# Patient Record
Sex: Male | Born: 1960 | Race: White | Hispanic: No | Marital: Married | Smoking: Former smoker
Health system: Southern US, Community
[De-identification: ages and names within clinical notes are randomized; demographics above are authoritative.]

## PROBLEM LIST (undated history)

## (undated) DIAGNOSIS — T8859XA Other complications of anesthesia, initial encounter: Secondary | ICD-10-CM

## (undated) DIAGNOSIS — F419 Anxiety disorder, unspecified: Secondary | ICD-10-CM

## (undated) DIAGNOSIS — T4145XA Adverse effect of unspecified anesthetic, initial encounter: Secondary | ICD-10-CM

## (undated) DIAGNOSIS — E782 Mixed hyperlipidemia: Secondary | ICD-10-CM

## (undated) DIAGNOSIS — I499 Cardiac arrhythmia, unspecified: Secondary | ICD-10-CM

## (undated) DIAGNOSIS — Z9889 Other specified postprocedural states: Secondary | ICD-10-CM

## (undated) DIAGNOSIS — I480 Paroxysmal atrial fibrillation: Secondary | ICD-10-CM

## (undated) DIAGNOSIS — R112 Nausea with vomiting, unspecified: Secondary | ICD-10-CM

## (undated) HISTORY — PX: CHOLECYSTECTOMY: SHX55

## (undated) HISTORY — DX: Cardiac arrhythmia, unspecified: I49.9

## (undated) HISTORY — PX: BACK SURGERY: SHX140

## (undated) HISTORY — PX: TEE WITH CARDIOVERSION: SHX5442

---
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
## 2008-01-13 ENCOUNTER — Ambulatory Visit (HOSPITAL_COMMUNITY): Admission: RE | Admit: 2008-01-13 | Discharge: 2008-01-13 | Payer: Self-pay | Admitting: Neurosurgery

## 2008-03-01 ENCOUNTER — Encounter (INDEPENDENT_AMBULATORY_CARE_PROVIDER_SITE_OTHER): Payer: Self-pay | Admitting: Orthopedic Surgery

## 2008-03-01 ENCOUNTER — Inpatient Hospital Stay (HOSPITAL_COMMUNITY): Admission: RE | Admit: 2008-03-01 | Discharge: 2008-03-02 | Payer: Self-pay | Admitting: Orthopedic Surgery

## 2011-02-04 NOTE — Op Note (Signed)
NAMESHALIK, Aaron [name] NO.:  [id number]   MEDICAL RECORD NO.:  [id number]          PATIENT TYPE:  INP   LOCATION:  5017                         FACILITY:  [facility]   PHYSICIAN:  Nelda Severe, MD      DATE OF BIRTH:  [birth year]   DATE OF PROCEDURE:  03/01/2008  DATE OF DISCHARGE:                               OPERATIVE REPORT   SURGEON:  Nelda Severe, MD   ASSISTANT:  Lianne Cure, PA-C   PREOPERATIVE DIAGNOSIS:  L4-5 disk herniation with right sciatic pain.   POSTOPERATIVE DIAGNOSIS:  L4-5 disk herniation with right sciatic pain.   PROCEDURE:  Right L4-5 laminotomy and disk excision.   OPERATIVE NOTE:  The patient was placed under general endotracheal  anesthesia.  Intravenous antibiotics had been infused.  He was  positioned kneeling on an [place] frame.  Care was taken to position the  upper extremities so as to avoid hyperflexion and abduction of the  shoulders and so as to avoid hyperflexion of the elbows.  Axillary rolls  were placed (foam) and the upper extremities were padded with foam from  axilla to hands.  The kneeling support from the tibias was covered with  a gel pad.   I marked with a skin marker.  A vertical midline incision centered on  the what I perceived to be the L4-5 interspinous interval.  Lumbar air  strip was prepared using DuraPrep and draped in rectangular fashion and  the drapes secured with Ioban.   A time-out was held.  The patient was identified, the preoperative  diagnosis, intended procedure, and allergies were identified.   The skin was incised into the dermis and then the subcutaneous tissue  injected with 0.25% plain Marcaine mixed with 1% lidocaine with  epinephrine.  Incision was then deepened using cutting current through  into the thoracolumbar fascia which was detached from the spinous  processes of what proved to be L4-L5 medially.  Paraspinal muscles were  mobilized bilaterally to the facet joint.  A Kocher was  attached to the  trailing edge of the upper vertebra.  A cross-table lateral radiograph  taken which positively identified as the fourth lumbar vertebra.   A Taylor retractor was placed lateral to the L4-5 facet joint.  Soft  tissue was cleared away from the interlaminar space.   I then performed a modest medial facetectomy (inferior articular  process) and laminotomy proximally using a high-speed bur.  Angle Carlen  curette was then used to detach the ligamentum flavum from the upper  edge of the L5 vertebra and from the medial edge of the superior  articular process of L5.  This allowed to a 3-mm Kerrison to be admitted  into the spinal canal and to perform a modest medial facetectomy of the  superior articular process.  Ligamentum flavum was then further removed  using Kerrison rongeurs.   The origin of the L5 nerve root was then mobilized off a very prominent  knuckle of L4-5 disk.  The disk was incised and a moderate mallet of  very degenerate nucleus pulposus (multiple small fragments)  exuded into  the laminotomy.  A nerve hook was used to deliver as much as possible  and then Apstein and Scoville down pushing curettes were used to push a  little more disk into the disk space and to loosen up some disk within  the disk space which was then delivered using pituitary rongeur.  The  disk space was irrigated.  The Love nerve root retractor was removed.  The nerve root was palpated and was not under any compression or  tension.  A few epidural bleeders were coagulated.  There was one very  small bleeder which was right on the L5 nerve root, which I elected not  to coagulate.  I attempted to control it by placing Gelfoam on it for a  period of about 5 minutes, it continued to ooze.  This was very mild  ooze indeed.  However, as a prophylactic measure, I placed a 15-gauge  Blake drain subfascially and secured with it a 2-0 nylon suture to the  skin on the right side.  The fascia was  then closed using interrupted  and continuous #1 Vicryl suture.  The subcutaneous layer was closed  using inverted 2-0 undyed Vicryl.  The skin was closed using  subcuticular running 3-0 undyed Vicryl.  The skin edges were reinforced  with Steri-Strips.  An antibiotic ointment dressing was applied and  secured with OpSite.   There were no intraoperative complications.  Sponge and needle counts  were correct.   At the time of dictation, the patient was not awakened, so no neurologic  exam is reported here.      Nelda Severe, MD  Electronically Signed     MT/MEDQ  D:  03/01/2008  T:  03/02/2008  Job:  [id number]

## 2011-02-07 NOTE — Discharge Summary (Signed)
NAMERIAAN, TOLEDO                 ACCOUNT NO.:  [id number]   MEDICAL RECORD NO.:  [id number]          PATIENT TYPE:  INP   LOCATION:  5017                         FACILITY:  [facility]   PHYSICIAN:  Nelda Severe, MD      DATE OF BIRTH:  05/04/1961   DATE OF ADMISSION:  03/01/2008  DATE OF DISCHARGE:  03/02/2008                               DISCHARGE SUMMARY   ADMITTING DIAGNOSIS:  Lumbar herniated disk, L4-L5 right-sided.   Postoperatively, the patient was stable.  Right leg felt significantly  better.  No focal weakness noted distally.  Neurovascularly, motor is  intact.  Postop day #1, at 8:20 a.m., the patient states right leg feels  significantly better.  He is afebrile.  Vital signs were stable.  Drain  output 20 mL.  Drains were discontinued.  Dry dressing was applied.  Incision healing well.  Clean dry dressing for 24-48 hours until no  drainage is recommended.   DISCHARGE DIAGNOSIS:  L4-L5 right-sided herniated disk, status post  discectomy.   DISPOSITION:  Stable.   DIET:  Regular.   We are going to discharge him home on Norco 10 two every 4 p.r.n. for  pain control, Robaxin 500 mg every 6 hours for muscle spasms, ambulate  for exercise, and follow up with [place] in the office in approximately 4  weeks.  If he has troubles or concerns prior to that, he is welcome to  call us at anytime.      Lianne Cure, P.A.       Nelda Severe, MD  Electronically Signed    MC/MEDQ  D:  04/18/2008  T:  04/19/2008  Job:  [id number]

## 2011-06-19 LAB — URINE CULTURE
Colony Count: NO GROWTH
Culture: NO GROWTH

## 2011-06-19 LAB — URINALYSIS, ROUTINE W REFLEX MICROSCOPIC
Glucose, UA: NEGATIVE
Ketones, ur: NEGATIVE
Nitrite: NEGATIVE
Specific Gravity, Urine: 1.031 — ABNORMAL HIGH
pH: 5.5

## 2011-06-19 LAB — COMPREHENSIVE METABOLIC PANEL
Alkaline Phosphatase: 63
BUN: 10
CO2: 31
Chloride: 103
Creatinine, Ser: 0.94
GFR calc non Af Amer: >60
Potassium: 4.6
Total Bilirubin: 0.6

## 2011-06-19 LAB — PROTIME-INR
INR: 0.9
Prothrombin Time: 12.7

## 2011-06-19 LAB — URINE MICROSCOPIC-ADD ON

## 2011-06-19 LAB — DIFFERENTIAL
Basophils Absolute: 0.1
Basophils Relative: 0
Eosinophils Relative: 1
Lymphocytes Relative: 19
Neutro Abs: 8.7 — ABNORMAL HIGH

## 2011-06-19 LAB — CBC
HCT: 47.9
Hemoglobin: 16.5
MCHC: 34.4
MCV: 90.6
Platelets: 235
RBC: 5.28
RDW: 13.7
WBC: 12.4 — ABNORMAL HIGH

## 2011-06-19 LAB — ABO/RH: ABO/RH(D): O POS

## 2011-06-19 LAB — TYPE AND SCREEN
ABO/RH(D): O POS
Antibody Screen: NEGATIVE

## 2011-06-19 LAB — APTT: aPTT: 30

## 2011-11-17 ENCOUNTER — Other Ambulatory Visit: Payer: Self-pay | Admitting: Orthopedic Surgery

## 2011-11-17 DIAGNOSIS — M545 Low back pain: Secondary | ICD-10-CM

## 2011-11-19 ENCOUNTER — Ambulatory Visit
Admission: RE | Admit: 2011-11-19 | Discharge: 2011-11-19 | Disposition: A | Discharge status: Home / Self Care | Payer: PRIVATE HEALTH INSURANCE | Source: Ambulatory Visit | Attending: Orthopedic Surgery | Admitting: Orthopedic Surgery

## 2011-11-19 DIAGNOSIS — M545 Low back pain: Secondary | ICD-10-CM

## 2011-11-19 MED ORDER — GADOBENATE DIMEGLUMINE 529 MG/ML IV SOLN
20.0000 mL | Freq: Once | INTRAVENOUS | Status: AC | PRN
  Administered 2011-11-19: 20 mL via INTRAVENOUS

## 2018-10-18 DIAGNOSIS — R112 Nausea with vomiting, unspecified: Secondary | ICD-10-CM | POA: Diagnosis not present

## 2018-10-18 DIAGNOSIS — Z6838 Body mass index (BMI) 38.0-38.9, adult: Secondary | ICD-10-CM | POA: Diagnosis not present

## 2018-10-18 DIAGNOSIS — K6289 Other specified diseases of anus and rectum: Secondary | ICD-10-CM | POA: Diagnosis not present

## 2018-10-29 DIAGNOSIS — Z6838 Body mass index (BMI) 38.0-38.9, adult: Secondary | ICD-10-CM | POA: Diagnosis not present

## 2018-10-29 DIAGNOSIS — R7303 Prediabetes: Secondary | ICD-10-CM | POA: Diagnosis not present

## 2018-10-29 DIAGNOSIS — Z87891 Personal history of nicotine dependence: Secondary | ICD-10-CM | POA: Diagnosis not present

## 2018-10-29 DIAGNOSIS — Z0000 Encounter for general adult medical examination without abnormal findings: Secondary | ICD-10-CM | POA: Diagnosis not present

## 2018-11-04 DIAGNOSIS — J209 Acute bronchitis, unspecified: Secondary | ICD-10-CM | POA: Diagnosis not present

## 2018-11-04 DIAGNOSIS — Z6838 Body mass index (BMI) 38.0-38.9, adult: Secondary | ICD-10-CM | POA: Diagnosis not present

## 2018-11-17 ENCOUNTER — Encounter: Payer: Self-pay | Admitting: Internal Medicine

## 2018-11-17 DIAGNOSIS — Z87891 Personal history of nicotine dependence: Secondary | ICD-10-CM | POA: Diagnosis not present

## 2019-01-05 ENCOUNTER — Institutional Professional Consult (permissible substitution): Payer: PRIVATE HEALTH INSURANCE | Admitting: Pulmonary Disease

## 2019-01-25 ENCOUNTER — Other Ambulatory Visit: Payer: Self-pay

## 2019-01-25 ENCOUNTER — Encounter: Payer: Self-pay | Admitting: Internal Medicine

## 2019-01-25 ENCOUNTER — Ambulatory Visit: Payer: 59 | Admitting: Internal Medicine

## 2019-01-25 VITALS — BP 126/74 | HR 68 | Temp 97.9°F | Ht 72.0 in | Wt 276.0 lb

## 2019-01-25 DIAGNOSIS — R918 Other nonspecific abnormal finding of lung field: Secondary | ICD-10-CM | POA: Diagnosis not present

## 2019-01-25 DIAGNOSIS — J449 Chronic obstructive pulmonary disease, unspecified: Secondary | ICD-10-CM

## 2019-01-25 NOTE — Patient Instructions (Signed)
Weight control is simply a matter of calorie balance which needs to be tilted in your favor by eating less and exercising more.  To get the most out of exercise, you need to be continuously aware that you are short of breath, but never out of breath, for 30 minutes daily. As you improve, it will actually be easier for you to do the same amount of exercise  in  30 minutes so always push to the level where you are short of breath.  If this does not result in gradual weight reduction then I strongly recommend you see a nutritionist with a food diary x 2 weeks so that we can work out a negative calorie balance which is universally effective in steady weight loss programs.  Think of your calorie balance like you do your bank account where in this case you want the balance to go down so you must take in less calories than you burn up.  It's just that simple:  Hard to do, but easy to understand.  Good luck!    Return after Labor day for full pfts and to review your follow up CT

## 2019-01-25 NOTE — Progress Notes (Signed)
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 Pulmonary Consult    Referred by Laverna Peace, NP. Pt c/o DOE for the past year. He gets winded walking up hill or stairs.   Dyspnea:  MMRC2 = can't walk a nl pace on a flat grade s sob but does fine slow and flat / one flight sob at top s stopping / still doing yardwork for 10 min Cough: none Sleep: on side / bed is flat/ one pillow  SABA use: none    No obvious day to day or daytime variability or assoc excess/ purulent sputum or mucus plugs or hemoptysis or cp or chest tightness, subjective wheeze or overt sinus or hb symptoms.   Sleeping as above without nocturnal  or early am exacerbation  of respiratory  c/o's or need for noct saba. Also denies any obvious fluctuation of symptoms with weather or environmental changes or other aggravating or alleviating factors except as outlined above   No unusual exposure hx or h/o childhood pna/ asthma or knowledge of premature birth.  Current Allergies, Complete Past Medical History, Past Surgical History, Family History, and Social History were reviewed in [employer] record.  ROS  The following are not active complaints unless bolded Hoarseness, sore throat, dysphagia, dental problems, itching, sneezing,  nasal congestion or discharge of excess mucus or purulent secretions, ear ache,   fever, chills, sweats, unintended wt loss or wt gain, classically  pleuritic or exertional cp,  orthopnea pnd or arm/hand swelling  or leg swelling, presyncope, palpitations, abdominal pain, anorexia, nausea, vomiting, diarrhea  or change in bowel habits or change in bladder habits, change in stools or change in urine, dysuria, hematuria,  rash, arthralgias, visual complaints, headache, numbness, weakness or ataxia or problems with walking or coordination,  change in mood= anxious or  memory.             No past medical history on file.  Outpatient Medications Prior to Visit  Medication Sig Dispense Refill  . atorvastatin (LIPITOR) 10 MG tablet Take 10 mg by mouth daily.    . temazepam (RESTORIL) 15 MG capsule Take 1 capsule by mouth daily.    Marland Kitchen escitalopram (LEXAPRO) 10 MG tablet Take 1 tablet by mouth daily.        Objective:     BP 126/74 (BP Location: Left Arm, Cuff Size: Normal)   Pulse 68   Temp 97.9 F (36.6 C) (Oral)   Ht 6' (1.829 m)   Wt 276 lb (125.2 kg)   SpO2 99%   BMI 37.43 kg/m   SpO2: 99 %  RA  Pleasant amb wm nad   HEENT: nl   oropharynx. Nl external ear canals without cough reflex -  Mild bilateral non-specific turbinate edema     NECK :  without JVD/Nodes/TM/ nl carotid upstrokes bilaterally   LUNGS: no acc muscle use,  Mild barrel  contour chest wall with bilateral  Distant bs s audible wheeze and  without cough on insp or exp maneuver and mild  Hyperresonant  to  percussion bilaterally     CV:  RRR  no s3 or murmur or increase in P2, and no edema   ABD:  soft and nontender with pos end  insp Hoover's  in the supine position. No bruits or organomegaly appreciated, bowel sounds nl  MS:   Nl gait/  ext warm without deformities, calf tenderness, cyanosis or clubbing No obvious joint restrictions   SKIN: warm and dry without lesions    NEURO:  alert, approp, nl sensorium with  no motor or cerebellar deficits apparent.     I personally reviewed images and agree with radiology impression as follows:   Chest CT  as above from 11/17/2018         Assessment   COPD  by ldsct criteria only  Quit smoking 2015 at wt = 170 lb  - 01/25/2019   Walked RA  2 laps @  approx 210ft each @ fast pace  stopped due to  [place] at wt = 276     I reviewed the Fletcher curve with the patient that basically indicates  if you quit smoking when your best day FEV1 is still well preserved (as is likely   the case here)  it is highly unlikely you will progress to severe disease and informed the patient there was  no medication on the market that has proven to alter the curve/ its downward trajectory  or the likelihood of progression of their disease(unlike other chronic medical conditions such as atheroclerosis where we do think we can change the natural hx with risk reducing meds like his lipitor)    Therefore stopping smoking and maintaining abstinence are  the most important aspects of care, not choice of inhalers or for that matter, doctors.   Treatment other than smoking cessation  is entirely directed by severity of symptoms and focused also on reducing exacerbations, not attempting to change the natural history of the disease.  He might be a candidate for a trial of either a lama   or Lama/laba combination but is not interested at this point.  I have asked him to return for full PFTs after  COVID - 19 restrictions have been lifted.       Abnormal CT lung screening CT results reviewed with pt >>> Too small for PET or bx, not suspicious enough for excisional bx > really only option for now is follow the Fleischner society guidelines as rec by radiology.   As for the tree in bud changes, This is an extremely common benign condition and does not warrant aggressive eval/ rx at this point unless there is a clinical correlation suggesting unaddressed pulmonary infection (purulent sputum, night sweats, unintended wt loss, doe) or evolution of  obvious changes on plain cxr (as opposed to serial CT, which is way over sensitive to  make clinical decisions re intervention.  Since needs CT for SPN can ollow-up this also in 6 months as planned but would not act on changes that are only seen on CT chest s any clinical correlation.   Discussed in detail all the  indications, usual  risks and alternatives  relative to the benefits with patient who agrees to proceed with conservative f/u as outlined          Total time devoted to counseling  > 50 % of  initial 60 min office visit:  review case with pt/ directly observed portions of ambulatory 02 saturation study/  discussion of options/alternatives/ personally creating written customized instructions  in presence of pt  then going over those specific  Instructions directly with the pt including how to use all of the meds but in particular covering each new medication in detail and the difference between the maintenance= "automatic" meds and the prns using an action plan format for the latter (If this problem/symptom => do that organization reading Left to right).  Please see AVS from this visit for a full list of these instructions which I personally wrote for this pt and  are unique to this visit.      Christinia Gully, MD 01/25/2019

## 2019-01-26 ENCOUNTER — Encounter: Payer: Self-pay | Admitting: Internal Medicine

## 2019-01-26 DIAGNOSIS — R918 Other nonspecific abnormal finding of lung field: Secondary | ICD-10-CM

## 2019-01-26 DIAGNOSIS — J449 Chronic obstructive pulmonary disease, unspecified: Secondary | ICD-10-CM

## 2019-01-26 DIAGNOSIS — R911 Solitary pulmonary nodule: Secondary | ICD-10-CM | POA: Insufficient documentation

## 2019-01-26 HISTORY — DX: Solitary pulmonary nodule: R91.1

## 2019-01-26 HISTORY — DX: Chronic obstructive pulmonary disease, unspecified: J44.9

## 2019-01-26 NOTE — Assessment & Plan Note (Signed)
CT results reviewed with pt >>> Too small for PET or bx, not suspicious enough for excisional bx > really only option for now is follow the Fleischner society guidelines as rec by radiology.   As for the tree in bud changes, This is an extremely common benign condition and does not warrant aggressive eval/ rx at this point unless there is a clinical correlation suggesting unaddressed pulmonary infection (purulent sputum, night sweats, unintended wt loss, doe) or evolution of  obvious changes on plain cxr (as opposed to serial CT, which is way over sensitive to make clinical decisions re intervention.  Since needs CT for SPN can ollow-up this also in 6 months as planned but would not act on changes that are only seen on CT chest s any clinical correlation.   Discussed in detail all the  indications, usual  risks and alternatives  relative to the benefits with patient who agrees to proceed with conservative f/u as outlined       Total time devoted to counseling  > 50 % of initial 60 min office visit:  review case with pt/ directly observed portions of ambulatory 02 saturation study/  discussion of options/alternatives/ personally creating written customized instructions  in presence of pt  then going over those specific  Instructions directly with the pt including how to use all of the meds but in particular covering each new medication in detail and the difference between the maintenance= "automatic" meds and the prns using an action plan format for the latter (If this problem/symptom => do that organization reading Left to right).  Please see AVS from this visit for a full list of these instructions which I personally wrote for this pt and  are unique to this visit.

## 2019-01-26 NOTE — Assessment & Plan Note (Addendum)
Quit smoking 2015 at wt = 170 lb  - 01/25/2019   Walked RA  2 laps @  approx 234ft each @ fast pace  stopped due to  [place] at wt = 276     I reviewed the Fletcher curve with the patient that basically indicates  if you quit smoking when your best day FEV1 is still well preserved (as is likely   the case here)  it is highly unlikely you will progress to severe disease and informed the patient there was  no medication on the market that has proven to alter the curve/ its downward trajectory  or the likelihood of progression of their disease(unlike other chronic medical conditions such as atheroclerosis where we do think we can change the natural hx with risk reducing meds like his lipitor)    Therefore stopping smoking and maintaining abstinence are  the most important aspects of care, not choice of inhalers or for that matter, doctors.   Treatment other than smoking cessation  is entirely directed by severity of symptoms and focused also on reducing exacerbations, not attempting to change the natural history of the disease.  He might be a candidate for a trial of either a lama or lama/laba combination but is not interested at this point.  I have asked him to return for full PFTs after  COVID - 19 restrictions have been lifted.

## 2019-02-08 DIAGNOSIS — I4891 Unspecified atrial fibrillation: Secondary | ICD-10-CM

## 2019-02-08 DIAGNOSIS — R079 Chest pain, unspecified: Secondary | ICD-10-CM | POA: Diagnosis not present

## 2019-02-08 DIAGNOSIS — R002 Palpitations: Secondary | ICD-10-CM | POA: Diagnosis not present

## 2019-02-09 DIAGNOSIS — I4891 Unspecified atrial fibrillation: Secondary | ICD-10-CM

## 2019-02-11 DIAGNOSIS — Z79899 Other long term (current) drug therapy: Secondary | ICD-10-CM | POA: Diagnosis not present

## 2019-02-11 DIAGNOSIS — Z6837 Body mass index (BMI) 37.0-37.9, adult: Secondary | ICD-10-CM | POA: Diagnosis not present

## 2019-02-11 DIAGNOSIS — I4891 Unspecified atrial fibrillation: Secondary | ICD-10-CM | POA: Diagnosis not present

## 2019-02-16 ENCOUNTER — Other Ambulatory Visit: Payer: Self-pay

## 2019-02-16 ENCOUNTER — Ambulatory Visit (INDEPENDENT_AMBULATORY_CARE_PROVIDER_SITE_OTHER): Payer: 59 | Admitting: Cardiology

## 2019-02-16 ENCOUNTER — Encounter: Payer: Self-pay | Admitting: Cardiology

## 2019-02-16 VITALS — BP 118/72 | HR 65 | Ht 72.0 in | Wt 280.0 lb

## 2019-02-16 DIAGNOSIS — G4700 Insomnia, unspecified: Secondary | ICD-10-CM | POA: Insufficient documentation

## 2019-02-16 DIAGNOSIS — Z7901 Long term (current) use of anticoagulants: Secondary | ICD-10-CM

## 2019-02-16 DIAGNOSIS — Z5181 Encounter for therapeutic drug level monitoring: Secondary | ICD-10-CM | POA: Diagnosis not present

## 2019-02-16 DIAGNOSIS — I4891 Unspecified atrial fibrillation: Secondary | ICD-10-CM | POA: Diagnosis not present

## 2019-02-16 HISTORY — DX: Insomnia, unspecified: G47.00

## 2019-02-16 NOTE — Progress Notes (Signed)
Cardiology Office Note:    Date:  02/16/2019   ID:  Aaron [name], DOB 05/21/1961, MRN [id number]  PCP:  Lowella Dandy, NP  Cardiologist:  Jenean Lindau, MD   Referring MD: Lowella Dandy, NP    ASSESSMENT:    1. Atrial fibrillation, unspecified type (Central)    PLAN:    In order of problems listed above:  1. I discussed my findings with the patient at extensive length.I discussed with the patient atrial fibrillation, disease process. Management and therapy including rate and rhythm control, anticoagulation benefits and potential risks were discussed extensively with the patient. Patient had multiple questions which were answered to patient's satisfaction.  Patient's [place] score is 0 but he is now on anticoagulation with Eliquis 5 mg twice daily.  Echocardiogram will be done to assess systolic function and left atrial size with review on rhythm control.  If his cardiac anatomy is well preserved and left atrial sizes acceptable then we will initiate him on propafenone in 2 weeks from now.  This will hopefully give him a chance for a pharmacological cardioversion.  I discussed with him that if this is unsuccessful he might need electrocardioversion as the next heparin he vocalized understanding.  He had multiple questions which were answered to his satisfaction.  I would also like to give him a ifob as he is on anticoagulation to check stool for occult blood.  He knows to go to the nearest emergency room for any concerning symptoms.   Medication Adjustments/Labs and Tests Ordered: Current medicines are reviewed at length with the patient today.  Concerns regarding medicines are outlined above.  Orders Placed This Encounter  Procedures  . EKG 12-Lead   No orders of the defined types were placed in this encounter.    History of Present Illness:    Aaron [name] is a 58 y.o. male who is being seen today for the evaluation of newly diagnosed atrial fibrillation at the request of Moon, [name] A, NP.   Patient is a pleasant 58 year old male.  He denies any past medical history of essential hypertension dyslipidemia or diabetes mellitus.  He felt that his heart rate was elevated and he was getting exhausted and went to the emergency room about a week ago.  He was admitted.  He underwent stress testing which was negative for ischemia.  No echo report was available and I do not think an echo was done.  Subsequently he was discharged home.  Patient mentions to me that at rest he has no symptoms.  But with ambulation he develops fatigue.  At the time of my evaluation, the patient is alert awake oriented and in no distress.  History reviewed. No pertinent past medical history.  Past Surgical History:  Procedure Laterality Date  . BACK SURGERY    . CHOLECYSTECTOMY      Current Medications: Current Meds  Medication Sig  . atorvastatin (LIPITOR) 10 MG tablet Take 10 mg by mouth daily.  Marland Kitchen diltiazem (CARDIZEM) 60 MG tablet Take 60 mg by mouth every 12 (twelve) hours.  Marland Kitchen ELIQUIS 5 MG TABS tablet Take 5 mg by mouth 2 (two) times daily.  Marland Kitchen escitalopram (LEXAPRO) 10 MG tablet Take 1 tablet by mouth daily.  . temazepam (RESTORIL) 15 MG capsule Take 1 capsule by mouth daily.     Allergies:   Codeine and Penicillins   Social History   Socioeconomic History  . Marital status: Married    Spouse name: Not on file  .  Number of children: Not on file  . Years of education: Not on file  . Highest education level: Not on file  Occupational History  . Not on file  Social Needs  . Financial resource strain: Not on file  . Food insecurity:    Worry: Not on file    Inability: Not on file  . Transportation needs:    Medical: Not on file    Non-medical: Not on file  Tobacco Use  . Smoking status: Former Smoker    Packs/day: 1.50    Years: 40.00    Pack years: 60.00    Types: Cigarettes    Last attempt to quit: 09/22/2013    Years since quitting: 5.4  . Smokeless tobacco: Never Used  Substance and  Sexual Activity  . Alcohol use: Not on file  . Drug use: Not on file  . Sexual activity: Not on file  Lifestyle  . Physical activity:    Days per week: Not on file    Minutes per session: Not on file  . Stress: Not on file  Relationships  . Social connections:    Talks on phone: Not on file    Gets together: Not on file    Attends religious service: Not on file    Active member of club or organization: Not on file    Attends meetings of clubs or organizations: Not on file    Relationship status: Not on file  Other Topics Concern  . Not on file  Social History Narrative  . Not on file     Family History: The patient's family history includes Lung cancer in his brother, father, mother, and sister.  ROS:   Please see the history of present illness.    All other systems reviewed and are negative.  EKGs/Labs/Other Studies Reviewed:    The following studies were reviewed today: I reviewed [facility] records extensively.  Lexiscan sestamibi done on 02/08/2019 reveals no evidence of ischemia and normal ejection fraction. EKG done today reveals atrial fibrillation with elevated ventricular rate of 102.  Recent Labs: No results found for requested labs within last 8760 hours.  Recent Lipid Panel No results found for: CHOL, TRIG, HDL, CHOLHDL, VLDL, LDLCALC, LDLDIRECT  Physical Exam:    VS:  BP 118/72 (BP Location: Left Arm, Patient Position: Sitting, Cuff Size: Normal)   Pulse 65   Ht 6' (1.829 m)   Wt 280 lb (127 kg)   SpO2 98%   BMI 37.97 kg/m     Wt Readings from Last 3 Encounters:  02/16/19 280 lb (127 kg)  01/25/19 276 lb (125.2 kg)     GEN: Patient is in no acute distress HEENT: Normal NECK: No JVD; No carotid bruits LYMPHATICS: No lymphadenopathy CARDIAC: S1 S2 regular, 2/6 systolic murmur at the apex. RESPIRATORY:  Clear to auscultation without rales, wheezing or rhonchi  ABDOMEN: Soft, non-tender, non-distended MUSCULOSKELETAL:  No edema; No  deformity  SKIN: Warm and dry NEUROLOGIC:  Alert and oriented x 3 PSYCHIATRIC:  Normal affect    Signed, Jenean Lindau, MD  02/16/2019 10:39 AM    [place]

## 2019-02-16 NOTE — Patient Instructions (Addendum)
Medication Instructions:  Your physician recommends that you continue on your current medications as directed. Please refer to the Current Medication list given to you today.  If you need a refill on your cardiac medications before your next appointment, please call your pharmacy.   Lab work: You will be given an IFOB test to take home with instructions. If you have labs (blood work) drawn today and your tests are completely normal, you will receive your results only by: Marland Kitchen MyChart Message (if you have MyChart) OR . A paper copy in the mail If you have any lab test that is abnormal or we need to change your treatment, we will call you to review the results.  Testing/Procedures: You had an EKG performed today.  Your physician has requested that you have an echocardiogram. YOU HAVE been scheduled for March 07, 2019 at 3:15 pm. Echocardiography is a painless test that uses sound waves to create images of your heart. It provides your doctor with information about the size and shape of your heart and how well your heart's chambers and valves are working. This procedure takes approximately one hour. There are no restrictions for this procedure.    Follow-Up: At [facility], you and your health needs are our priority.  As part of our continuing mission to provide you with exceptional heart care, we have created designated Provider Care Teams.  These Care Teams include your primary Cardiologist (physician) and Advanced Practice Providers (APPs -  Physician Assistants and Nurse Practitioners) who all work together to provide you with the care you need, when you need it. You will need a follow up appointment in 2 weeks.

## 2019-02-18 DIAGNOSIS — I4891 Unspecified atrial fibrillation: Secondary | ICD-10-CM | POA: Diagnosis not present

## 2019-02-18 DIAGNOSIS — Z6837 Body mass index (BMI) 37.0-37.9, adult: Secondary | ICD-10-CM | POA: Diagnosis not present

## 2019-02-19 LAB — FECAL OCCULT BLOOD, IMMUNOCHEMICAL: Fecal Occult Bld: POSITIVE — AB

## 2019-02-25 ENCOUNTER — Telehealth: Payer: Self-pay

## 2019-02-25 NOTE — Telephone Encounter (Signed)
Patient informed and will await call from Dr. Louann Sjogren office for appt. RN called Aaron [name] at Dr. Louann Sjogren office to evaluate discontinuing Eliquis.Informed to send office note to Dr. Docia Furl attention after judgement has been made concerning discontinuation of Eliquis.

## 2019-02-25 NOTE — Telephone Encounter (Signed)
-----   Message from Jenean Lindau, MD sent at 02/21/2019  1:28 PM EDT ----- Please let the patient know of the results.  Please contact primary care doctor's nurse and let them know that they need to evaluate him.  We need to primary care to advise [place] whether we need to stop Eliquis.  He does not have to be on Eliquis and if necessary we can stop it.  Please do this and get back to me.  Thank you. ----- Message ----- From: Lavone Neri Lab Results In Sent: 02/19/2019  11:36 AM EDT To: Jenean Lindau, MD

## 2019-03-07 ENCOUNTER — Ambulatory Visit (INDEPENDENT_AMBULATORY_CARE_PROVIDER_SITE_OTHER): Payer: 59

## 2019-03-07 ENCOUNTER — Other Ambulatory Visit: Payer: Self-pay

## 2019-03-07 DIAGNOSIS — I4891 Unspecified atrial fibrillation: Secondary | ICD-10-CM

## 2019-03-07 NOTE — Progress Notes (Signed)
2D Echocardiogram performed  03/07/19 Aaron [name]

## 2019-03-09 ENCOUNTER — Other Ambulatory Visit: Payer: Self-pay

## 2019-03-09 ENCOUNTER — Telehealth (INDEPENDENT_AMBULATORY_CARE_PROVIDER_SITE_OTHER): Payer: 59 | Admitting: Cardiology

## 2019-03-09 ENCOUNTER — Telehealth: Payer: Self-pay | Admitting: *Deleted

## 2019-03-09 ENCOUNTER — Encounter: Payer: Self-pay | Admitting: Cardiology

## 2019-03-09 VITALS — Ht 72.0 in | Wt 175.0 lb

## 2019-03-09 DIAGNOSIS — I4891 Unspecified atrial fibrillation: Secondary | ICD-10-CM

## 2019-03-09 NOTE — Progress Notes (Signed)
Virtual Visit via Video Note   This visit type was conducted due to national recommendations for restrictions regarding the COVID-19 Pandemic (e.g. social distancing) in an effort to limit this patient's exposure and mitigate transmission in our community.  Due to his co-morbid illnesses, this patient is at least at moderate risk for complications without adequate follow up.  This format is felt to be most appropriate for this patient at this time.  All issues noted in this document were discussed and addressed.  A limited physical exam was performed with this format.  Please refer to the patient's chart for his consent to telehealth for [facility]   Date:  03/09/2019   ID:  Aaron [name], DOB 01-17-1961, MRN [id number]  Patient Location: Home Provider Location: Home  PCP:  Lowella Dandy, NP  Cardiologist:  No primary care provider on file.  Electrophysiologist:  None   Evaluation Performed:  Follow-Up Visit  Chief Complaint: Atrial fibrillation  History of Present Illness:    Aaron [name] is a 58 y.o. male with recently diagnosed atrial fibrillation.  Patient was in the hospital stress testing which was unremarkable.  There was no evidence of ischemia and ejection fraction was normal.  Echocardiogram done yesterday has revealed preserved systolic function and mild left atrial enlargement.  Patient mentions to me that when he exerts himself he feels short of breath.  At the time of my evaluation, the patient is alert awake oriented and in no distress.  The patient does not have symptoms concerning for COVID-19 infection (fever, chills, cough, or new shortness of breath).    History reviewed. No pertinent past medical history. Past Surgical History:  Procedure Laterality Date   BACK SURGERY     CHOLECYSTECTOMY       Current Meds  Medication Sig   atorvastatin (LIPITOR) 10 MG tablet Take 10 mg by mouth daily.   diltiazem (CARDIZEM) 60 MG tablet Take 60 mg by mouth every 12  (twelve) hours.   ELIQUIS 5 MG TABS tablet Take 5 mg by mouth 2 (two) times daily.   escitalopram (LEXAPRO) 10 MG tablet Take 1 tablet by mouth daily.   temazepam (RESTORIL) 15 MG capsule Take 1 capsule by mouth daily.     Allergies:   Codeine and Penicillins   Social History   Tobacco Use   Smoking status: Former Smoker    Packs/day: 1.50    Years: 40.00    Pack years: 60.00    Types: Cigarettes    Quit date: 09/22/2013    Years since quitting: 5.4   Smokeless tobacco: Never Used  Substance Use Topics   Alcohol use: Not on file   Drug use: Not on file     Family Hx: The patient's family history includes Lung cancer in his brother, father, mother, and sister.  ROS:   Please see the history of present illness.    As mentioned above All other systems reviewed and are negative.   Prior CV studies:   The following studies were reviewed today:  IMPRESSIONS    1. The left ventricle has normal systolic function, with an ejection fraction of 55-60%. The cavity size was normal. Left ventricular diastolic Doppler parameters are indeterminate.  2. The right ventricle has normal systolic function. The cavity was normal. There is no increase in right ventricular wall thickness.  3. Left atrial size was moderately dilated.  4. Right atrial size was mildly dilated.  5. The aortic valve has an indeterminate number of  cusps. Aortic valve regurgitation was not assessed by color flow Doppler.  Labs/Other Tests and Data Reviewed:    EKG:  No ECG reviewed.  Recent Labs: No results found for requested labs within last 8760 hours.   Recent Lipid Panel No results found for: CHOL, TRIG, HDL, CHOLHDL, LDLCALC, LDLDIRECT  Wt Readings from Last 3 Encounters:  03/09/19 175 lb (79.4 kg)  02/16/19 280 lb (127 kg)  01/25/19 276 lb (125.2 kg)     Objective:    Vital Signs:  Ht 6' (1.829 m)    Wt 175 lb (79.4 kg)    BMI 23.73 kg/m    VITAL SIGNS:  reviewed  ASSESSMENT & PLAN:     1. Atrial fibrillation:I discussed with the patient atrial fibrillation, disease process. Management and therapy including rate and rhythm control, anticoagulation benefits and potential risks were discussed extensively with the patient. Patient had multiple questions which were answered to patient's satisfaction. 2. The patient's heart rates are fairly well controlled.  He does have shortness of breath on exertion.  Echocardiogram was discussed in detail with the patient.  His stools were positive for occult blood however his primary care physician has asked him to continue his anticoagulation because he has hemorrhoids and most likely this was the reason for blood in the stools.  I respect their wishes.  Patient will continue current medications.  I will obtain copy of lab work from primary care physician's office and begin the patient on antiarrhythmic medications.  Accordingly advice will be given to the patient.  He will be seen in follow-up appointment in 2 weeks or earlier if he has any concerns.  COVID-19 Education: The signs and symptoms of COVID-19 were discussed with the patient and how to seek care for testing (follow up with PCP or arrange E-visit).  The importance of social distancing was discussed today.  Time:   Today, I have spent 15 minutes with the patient with telehealth technology discussing the above problems.     Medication Adjustments/Labs and Tests Ordered: Current medicines are reviewed at length with the patient today.  Concerns regarding medicines are outlined above.   Tests Ordered: No orders of the defined types were placed in this encounter.   Medication Changes: No orders of the defined types were placed in this encounter.   Follow Up:  Virtual Visit or In Person in 2 week(s)  Signed, Jenean Lindau, MD  03/09/2019 8:48 AM    [place]

## 2019-03-09 NOTE — Patient Instructions (Addendum)
Medication Instructions:  Your physician has recommended you make the following change in your medication:   START taking flecainide 50 mg (1 tablet) twice daily.   If you need a refill on your cardiac medications before your next appointment, please call your pharmacy.   Lab work: NONE If you have labs (blood work) drawn today and your tests are completely normal, you will receive your results only by: Marland Kitchen MyChart Message (if you have MyChart) OR . A paper copy in the mail If you have any lab test that is abnormal or we need to change your treatment, we will call you to review the results.  Testing/Procedures: You will need to come in on 03/14/19 at 9:30 to have an EKG performed.   Follow-Up: At [facility], you and your health needs are our priority.  As part of our continuing mission to provide you with exceptional heart care, we have created designated Provider Care Teams.  These Care Teams include your primary Cardiologist (physician) and Advanced Practice Providers (APPs -  Physician Assistants and Nurse Practitioners) who all work together to provide you with the care you need, when you need it. You will need a follow up appointment in 2 weeks.    Any Other Special Instructions Will Be Listed Below    Flecainide tablets What is this medicine? FLECAINIDE (FLEK a nide) is an antiarrhythmic drug. This medicine is used to prevent irregular heart rhythm. It can also slow down fast heartbeats called tachycardia. This medicine may be used for other purposes; ask your health care provider or pharmacist if you have questions. COMMON BRAND NAME(S): Tambocor What should I tell my health care provider before I take this medicine? They need to know if you have any of these conditions: -abnormal levels of potassium in the blood -heart disease including heart rhythm and heart rate problems -kidney or liver disease -recent heart attack -an unusual or allergic reaction to flecainide,  local anesthetics, other medicines, foods, dyes, or preservatives -pregnant or trying to get pregnant -breast-feeding How should I use this medicine? Take this medicine by mouth with a glass of water. Follow the directions on the prescription label. You can take this medicine with or without food. Take your doses at regular intervals. Do not take your medicine more often than directed. Do not stop taking this medicine suddenly. This may cause serious, heart-related side effects. If your doctor wants you to stop the medicine, the dose may be slowly lowered over time to avoid any side effects. Talk to your pediatrician regarding the use of this medicine in children. While this drug may be prescribed for children as young as 1 year of age for selected conditions, precautions do apply. Overdosage: If you think you have taken too much of this medicine contact a poison control center or emergency room at once. NOTE: This medicine is only for you. Do not share this medicine with others. What if I miss a dose? If you miss a dose, take it as soon as you can. If it is almost time for your next dose, take only that dose. Do not take double or extra doses. What may interact with this medicine? Do not take this medicine with any of the following medications: -amoxapine -arsenic trioxide -certain antibiotics like clarithromycin, erythromycin, gatifloxacin, gemifloxacin, levofloxacin, moxifloxacin, sparfloxacin, or troleandomycin -certain antidepressants called tricyclic antidepressants like amitriptyline, imipramine, or nortriptyline -certain medicines to control heart rhythm like disopyramide, dofetilide, encainide, moricizine, procainamide, propafenone, and quinidine -cisapride -cyclobenzaprine -delavirdine -droperidol -haloperidol -  hawthorn -imatinib -levomethadyl -maprotiline -medicines for malaria like chloroquine and halofantrine -pentamidine -phenothiazines like chlorpromazine, mesoridazine,  prochlorperazine, thioridazine -pimozide -quinine -ranolazine -ritonavir -sertindole -ziprasidone This medicine may also interact with the following medications: -cimetidine -medicines for angina or high blood pressure -medicines to control heart rhythm like amiodarone and digoxin This list may not describe all possible interactions. Give your health care provider a list of all the medicines, herbs, non-prescription drugs, or dietary supplements you use. Also tell them if you smoke, drink alcohol, or use illegal drugs. Some items may interact with your medicine. What should I watch for while using this medicine? Visit your doctor or health care professional for regular checks on your progress. Because your condition and the use of this medicine carries some risk, it is a good idea to carry an identification card, necklace or bracelet with details of your condition, medications and doctor or health care professional. Check your blood pressure and pulse rate regularly. Ask your health care professional what your blood pressure and pulse rate should be, and when you should contact him or her. Your doctor or health care professional also may schedule regular blood tests and electrocardiograms to check your progress. You may get drowsy or dizzy. Do not drive, use machinery, or do anything that needs mental alertness until you know how this medicine affects you. Do not stand or sit up quickly, especially if you are an older patient. This reduces the risk of dizzy or fainting spells. Alcohol can make you more dizzy, increase flushing and rapid heartbeats. Avoid alcoholic drinks. What side effects may I notice from receiving this medicine? Side effects that you should report to your doctor or health care professional as soon as possible: -chest pain, continued irregular heartbeats -difficulty breathing -swelling of the legs or feet -trembling, shaking -unusually weak or tired Side effects that usually  do not require medical attention (report to your doctor or health care professional if they continue or are bothersome): -blurred vision -constipation -headache -nausea, vomiting -stomach pain This list may not describe all possible side effects. Call your doctor for medical advice about side effects. You may report side effects to FDA at 1-800-FDA-1088. Where should I keep my medicine? Keep out of the reach of children. Store at room temperature between 15 and 30 degrees C (59 and 86 degrees F). Protect from light. Keep container tightly closed. Throw away any unused medicine after the expiration date. NOTE: This sheet is a summary. It may not cover all possible information. If you have questions about this medicine, talk to your doctor, pharmacist, or health care provider.  2019 Elsevier/Gold Standard (2008-01-12 16:46:09)

## 2019-03-09 NOTE — Telephone Encounter (Signed)
Pt called to talk to nurse about visit today. Thought he was going to be put on mediation for heart rhythm. Please call and go over visit with patient.

## 2019-03-10 ENCOUNTER — Encounter: Payer: Self-pay | Admitting: *Deleted

## 2019-03-10 MED ORDER — FLECAINIDE ACETATE 50 MG PO TABS: 50.0000 mg | ORAL_TABLET | Freq: Two times a day (BID) | ORAL | 3 refills | Status: DC

## 2019-03-10 NOTE — Addendum Note (Signed)
Addended by: Beckey Rutter on: 03/10/2019 02:15 PM   Modules accepted: Orders

## 2019-03-14 ENCOUNTER — Encounter: Payer: Self-pay | Admitting: Cardiology

## 2019-03-14 ENCOUNTER — Ambulatory Visit (INDEPENDENT_AMBULATORY_CARE_PROVIDER_SITE_OTHER): Payer: 59 | Admitting: Cardiology

## 2019-03-14 ENCOUNTER — Other Ambulatory Visit: Payer: Self-pay

## 2019-03-14 VITALS — BP 110/62 | Ht 72.0 in | Wt 175.0 lb

## 2019-03-14 DIAGNOSIS — I4891 Unspecified atrial fibrillation: Secondary | ICD-10-CM

## 2019-03-14 LAB — BASIC METABOLIC PANEL
BUN/Creatinine Ratio: 18 (ref 9–20)
BUN: 17 mg/dL (ref 6–24)
CO2: 22 mmol/L (ref 20–29)
Calcium: 8.8 mg/dL (ref 8.7–10.2)
Chloride: 101 mmol/L (ref 96–106)
Creatinine, Ser: 0.93 mg/dL (ref 0.76–1.27)
GFR calc Af Amer: 104 mL/min/{1.73_m2} (ref >59)
GFR calc non Af Amer: 90 mL/min/{1.73_m2} (ref >59)
Glucose: 112 mg/dL — ABNORMAL HIGH (ref 65–99)
Potassium: 4.1 mmol/L (ref 3.5–5.2)
Sodium: 139 mmol/L (ref 134–144)

## 2019-03-14 MED ORDER — FLECAINIDE ACETATE 50 MG PO TABS: 100.0000 mg | ORAL_TABLET | Freq: Two times a day (BID) | ORAL | 3 refills | Status: DC

## 2019-03-14 NOTE — Patient Instructions (Addendum)
Medication Instructions:  INCREASE flecainide to 100 mg (2 tablets) twice daily   If you need a refill on your cardiac medications before your next appointment, please call your pharmacy.   Lab work: Your physician recommends that you have a bmp drawn  If you have labs (blood work) drawn today and your tests are completely normal, you will receive your results only by: Marland Kitchen MyChart Message (if you have MyChart) OR . A paper copy in the mail If you have any lab test that is abnormal or we need to change your treatment, we will call you to review the results.  Testing/Procedures: You had an EKG performed today.  Follow-Up: At [facility], you and your health needs are our priority.  As part of our continuing mission to provide you with exceptional heart care, we have created designated Provider Care Teams.  These Care Teams include your primary Cardiologist (physician) and Advanced Practice Providers (APPs -  Physician Assistants and Nurse Practitioners) who all work together to provide you with the care you need, when you need it. . YOU are scheduled for repeat EKG on 03/17/19 at 0840 AM

## 2019-03-14 NOTE — Progress Notes (Signed)
Cardiology Office Note:    Date:  03/14/2019   ID:  Aaron [name], DOB 04-12-1961, MRN [id number]  PCP:  Lowella Dandy, NP  Cardiologist:  Jenean Lindau, MD   Referring MD: Lowella Dandy, NP    ASSESSMENT:    1. Atrial fibrillation, unspecified type ([place])    PLAN:    In order of problems listed above:  1. I discussed my findings with the patient at extensive length.  He is on anticoagulation as we are trying to do a pharmacological cardioversion.  His blood pressure is borderline.  His EKG is unremarkable.  I have asked him to double his flecainide to 100 mg twice daily.  He is agreeable to do so.  Benefits and potential risks explained.  He will have a Chem-7.  I extensively reviewed again records from [facility] today.  Also his EKG done on 02/08/2019 reveals atrial fibrillation with a heart rate of 171 and a QRS of 78 ms and a QTC of 425 ms. 2. He will be seen in follow-up appointment on Thursday for a similar visit.  Total time for this evaluation was 25 minutes.  Benefits and potential risks of anticoagulation were also explained and he understood.   Medication Adjustments/Labs and Tests Ordered: Current medicines are reviewed at length with the patient today.  Concerns regarding medicines are outlined above.  Orders Placed This Encounter  Procedures  . Basic Metabolic Panel (BMET)   No orders of the defined types were placed in this encounter.    No chief complaint on file.    History of Present Illness:    Aaron [name] is a 58 y.o. male with history of recently diagnosed atrial fibrillation.  The patient has been initiated on flecainide by me and is tolerating it well.  His heart rate is better he is in atrial fibrillation and he tells me he feels much better.  No chest pain orthopnea or PND.  At the time of my evaluation, the patient is alert awake oriented and in no distress.  No past medical history on file.  Past Surgical History:  Procedure Laterality Date  .  BACK SURGERY    . CHOLECYSTECTOMY      Current Medications: No outpatient medications have been marked as taking for the 03/14/19 encounter (Office Visit) with Revankar, Reita Cliche, MD.     Allergies:   Codeine and Penicillins   Social History   Socioeconomic History  . Marital status: Married    Spouse name: Not on file  . Number of children: Not on file  . Years of education: Not on file  . Highest education level: Not on file  Occupational History  . Not on file  Social Needs  . Financial resource strain: Not on file  . Food insecurity    Worry: Not on file    Inability: Not on file  . Transportation needs    Medical: Not on file    Non-medical: Not on file  Tobacco Use  . Smoking status: Former Smoker    Packs/day: 1.50    Years: 40.00    Pack years: 60.00    Types: Cigarettes    Quit date: 09/22/2013    Years since quitting: 5.4  . Smokeless tobacco: Never Used  Substance and Sexual Activity  . Alcohol use: Not on file  . Drug use: Not on file  . Sexual activity: Not on file  Lifestyle  . Physical activity    Days per week: Not  on file    Minutes per session: Not on file  . Stress: Not on file  Relationships  . Social Herbalist on phone: Not on file    Gets together: Not on file    Attends religious service: Not on file    Active member of club or organization: Not on file    Attends meetings of clubs or organizations: Not on file    Relationship status: Not on file  Other Topics Concern  . Not on file  Social History Narrative  . Not on file     Family History: The patient's family history includes Lung cancer in his brother, father, mother, and sister.  ROS:   Please see the history of present illness.    All other systems reviewed and are negative.  EKGs/Labs/Other Studies Reviewed:    The following studies were reviewed today: EKG done today reveals atrial fibrillation with a heart rate of approximately 100 and nonspecific ST-T  changes.  No significant change in QRS complex.   Recent Labs: No results found for requested labs within last 8760 hours.  Recent Lipid Panel No results found for: CHOL, TRIG, HDL, CHOLHDL, VLDL, LDLCALC, LDLDIRECT  Physical Exam:    VS:  BP 110/62 (BP Location: Right Arm, Patient Position: Sitting, Cuff Size: Large)     Wt Readings from Last 3 Encounters:  03/09/19 175 lb (79.4 kg)  02/16/19 280 lb (127 kg)  01/25/19 276 lb (125.2 kg)     GEN: Patient is in no acute distress HEENT: Normal NECK: No JVD; No carotid bruits LYMPHATICS: No lymphadenopathy CARDIAC: Hear sounds regular, 2/6 systolic murmur at the apex. RESPIRATORY:  Clear to auscultation without rales, wheezing or rhonchi  ABDOMEN: Soft, non-tender, non-distended MUSCULOSKELETAL:  No edema; No deformity  SKIN: Warm and dry NEUROLOGIC:  Alert and oriented x 3 PSYCHIATRIC:  Normal affect   Signed, Jenean Lindau, MD  03/14/2019 9:20 AM    [place]

## 2019-03-16 ENCOUNTER — Telehealth: Payer: Self-pay

## 2019-03-16 NOTE — Telephone Encounter (Signed)
Results relayed to patient, no further questions at this time. Copy of results sent to Dr. Manson Allan per Dr. Docia Furl request.

## 2019-03-16 NOTE — Telephone Encounter (Signed)
-----   Message from Jenean Lindau, MD sent at 03/14/2019  4:39 PM EDT ----- The results of the study is unremarkable. Please inform patient. I will discuss in detail at next appointment. Cc  primary care/referring physician Jenean Lindau, MD 03/14/2019 4:38 PM

## 2019-03-17 ENCOUNTER — Other Ambulatory Visit: Payer: Self-pay

## 2019-03-17 ENCOUNTER — Encounter: Payer: Self-pay | Admitting: Cardiology

## 2019-03-17 ENCOUNTER — Ambulatory Visit: Payer: 59 | Admitting: Cardiology

## 2019-03-17 VITALS — BP 118/76 | HR 85 | Ht 72.0 in | Wt 277.0 lb

## 2019-03-17 DIAGNOSIS — E782 Mixed hyperlipidemia: Secondary | ICD-10-CM

## 2019-03-17 DIAGNOSIS — I480 Paroxysmal atrial fibrillation: Secondary | ICD-10-CM | POA: Diagnosis not present

## 2019-03-17 MED ORDER — METOPROLOL SUCCINATE ER 50 MG PO TB24: 50.0000 mg | ORAL_TABLET | Freq: Every day | ORAL | 4 refills | Status: DC

## 2019-03-17 NOTE — Patient Instructions (Signed)
Medication Instructions:  Your physician has recommended you make the following change in your medication:  STOP taking diltilazem   START taking Toprol XL 50 mg (1 tablet) once daily  If you need a refill on your cardiac medications before your next appointment, please call your pharmacy.   Lab work: NONE If you have labs (blood work) drawn today and your tests are completely normal, you will receive your results only by: Marland Kitchen MyChart Message (if you have MyChart) OR . A paper copy in the mail If you have any lab test that is abnormal or we need to change your treatment, we will call you to review the results.  Testing/Procedures: NONE  Follow-Up: At [facility], you and your health needs are our priority.  As part of our continuing mission to provide you with exceptional heart care, we have created designated Provider Care Teams.  These Care Teams include your primary Cardiologist (physician) and Advanced Practice Providers (APPs -  Physician Assistants and Nurse Practitioners) who all work together to provide you with the care you need, when you need it. You will need a follow up appointment in 5 days.    Any Other Special Instructions Will Be Listed Below   Metoprolol extended-release tablets What is this medicine? METOPROLOL (me TOE proe lole) is a beta-blocker. Beta-blockers reduce the workload on the heart and help it to beat more regularly. This medicine is used to treat high blood pressure and to prevent chest pain. It is also used to after a heart attack and to prevent an additional heart attack from occurring. This medicine may be used for other purposes; ask your health care provider or pharmacist if you have questions. COMMON BRAND NAME(S): toprol, Toprol XL What should I tell my health care provider before I take this medicine? They need to know if you have any of these conditions: -diabetes -heart or vessel disease like slow heart rate, worsening heart failure, heart  block, sick sinus syndrome or Raynaud's disease -kidney disease -liver disease -lung or breathing disease, like asthma or emphysema -pheochromocytoma -thyroid disease -an unusual or allergic reaction to metoprolol, other beta-blockers, medicines, foods, dyes, or preservatives -pregnant or trying to get pregnant -breast-feeding How should I use this medicine? Take this medicine by mouth with a glass of water. Follow the directions on the prescription label. Do not crush or chew. Take this medicine with or immediately after meals. Take your doses at regular intervals. Do not take more medicine than directed. Do not stop taking this medicine suddenly. This could lead to serious heart-related effects. Talk to your pediatrician regarding the use of this medicine in children. While this drug may be prescribed for children as young as 6 years for selected conditions, precautions do apply. Overdosage: If you think you have taken too much of this medicine contact a poison control center or emergency room at once. NOTE: This medicine is only for you. Do not share this medicine with others. What if I miss a dose? If you miss a dose, take it as soon as you can. If it is almost time for your next dose, take only that dose. Do not take double or extra doses. What may interact with this medicine? This medicine may interact with the following medications: -certain medicines for blood pressure, heart disease, irregular heart beat -certain medicines for depression, like monoamine oxidase (MAO) inhibitors, fluoxetine, or paroxetine -clonidine -dobutamine -epinephrine -isoproterenol -reserpine This list may not describe all possible interactions. Give your health care provider  a list of all the medicines, herbs, non-prescription drugs, or dietary supplements you use. Also tell them if you smoke, drink alcohol, or use illegal drugs. Some items may interact with your medicine. What should I watch for while using  this medicine? Visit your doctor or health care professional for regular check ups. Contact your doctor right away if your symptoms worsen. Check your blood pressure and pulse rate regularly. Ask your health care professional what your blood pressure and pulse rate should be, and when you should contact them. You may get drowsy or dizzy. Do not drive, use machinery, or do anything that needs mental alertness until you know how this medicine affects you. Do not sit or stand up quickly, especially if you are an older patient. This reduces the risk of dizzy or fainting spells. Contact your doctor if these symptoms continue. Alcohol may interfere with the effect of this medicine. Avoid alcoholic drinks. What side effects may I notice from receiving this medicine? Side effects that you should report to your doctor or health care professional as soon as possible: -allergic reactions like skin rash, itching or hives -cold or numb hands or feet -depression -difficulty breathing -faint -fever with sore throat -irregular heartbeat, chest pain -rapid weight gain -swollen legs or ankles Side effects that usually do not require medical attention (report to your doctor or health care professional if they continue or are bothersome): -anxiety or nervousness -change in sex drive or performance -dry skin -headache -nightmares or trouble sleeping -short term memory loss -stomach upset or diarrhea -unusually tired This list may not describe all possible side effects. Call your doctor for medical advice about side effects. You may report side effects to FDA at 1-800-FDA-1088. Where should I keep my medicine? Keep out of the reach of children. Store at room temperature between 15 and 30 degrees C (59 and 86 degrees F). Throw away any unused medicine after the expiration date. NOTE: This sheet is a summary. It may not cover all possible information. If you have questions about this medicine, talk to your  doctor, pharmacist, or health care provider.  2019 Elsevier/Gold Standard (2013-05-13 14:41:37)

## 2019-03-17 NOTE — Addendum Note (Signed)
Addended by: Tarri Glenn on: 03/17/2019 10:15 AM   Modules accepted: Orders

## 2019-03-17 NOTE — Progress Notes (Signed)
Cardiology Office Note:    Date:  03/17/2019   ID:  Aaron [name], DOB [birth year], MRN [id number]  PCP:  Lowella Dandy, NP  Cardiologist:  Jenean Lindau, MD   Referring MD: Lowella Dandy, NP    ASSESSMENT:    1. Paroxysmal atrial fibrillation (HCC)   2. Mixed dyslipidemia    PLAN:    In order of problems listed above:  1. Paroxysmal atrial fibrillation:I discussed with the patient atrial fibrillation, disease process. Management and therapy including rate and rhythm control, anticoagulation benefits and potential risks were discussed extensively with the patient. Patient had multiple questions which were answered to patient's satisfaction.  In view of elevated heart rate I have stopped his Cardizem.  I will initiate the patient on metoprolol succinate 50 mg daily.  He will take his first dose now as he has not taken his Cardizem this morning.  He will continue his anticoagulation.  He will be back on Tuesday for a follow-up visit to evaluate vital signs.  At that time I could consider increasing his flecainide depending on his response to the a forementioned measures.  He will also undergo stress testing in the near future.   Medication Adjustments/Labs and Tests Ordered: Current medicines are reviewed at length with the patient today.  Concerns regarding medicines are outlined above.  No orders of the defined types were placed in this encounter.  Meds ordered this encounter  Medications  . metoprolol succinate (TOPROL XL) 50 MG 24 hr tablet    Sig: Take 1 tablet (50 mg total) by mouth daily. Take with or immediately following a meal.    Dispense:  30 tablet    Refill:  4     No chief complaint on file.    History of Present Illness:    Aaron [name] is a 58 y.o. male.  Patient has history of approximately fibrillation and mixed dyslipidemia.  He is here for follow-up.  He was initiated on flecainide 50 mg twice daily.  He denies any problems except some shortness of breath on  exertion and fatigue.  At the time of my evaluation, the patient is alert awake oriented and in no distress.  History reviewed. No pertinent past medical history.  Past Surgical History:  Procedure Laterality Date  . BACK SURGERY    . CHOLECYSTECTOMY      Current Medications: Current Meds  Medication Sig  . atorvastatin (LIPITOR) 10 MG tablet Take 10 mg by mouth daily.  Marland Kitchen ELIQUIS 5 MG TABS tablet Take 5 mg by mouth 2 (two) times daily.  Marland Kitchen escitalopram (LEXAPRO) 10 MG tablet Take 1 tablet by mouth daily.  . flecainide (TAMBOCOR) 50 MG tablet Take 2 tablets (100 mg total) by mouth 2 (two) times daily.  . temazepam (RESTORIL) 15 MG capsule Take 1 capsule by mouth daily.  . [DISCONTINUED] diltiazem (CARDIZEM) 60 MG tablet Take 60 mg by mouth every 12 (twelve) hours.     Allergies:   Codeine and Penicillins   Social History   Socioeconomic History  . Marital status: Married    Spouse name: Not on file  . Number of children: Not on file  . Years of education: Not on file  . Highest education level: Not on file  Occupational History  . Not on file  Social Needs  . Financial resource strain: Not on file  . Food insecurity    Worry: Not on file    Inability: Not on file  . Transportation needs  Medical: Not on file    Non-medical: Not on file  Tobacco Use  . Smoking status: Former Smoker    Packs/day: 1.50    Years: 40.00    Pack years: 60.00    Types: Cigarettes    Quit date: 09/22/2013    Years since quitting: 5.4  . Smokeless tobacco: Never Used  Substance and Sexual Activity  . Alcohol use: Not on file  . Drug use: Not on file  . Sexual activity: Not on file  Lifestyle  . Physical activity    Days per week: Not on file    Minutes per session: Not on file  . Stress: Not on file  Relationships  . Social Herbalist on phone: Not on file    Gets together: Not on file    Attends religious service: Not on file    Active member of club or organization:  Not on file    Attends meetings of clubs or organizations: Not on file    Relationship status: Not on file  Other Topics Concern  . Not on file  Social History Narrative  . Not on file     Family History: The patient's family history includes Lung cancer in his brother, father, mother, and sister.  ROS:   Please see the history of present illness.    All other systems reviewed and are negative.  EKGs/Labs/Other Studies Reviewed:    The following studies were reviewed today: EKG reveals atrial fibrillation with increased ventricular rate at rest.  Heart rate was 82.   Recent Labs: 03/14/2019: BUN 17; Creatinine, Ser 0.93; Potassium 4.1; Sodium 139  Recent Lipid Panel No results found for: CHOL, TRIG, HDL, CHOLHDL, VLDL, LDLCALC, LDLDIRECT  Physical Exam:    VS:  BP 118/76 (BP Location: Left Arm, Patient Position: Sitting, Cuff Size: Normal)   Pulse 85   Ht 6' (1.829 m)   Wt 277 lb (125.6 kg)   SpO2 98%   BMI 37.57 kg/m     Wt Readings from Last 3 Encounters:  03/17/19 277 lb (125.6 kg)  03/14/19 175 lb (79.4 kg)  03/09/19 175 lb (79.4 kg)     GEN: Patient is in no acute distress HEENT: Normal NECK: No JVD; No carotid bruits LYMPHATICS: No lymphadenopathy CARDIAC: Hear sounds regular, 2/6 systolic murmur at the apex. RESPIRATORY:  Clear to auscultation without rales, wheezing or rhonchi  ABDOMEN: Soft, non-tender, non-distended MUSCULOSKELETAL:  No edema; No deformity  SKIN: Warm and dry NEUROLOGIC:  Alert and oriented x 3 PSYCHIATRIC:  Normal affect   Signed, Jenean Lindau, MD  03/17/2019 9:14 AM    [place]

## 2019-03-22 ENCOUNTER — Ambulatory Visit: Payer: 59 | Admitting: Cardiology

## 2019-03-23 ENCOUNTER — Telehealth: Payer: Self-pay | Admitting: *Deleted

## 2019-03-23 ENCOUNTER — Ambulatory Visit: Payer: 59 | Admitting: Cardiology

## 2019-03-23 NOTE — Telephone Encounter (Signed)
Faxed stool culture results to Dr. Marisa Hua office.

## 2019-03-24 ENCOUNTER — Ambulatory Visit (INDEPENDENT_AMBULATORY_CARE_PROVIDER_SITE_OTHER): Payer: 59 | Admitting: Cardiology

## 2019-03-24 ENCOUNTER — Other Ambulatory Visit: Payer: Self-pay

## 2019-03-24 DIAGNOSIS — I480 Paroxysmal atrial fibrillation: Secondary | ICD-10-CM | POA: Diagnosis not present

## 2019-03-24 NOTE — Patient Instructions (Addendum)
Medication Instructions:  Your physician has recommended you make the following change in your medication:  INCREASE Flecainide to 100 mg (2 tablets) twice daily   If you need a refill on your cardiac medications before your next appointment, please call your pharmacy.   Lab work: NONE If you have labs (blood work) drawn today and your tests are completely normal, you will receive your results only by: Marland Kitchen MyChart Message (if you have MyChart) OR . A paper copy in the mail If you have any lab test that is abnormal or we need to change your treatment, we will call you to review the results.  Testing/Procedures: Your physician has requested that you have a lexiscan myoview. For further information please visit [email or web address]. Please follow instruction sheet, as given.    Follow-Up: At [facility], you and your health needs are our priority.  As part of our continuing mission to provide you with exceptional heart care, we have created designated Provider Care Teams.  These Care Teams include your primary Cardiologist (physician) and Advanced Practice Providers (APPs -  Physician Assistants and Nurse Practitioners) who all work together to provide you with the care you need, when you need it. You will need a follow up appointment in 5 days.    Any Other Special Instructions Will Be Listed Below   Cardiac Nuclear Scan A cardiac nuclear scan is a test that is done to check the flow of blood to your heart. It is done when you are resting and when you are exercising. The test looks for problems such as:  Not enough blood reaching a portion of the heart.  The heart muscle not working as it should. You may need this test if:  You have heart disease.  You have had lab results that are not normal.  You have had heart surgery or a balloon procedure to open up blocked arteries (angioplasty).  You have chest pain.  You have shortness of breath. In this test, a special dye (tracer)  is put into your bloodstream. The tracer will travel to your heart. A camera will then take pictures of your heart to see how the tracer moves through your heart. This test is usually done at a hospital and takes 2-4 hours. Tell a doctor about:  Any allergies you have.  All medicines you are taking, including vitamins, herbs, eye drops, creams, and over-the-counter medicines.  Any problems you or family members have had with anesthetic medicines.  Any blood disorders you have.  Any surgeries you have had.  Any medical conditions you have.  Whether you are pregnant or may be pregnant. What are the risks? Generally, this is a safe test. However, problems may occur, such as:  Serious chest pain and heart attack. This is only a risk if the stress portion of the test is done.  Rapid heartbeat.  A feeling of warmth in your chest. This feeling usually does not last long.  Allergic reaction to the tracer. What happens before the test?  Ask your doctor about changing or stopping your normal medicines. This is important.  Follow instructions from your doctor about what you cannot eat or drink.  Remove your jewelry on the day of the test. What happens during the test?  An IV tube will be inserted into one of your veins.  Your doctor will give you a small amount of tracer through the IV tube.  You will wait for 20-40 minutes while the tracer moves through your bloodstream.  Your heart will be monitored with an electrocardiogram (ECG).  You will lie down on an exam table.  Pictures of your heart will be taken for about 15-20 minutes.  You may also have a stress test. For this test, one of these things may be done: ? You will be asked to exercise on a treadmill or a stationary bike. ? You will be given medicines that will make your heart work harder. This is done if you are unable to exercise.  When blood flow to your heart has peaked, a tracer will again be given through the IV  tube.  After 20-40 minutes, you will get back on the exam table. More pictures will be taken of your heart.  Depending on the tracer that is used, more pictures may need to be taken 3-4 hours later.  Your IV tube will be removed when the test is over. The test may vary among doctors and hospitals. What happens after the test?  Ask your doctor: ? Whether you can return to your normal schedule, including diet, activities, and medicines. ? Whether you should drink more fluids. This will help to remove the tracer from your body. Drink enough fluid to keep your pee (urine) pale yellow.  Ask your doctor, or the department that is doing the test: ? When will my results be ready? ? How will I get my results? Summary  A cardiac nuclear scan is a test that is done to check the flow of blood to your heart.  Tell your doctor whether you are pregnant or may be pregnant.  Before the test, ask your doctor about changing or stopping your normal medicines. This is important.  Ask your doctor whether you can return to your normal activities. You may be asked to drink more fluids. This information is not intended to replace advice given to you by your health care provider. Make sure you discuss any questions you have with your health care provider. Document Released: 02/22/2018 Document Revised: 12/29/2018 Document Reviewed: 02/22/2018 Elsevier Patient Education  [place] injection What is this medicine? REGADENOSON is used to test the heart for coronary artery disease. It is used in patients who can not exercise for their stress test. This medicine may be used for other purposes; ask your health care provider or pharmacist if you have questions. COMMON BRAND NAME(S): Lexiscan What should I tell my health care provider before I take this medicine? They need to know if you have any of these conditions:  heart problems  lung or breathing disease, like asthma or COPD  an  unusual or allergic reaction to regadenoson, other medicines, foods, dyes, or preservatives  pregnant or trying to get pregnant  breast-feeding How should I use this medicine? This medicine is for injection into a vein. It is given by a health care professional in a hospital or clinic setting. Talk to your pediatrician regarding the use of this medicine in children. Special care may be needed. Overdosage: If you think you have taken too much of this medicine contact a poison control center or emergency room at once. NOTE: This medicine is only for you. Do not share this medicine with others. What if I miss a dose? This does not apply. What may interact with this medicine?  caffeine  dipyridamole  guarana  theophylline This list may not describe all possible interactions. Give your health care provider a list of all the medicines, herbs, non-prescription drugs, or dietary supplements you use. Also tell  them if you smoke, drink alcohol, or use illegal drugs. Some items may interact with your medicine. What should I watch for while using this medicine? Your condition will be monitored carefully while you are receiving this medicine. Do not take medicines, foods, or drinks with caffeine (like coffee, tea, or colas) for at least 12 hours before your test. If you do not know if something contains caffeine, ask your health care professional. What side effects may I notice from receiving this medicine? Side effects that you should report to your doctor or health care professional as soon as possible:  allergic reactions like skin rash, itching or hives, swelling of the face, lips, or tongue  breathing problems  chest pain, tightness or palpitations  severe headache Side effects that usually do not require medical attention (report to your doctor or health care professional if they continue or are bothersome):  flushing  headache  irritation or pain at site where injected  nausea,  vomiting This list may not describe all possible side effects. Call your doctor for medical advice about side effects. You may report side effects to FDA at 1-800-FDA-1088. Where should I keep my medicine? This drug is given in a hospital or clinic and will not be stored at home. NOTE: This sheet is a summary. It may not cover all possible information. If you have questions about this medicine, talk to your doctor, pharmacist, or health care provider.  2020 Elsevier/Gold Standard (2008-05-08 15:08:13)

## 2019-03-24 NOTE — Addendum Note (Signed)
Addended by: Jerl Santos R on: 03/24/2019 11:19 AM   Modules accepted: Orders

## 2019-03-24 NOTE — Addendum Note (Signed)
Addended by: Beckey Rutter on: 03/24/2019 08:55 AM   Modules accepted: Orders

## 2019-03-24 NOTE — Progress Notes (Signed)
Patient came in for a EKG.  He is on flecainide therapy.  EKG revealed him to be in atrial fibrillation with heart rate of 93.  Patient is alert awake oriented and feels much better.  Questions were answered to his satisfaction.  In view of this I also reviewed his recent Chem-7.  I have increased his flecainide 200 mg twice daily and he will be seen in appointment on Tuesday.  He will also be scheduled for a Lexiscan sestamibi to assess for objective evidence of coronary artery disease.  At this point he is walking on a regular basis and has no chest pain orthopnea or PND.

## 2019-03-28 ENCOUNTER — Telehealth: Payer: Self-pay

## 2019-03-28 MED ORDER — FLECAINIDE ACETATE 100 MG PO TABS: 100.0000 mg | ORAL_TABLET | Freq: Two times a day (BID) | ORAL | 3 refills | Status: DC

## 2019-03-28 NOTE — Telephone Encounter (Signed)
Patient was instructed on 03/24/19 to start taking flecainide 100 mg BID but he has been taking that dose since 03/14/19.Per Dr.RRR note this was discussed. Information sent to Dr. Geraldo Pitter to verify if this dosage is correct or needs to be adjusted?

## 2019-03-29 ENCOUNTER — Other Ambulatory Visit: Payer: Self-pay

## 2019-03-29 ENCOUNTER — Encounter: Payer: Self-pay | Admitting: Cardiology

## 2019-03-29 ENCOUNTER — Ambulatory Visit (INDEPENDENT_AMBULATORY_CARE_PROVIDER_SITE_OTHER): Payer: 59 | Admitting: Cardiology

## 2019-03-29 VITALS — BP 134/72 | HR 99 | Ht 73.0 in | Wt 275.0 lb

## 2019-03-29 DIAGNOSIS — E782 Mixed hyperlipidemia: Secondary | ICD-10-CM | POA: Diagnosis not present

## 2019-03-29 DIAGNOSIS — I4819 Other persistent atrial fibrillation: Secondary | ICD-10-CM

## 2019-03-29 MED ORDER — METOPROLOL SUCCINATE ER 50 MG PO TB24: 75.0000 mg | ORAL_TABLET | Freq: Every day | ORAL | 4 refills | Status: DC

## 2019-03-29 MED ORDER — METOPROLOL SUCCINATE ER 50 MG PO TB24: ORAL_TABLET | ORAL | 4 refills | Status: DC

## 2019-03-29 NOTE — Addendum Note (Signed)
Addended by: Beckey Rutter on: 03/29/2019 10:33 AM   Modules accepted: Orders

## 2019-03-29 NOTE — Patient Instructions (Addendum)
Medication Instructions:  Your physician has recommended you make the following change in your medication:  INCREASE Toprol to 75 mg daily. TAKE 25 mg (0.5 tablets in morning), Take 50 mg (1 tablet at night)  If you need a refill on your cardiac medications before your next appointment, please call your pharmacy.   Lab work: NONE If you have labs (blood work) drawn today and your tests are completely normal, you will receive your results only by: Marland Kitchen MyChart Message (if you have MyChart) OR . A paper copy in the mail If you have any lab test that is abnormal or we need to change your treatment, we will call you to review the results.  Testing/Procedures: You had an EKG performed today  Follow-Up: At [facility], you and your health needs are our priority.  As part of our continuing mission to provide you with exceptional heart care, we have created designated Provider Care Teams.  These Care Teams include your primary Cardiologist (physician) and Advanced Practice Providers (APPs -  Physician Assistants and Nurse Practitioners) who all work together to provide you with the care you need, when you need it. You will need a follow up appointment in 1 months.

## 2019-03-29 NOTE — Addendum Note (Signed)
Addended by: Beckey Rutter on: 03/29/2019 09:10 AM   Modules accepted: Orders

## 2019-03-29 NOTE — Progress Notes (Signed)
Cardiology Office Note:    Date:  03/29/2019   ID:  Aaron [name], DOB 1961-08-08, MRN [id number]  PCP:  Lowella Dandy, NP  Cardiologist:  Jenean Lindau, MD   Referring MD: Lowella Dandy, NP    ASSESSMENT:    1. Persistent atrial fibrillation   2. Mixed dyslipidemia    PLAN:    In order of problems listed above:  1. Atrial fibrillation: I discussed my findings with the patient at extensive length.  His heart rate is elevated and therefore I will initiate him on an additional 25 mg of Toprol-XL.  He is feeling much better.  Though when he exerts himself significantly shortness of breath.  We will do a Lexiscan sestamibi.  Once this is done I will see him in appointment.  If he still needs atrial fibrillation I will schedule him for elective cardioversion.  This was explained to him at length and he vocalized understanding 2. Essential hypertension: Blood pressure stable   Medication Adjustments/Labs and Tests Ordered: Current medicines are reviewed at length with the patient today.  Concerns regarding medicines are outlined above.  No orders of the defined types were placed in this encounter.  No orders of the defined types were placed in this encounter.    No chief complaint on file.    History of Present Illness:    Aaron [name] is a 58 y.o. male with past medical history of atrial fibrillation.  He has history of hypertension.  He denies any problems at this time and takes care of activities of daily living.  He is being considered for cardioversion.  He has a stress test coming in the next few days.  He is tells me that he felt much better and feels much better than before.  At the time of my evaluation, the patient is alert awake oriented and in no distress.  No past medical history on file.  Past Surgical History:  Procedure Laterality Date  . BACK SURGERY    . CHOLECYSTECTOMY      Current Medications: Current Meds  Medication Sig  . atorvastatin (LIPITOR) 10 MG  tablet Take 10 mg by mouth daily.  Marland Kitchen ELIQUIS 5 MG TABS tablet Take 5 mg by mouth 2 (two) times daily.  Marland Kitchen escitalopram (LEXAPRO) 10 MG tablet Take 1 tablet by mouth daily.  . flecainide (TAMBOCOR) 100 MG tablet Take 1 tablet (100 mg total) by mouth 2 (two) times daily.  . metoprolol succinate (TOPROL XL) 50 MG 24 hr tablet Take 1 tablet (50 mg total) by mouth daily. Take with or immediately following a meal.  . temazepam (RESTORIL) 15 MG capsule Take 1 capsule by mouth daily.     Allergies:   Codeine and Penicillins   Social History   Socioeconomic History  . Marital status: Married    Spouse name: Not on file  . Number of children: Not on file  . Years of education: Not on file  . Highest education level: Not on file  Occupational History  . Not on file  Social Needs  . Financial resource strain: Not on file  . Food insecurity    Worry: Not on file    Inability: Not on file  . Transportation needs    Medical: Not on file    Non-medical: Not on file  Tobacco Use  . Smoking status: Former Smoker    Packs/day: 1.50    Years: 40.00    Pack years: 60.00    Types:  Cigarettes    Quit date: 09/22/2013    Years since quitting: 5.5  . Smokeless tobacco: Never Used  Substance and Sexual Activity  . Alcohol use: Not on file  . Drug use: Not on file  . Sexual activity: Not on file  Lifestyle  . Physical activity    Days per week: Not on file    Minutes per session: Not on file  . Stress: Not on file  Relationships  . Social Herbalist on phone: Not on file    Gets together: Not on file    Attends religious service: Not on file    Active member of club or organization: Not on file    Attends meetings of clubs or organizations: Not on file    Relationship status: Not on file  Other Topics Concern  . Not on file  Social History Narrative  . Not on file     Family History: The patient's family history includes Lung cancer in his brother, father, mother, and sister.   ROS:   Please see the history of present illness.    All other systems reviewed and are negative.  EKGs/Labs/Other Studies Reviewed:    The following studies were reviewed today: As mentioned above.  EKG done today reveals atrial fibrillation with a heart rate of 99.   Recent Labs: 03/14/2019: BUN 17; Creatinine, Ser 0.93; Potassium 4.1; Sodium 139  Recent Lipid Panel No results found for: CHOL, TRIG, HDL, CHOLHDL, VLDL, LDLCALC, LDLDIRECT  Physical Exam:    VS:  BP 134/72 (BP Location: Left Arm, Patient Position: Sitting, Cuff Size: Normal)   Pulse 99   Ht 6\' 1"  (1.854 m)   Wt 275 lb (124.7 kg)   SpO2 96%   BMI 36.28 kg/m     Wt Readings from Last 3 Encounters:  03/29/19 275 lb (124.7 kg)  03/17/19 277 lb (125.6 kg)  03/14/19 175 lb (79.4 kg)     GEN: Patient is in no acute distress HEENT: Normal NECK: No JVD; No carotid bruits LYMPHATICS: No lymphadenopathy CARDIAC: Hear sounds regular, 2/6 systolic murmur at the apex. RESPIRATORY:  Clear to auscultation without rales, wheezing or rhonchi  ABDOMEN: Soft, non-tender, non-distended MUSCULOSKELETAL:  No edema; No deformity  SKIN: Warm and dry NEUROLOGIC:  Alert and oriented x 3 PSYCHIATRIC:  Normal affect   Signed, Jenean Lindau, MD  03/29/2019 8:35 AM    [place]

## 2019-04-04 ENCOUNTER — Other Ambulatory Visit: Payer: Self-pay | Admitting: Cardiology

## 2019-05-05 ENCOUNTER — Telehealth (HOSPITAL_COMMUNITY): Payer: Self-pay | Admitting: *Deleted

## 2019-05-05 NOTE — Telephone Encounter (Signed)
Patient given detailed instructions per Myocardial Perfusion Study Information Sheet for the test on 05/12/19. Patient notified to arrive 15 minutes early and that it is imperative to arrive on time for appointment to keep from having the test rescheduled.  If you need to cancel or reschedule your appointment, please call the office within 24 hours of your appointment. . Patient verbalized understanding. Kirstie Peri

## 2019-05-12 ENCOUNTER — Other Ambulatory Visit: Payer: Self-pay

## 2019-05-12 ENCOUNTER — Ambulatory Visit (INDEPENDENT_AMBULATORY_CARE_PROVIDER_SITE_OTHER): Payer: 59

## 2019-05-12 DIAGNOSIS — I480 Paroxysmal atrial fibrillation: Secondary | ICD-10-CM

## 2019-05-12 LAB — MYOCARDIAL PERFUSION IMAGING
LV dias vol: 108 mL (ref 62–150)
LV sys vol: 52 mL
Peak HR: 129 {beats}/min
Rest HR: 92 {beats}/min
SDS: 2
SRS: 0
SSS: 2
TID: 1.28

## 2019-05-12 MED ORDER — TECHNETIUM TC 99M TETROFOSMIN IV KIT
32.1000 | PACK | Freq: Once | INTRAVENOUS | Status: AC | PRN
  Administered 2019-05-12: 32.1 via INTRAVENOUS

## 2019-05-12 MED ORDER — REGADENOSON 0.4 MG/5ML IV SOLN
0.4000 mg | Freq: Once | INTRAVENOUS | Status: AC
  Administered 2019-05-12: 0.4 mg via INTRAVENOUS

## 2019-05-12 MED ORDER — TECHNETIUM TC 99M TETROFOSMIN IV KIT
9.5000 | PACK | Freq: Once | INTRAVENOUS | Status: AC | PRN
  Administered 2019-05-12: 9.5 via INTRAVENOUS

## 2019-05-13 ENCOUNTER — Other Ambulatory Visit: Payer: Self-pay | Admitting: Internal Medicine

## 2019-05-13 ENCOUNTER — Encounter: Payer: Self-pay | Admitting: *Deleted

## 2019-05-20 ENCOUNTER — Ambulatory Visit: Payer: 59 | Admitting: Cardiology

## 2019-05-27 ENCOUNTER — Other Ambulatory Visit (HOSPITAL_COMMUNITY): Payer: 59

## 2019-05-27 ENCOUNTER — Other Ambulatory Visit: Payer: Self-pay | Admitting: *Deleted

## 2019-05-27 ENCOUNTER — Encounter: Payer: Self-pay | Admitting: Cardiology

## 2019-05-27 ENCOUNTER — Other Ambulatory Visit: Payer: Self-pay

## 2019-05-27 ENCOUNTER — Ambulatory Visit (INDEPENDENT_AMBULATORY_CARE_PROVIDER_SITE_OTHER): Payer: 59 | Admitting: Cardiology

## 2019-05-27 VITALS — BP 98/70 | HR 116 | Ht 73.0 in | Wt 281.0 lb

## 2019-05-27 DIAGNOSIS — E782 Mixed hyperlipidemia: Secondary | ICD-10-CM

## 2019-05-27 DIAGNOSIS — J449 Chronic obstructive pulmonary disease, unspecified: Secondary | ICD-10-CM

## 2019-05-27 DIAGNOSIS — I4819 Other persistent atrial fibrillation: Secondary | ICD-10-CM

## 2019-05-27 MED ORDER — FLECAINIDE ACETATE 150 MG PO TABS: 150.0000 mg | ORAL_TABLET | Freq: Two times a day (BID) | ORAL | 3 refills | Status: DC

## 2019-05-27 NOTE — Patient Instructions (Signed)
Medication Instructions:  Your physician has recommended you make the following change in your medication:  INCREASE flecainide to 150 mg ( 1 tablet) twice daily  If you need a refill on your cardiac medications before your next appointment, please call your pharmacy.   Lab work: NONE If you have labs (blood work) drawn today and your tests are completely normal, you will receive your results only by: Marland Kitchen MyChart Message (if you have MyChart) OR . A paper copy in the mail If you have any lab test that is abnormal or we need to change your treatment, we will call you to review the results.  Testing/Procedures: You had an EKG performed today  Follow-Up: At [facility], you and your health needs are our priority.  As part of our continuing mission to provide you with exceptional heart care, we have created designated Provider Care Teams.  These Care Teams include your primary Cardiologist (physician) and Advanced Practice Providers (APPs -  Physician Assistants and Nurse Practitioners) who all work together to provide you with the care you need, when you need it. You will need a follow up appointment in 1 weeks.

## 2019-05-27 NOTE — Progress Notes (Signed)
Cardiology Office Note:    Date:  05/27/2019   ID:  Aaron [name], DOB [birth year], MRN [id number]  PCP:  Lowella Dandy, NP  Cardiologist:  Jenean Lindau, MD   Referring MD: Lowella Dandy, NP    ASSESSMENT:    1. Persistent atrial fibrillation   2. Mixed dyslipidemia    PLAN:    In order of problems listed above:  1. Persistent atrial fibrillation: I discussed my findings with the patient at extensive length.I discussed with the patient atrial fibrillation, disease process. Management and therapy including rate and rhythm control, anticoagulation benefits and potential risks were discussed extensively with the patient. Patient had multiple questions which were answered to patient's satisfaction. 2. His heart rate is elevated and therefore I will increase his flecainide to 150 mg twice daily.  He will be back on Tuesday for EKG.  I will see him in 2 and half weeks and at that time schedule him for cardioversion.  I told him that it is not a guarantee that he will convert to normal rhythm because of his enlarged left atrium and he vocalized understanding. 3. Blood pressure is stable at this time.  He knows to go to the nearest emergency room for any concerning symptoms.   Medication Adjustments/Labs and Tests Ordered: Current medicines are reviewed at length with the patient today.  Concerns regarding medicines are outlined above.  No orders of the defined types were placed in this encounter.  No orders of the defined types were placed in this encounter.    Chief Complaint  Patient presents with   Follow-up     History of Present Illness:    Aaron [name] is a 58 y.o. male.  Patient has been diagnosed to have atrial fibrillation and is on anticoagulation.  The patient is currently receiving flecainide 100 mg twice daily and continues to be in atrial fibrillation.  He underwent colonoscopy 2 days ago for which his anticoagulation was held.  His colonoscopy was negative according to the  patient.  He denies any chest pain orthopnea or PND.  She has some easy fatigability.  At the time of my evaluation, the patient is alert awake oriented and in no distress.  No past medical history on file.  Past Surgical History:  Procedure Laterality Date   BACK SURGERY     CHOLECYSTECTOMY      Current Medications: Current Meds  Medication Sig   atorvastatin (LIPITOR) 10 MG tablet Take 10 mg by mouth daily.   ELIQUIS 5 MG TABS tablet Take 5 mg by mouth 2 (two) times daily.   escitalopram (LEXAPRO) 10 MG tablet Take 1 tablet by mouth daily.   flecainide (TAMBOCOR) 100 MG tablet Take 1 tablet (100 mg total) by mouth 2 (two) times daily.   metoprolol succinate (TOPROL XL) 50 MG 24 hr tablet Take 2 tablets (100 mg total) by mouth daily. Take 25mg  in morning and 50 mg at night with or immediately following a meal.   temazepam (RESTORIL) 15 MG capsule Take 1 capsule by mouth daily.     Allergies:   Codeine and Penicillins   Social History   Socioeconomic History   Marital status: Married    Spouse name: Not on file   Number of children: Not on file   Years of education: Not on file   Highest education level: Not on file  Occupational History   Not on file  Social Needs   Financial resource strain: Not on file  Food insecurity    Worry: Not on file    Inability: Not on file   Transportation needs    Medical: Not on file    Non-medical: Not on file  Tobacco Use   Smoking status: Former Smoker    Packs/day: 1.50    Years: 40.00    Pack years: 60.00    Types: Cigarettes    Quit date: 09/22/2013    Years since quitting: 5.6   Smokeless tobacco: Never Used  Substance and Sexual Activity   Alcohol use: Not on file   Drug use: Not on file   Sexual activity: Not on file  Lifestyle   Physical activity    Days per week: Not on file    Minutes per session: Not on file   Stress: Not on file  Relationships   Social connections    Talks on phone: Not  on file    Gets together: Not on file    Attends religious service: Not on file    Active member of club or organization: Not on file    Attends meetings of clubs or organizations: Not on file    Relationship status: Not on file  Other Topics Concern   Not on file  Social History Narrative   Not on file     Family History: The patient's family history includes Lung cancer in his brother, father, mother, and sister.  ROS:   Please see the history of present illness.    All other systems reviewed and are negative.  EKGs/Labs/Other Studies Reviewed:    The following studies were reviewed today: EKG reveals atrial fibrillation with a heart rate of 116.  Nonspecific ST-T changes.   Recent Labs: 03/14/2019: BUN 17; Creatinine, Ser 0.93; Potassium 4.1; Sodium 139  Recent Lipid Panel No results found for: CHOL, TRIG, HDL, CHOLHDL, VLDL, LDLCALC, LDLDIRECT  Physical Exam:    VS:  BP 98/70 (BP Location: Left Arm, Patient Position: Sitting, Cuff Size: Normal)    Pulse (!) 116    Ht 6\' 1"  (1.854 m)    Wt 281 lb (127.5 kg)    SpO2 96%    BMI 37.07 kg/m     Wt Readings from Last 3 Encounters:  05/27/19 281 lb (127.5 kg)  05/12/19 275 lb (124.7 kg)  03/29/19 275 lb (124.7 kg)     GEN: Patient is in no acute distress HEENT: Normal NECK: No JVD; No carotid bruits LYMPHATICS: No lymphadenopathy CARDIAC: Hear sounds regular, 2/6 systolic murmur at the apex. RESPIRATORY:  Clear to auscultation without rales, wheezing or rhonchi  ABDOMEN: Soft, non-tender, non-distended MUSCULOSKELETAL:  No edema; No deformity  SKIN: Warm and dry NEUROLOGIC:  Alert and oriented x 3 PSYCHIATRIC:  Normal affect   Signed, Jenean Lindau, MD  05/27/2019 2:27 PM    [facility]

## 2019-05-27 NOTE — Addendum Note (Signed)
Addended by: Beckey Rutter on: 05/27/2019 02:46 PM   Modules accepted: Orders

## 2019-05-28 ENCOUNTER — Other Ambulatory Visit (HOSPITAL_COMMUNITY)
Admission: RE | Admit: 2019-05-28 | Discharge: 2019-05-28 | Disposition: A | Discharge status: Home / Self Care | Payer: 59 | Source: Ambulatory Visit | Attending: Internal Medicine | Admitting: Internal Medicine

## 2019-05-28 DIAGNOSIS — Z01812 Encounter for preprocedural laboratory examination: Secondary | ICD-10-CM | POA: Insufficient documentation

## 2019-05-28 DIAGNOSIS — Z20828 Contact with and (suspected) exposure to other viral communicable diseases: Secondary | ICD-10-CM | POA: Diagnosis not present

## 2019-05-29 LAB — NOVEL CORONAVIRUS, NAA (HOSP ORDER, SEND-OUT TO REF LAB; TAT 18-24 HRS): SARS-CoV-2, NAA: NOT DETECTED

## 2019-05-31 ENCOUNTER — Ambulatory Visit: Payer: 59 | Admitting: Internal Medicine

## 2019-05-31 ENCOUNTER — Telehealth: Payer: Self-pay | Admitting: Internal Medicine

## 2019-05-31 NOTE — Telephone Encounter (Signed)
Pt already aware. Nothing further needed.

## 2019-06-01 ENCOUNTER — Ambulatory Visit: Payer: 59 | Admitting: Cardiology

## 2019-06-01 ENCOUNTER — Telehealth: Payer: Self-pay | Admitting: Internal Medicine

## 2019-06-02 ENCOUNTER — Telehealth: Payer: Self-pay | Admitting: Cardiology

## 2019-06-02 NOTE — Telephone Encounter (Signed)
Wants to know when he can be rescheduled for his EKG he missed

## 2019-06-02 NOTE — Telephone Encounter (Signed)
Patient informed of new appt on 06/10/19 at 1040, no further questions.

## 2019-06-03 ENCOUNTER — Ambulatory Visit (INDEPENDENT_AMBULATORY_CARE_PROVIDER_SITE_OTHER): Payer: 59 | Admitting: Internal Medicine

## 2019-06-03 ENCOUNTER — Encounter: Payer: Self-pay | Admitting: Internal Medicine

## 2019-06-03 ENCOUNTER — Other Ambulatory Visit: Payer: Self-pay

## 2019-06-03 DIAGNOSIS — J449 Chronic obstructive pulmonary disease, unspecified: Secondary | ICD-10-CM

## 2019-06-03 DIAGNOSIS — R911 Solitary pulmonary nodule: Secondary | ICD-10-CM

## 2019-06-03 NOTE — Telephone Encounter (Signed)
The surgeon will coordinate all this at once as will need covid test  immediately preceeding all that needs to be done

## 2019-06-03 NOTE — Assessment & Plan Note (Signed)
Quit smoking 2015 at wt = 170 lb  - 01/25/2019   Walked RA  2 laps @  approx 272ft each @ fast pace  stopped due to  [place] at wt = 276   Had to cancel pfts this week due to "bad virus" but had already screened neg for covid - will defer repeat pfts to coordinate with T surgery eval which will also likely require repeat covid screening being repeated/ advised.

## 2019-06-03 NOTE — Telephone Encounter (Signed)
Pt called back - pt wants to know if he still needs to have pft since MW is sending him to a surgeon- pt can be reached at [phone] if necessary -pr

## 2019-06-03 NOTE — Telephone Encounter (Signed)
Patient seen in office today. Patient wants to know if he still needs the PFT since MW is recommending that he see a Psychologist, sport and exercise.   MW please advise. Thanks.

## 2019-06-03 NOTE — Assessment & Plan Note (Signed)
LDSCT 11/17/18 that did show 8.7 mm RUL nodule and centrilobular emphysema / lingular tree-in-bud  - repeat CT  05/19/19 increase RUL nodule to 15.6 mm > referred to T surgery   The single nodule is doubling in size and the other micronodules which likely represent MAI are not so this represents IA lung ca in this former smoker unless proven o/w  Discussed in detail all the  indications, usual  risks and alternatives  relative to the benefits with patient who agrees to proceed with T surgery referral - advised may need PET/ pft's preop but better to co-ordinate with T surgery since needs COVID testing in close proximity and quarantine after testing but before any procedures

## 2019-06-03 NOTE — Telephone Encounter (Signed)
ATC pt, voicemail box full. Will leave in triage to f/u on.

## 2019-06-03 NOTE — Progress Notes (Signed)
   Aaron [name], male    DOB: 1961/03/12,     MRN: [id number]   Brief patient profile:  35 yowm quit smoking 2015 at wt 170 with gradual wt gain up to 270 range but definitely noted doe assoc with this in 2019 gradually worse since onset so referred to pulmonary clinic 01/25/2019 by Laverna Peace with abn LDSCT 11/17/18 that did show 8.7 mm RUL nodule and centrilobular emphysema / lingular tree-in-bud      History of Present Illness  01/25/2019  Pulmonary/ 1st office eval/Teresea Donley / not on inhalers at present and not really wanting to start any  Chief Complaint  Patient presents with  . Pulmonary Consult    Referred by Laverna Peace, NP. Pt c/o DOE for the past year. He gets winded walking up hill or stairs.   Dyspnea:  MMRC2 = can't walk a nl pace on a flat grade s sob but does fine slow and flat / one flight sob at top s stopping / still doing yardwork for 10 min Cough: none Sleep: on side / bed is flat/ one pillow  SABA use: none  rec F/u 6 m   Virtual Visit via Telephone Note 06/03/2019   I connected with Aaron [name] on 06/03/19 at  8:45 AM EDT by telephone and verified that I am speaking with the correct person using two identifiers.   I discussed the limitations, risks, security and privacy concerns of performing an evaluation and management service by telephone and the availability of in person appointments. I also discussed with the patient that there may be a patient responsible charge related to this service. The patient expressed understanding and agreed to proceed.   History of Present Illness: Dyspnea:  MMRC1 = can walk nl pace, flat grade, can't hurry or go uphills or steps s sob   Cough: none  Sleeping: able to lie on side / bed is flat, no resp symptoms SABA use: none 02: none    No obvious day to day or daytime variability or assoc excess/ purulent sputum or mucus plugs or hemoptysis or cp or chest tightness, subjective wheeze or overt sinus or hb symptoms.    Also denies any  obvious fluctuation of symptoms with weather or environmental changes or other aggravating or alleviating factors except as outlined above.   Meds reviewed/ med reconciliation completed > no resp rx        Observations/Objective: Sounds great on phone, anxious to have surgery due to pos fm hx     Assessment and Plan: See problem list for active a/p's   Follow Up Instructions: See avs for instructions unique to this ov which includes revised/ updated med list     I discussed the assessment and treatment plan with the patient. The patient was provided an opportunity to ask questions and all were answered. The patient agreed with the plan and demonstrated an understanding of the instructions.   The patient was advised to call back or seek an in-person evaluation if the symptoms worsen or if the condition fails to improve as anticipated.  I provided 25 minutes of non-face-to-face time during this encounter.   Christinia Gully, MD

## 2019-06-03 NOTE — Patient Instructions (Signed)
We will refer you  to Dr Roxan Hockey for consideration of doing R upper lobe excisional bx/ Right upper lobectomy possibly needed   Call if questions - follow up here can be as needed

## 2019-06-03 NOTE — Telephone Encounter (Signed)
Attempted to call pt to schedule pft - vm full -pr

## 2019-06-08 NOTE — Telephone Encounter (Signed)
Called spoke with patient. Let him know Dr. Morrison Old response. Patient voiced understanding. Nothing further needed

## 2019-06-10 ENCOUNTER — Ambulatory Visit (INDEPENDENT_AMBULATORY_CARE_PROVIDER_SITE_OTHER): Payer: 59 | Admitting: Cardiology

## 2019-06-10 ENCOUNTER — Other Ambulatory Visit: Payer: Self-pay

## 2019-06-10 ENCOUNTER — Telehealth: Payer: Self-pay

## 2019-06-10 DIAGNOSIS — E782 Mixed hyperlipidemia: Secondary | ICD-10-CM

## 2019-06-10 DIAGNOSIS — I4819 Other persistent atrial fibrillation: Secondary | ICD-10-CM | POA: Diagnosis not present

## 2019-06-10 DIAGNOSIS — Z720 Tobacco use: Secondary | ICD-10-CM

## 2019-06-10 NOTE — Telephone Encounter (Signed)
Patient informed that he will be scheduled to have a cardioversion performed at [facility] week of 07/04/19, RN will call back on Monday with details.

## 2019-06-10 NOTE — Addendum Note (Signed)
Addended by: Beckey Rutter on: 06/10/2019 02:37 PM   Modules accepted: Orders

## 2019-06-10 NOTE — Progress Notes (Signed)
Patient came in for EKG.  He is on flecainide.  EKG reveals atrial fibrillation with elevated ventricular rate.  Patient will continue current medications.  Questions were answered to his satisfaction.  We will set him up for cardioversion.

## 2019-06-20 ENCOUNTER — Telehealth: Payer: Self-pay | Admitting: Internal Medicine

## 2019-06-20 DIAGNOSIS — R911 Solitary pulmonary nodule: Secondary | ICD-10-CM

## 2019-06-20 NOTE — Telephone Encounter (Signed)
Called and spoke with Patient. Patient wanted to know if referral to Dr. Roxan Hockey had been placed.  Referral for Dr. Roxan Hockey placed.  Patient aware someone will contact him about referral appointment. Nothing further at this time.  Per Dr. Melvyn Novas' last OV note, 06/03/19  Instructions  We will refer you  to Dr Roxan Hockey for consideration of doing R upper lobe excisional bx/ Right upper lobectomy possibly needed   Call if questions - follow up here can be as needed

## 2019-06-22 NOTE — Addendum Note (Signed)
Addended by: Elton Sin on: 06/22/2019 05:16 PM   Modules accepted: Orders

## 2019-06-22 NOTE — Addendum Note (Signed)
Addended by: Beckey Rutter on: 06/22/2019 02:30 PM   Modules accepted: Orders

## 2019-06-23 ENCOUNTER — Telehealth: Payer: Self-pay | Admitting: Internal Medicine

## 2019-06-23 ENCOUNTER — Other Ambulatory Visit: Payer: Self-pay | Admitting: Internal Medicine

## 2019-06-23 ENCOUNTER — Telehealth: Payer: Self-pay | Admitting: Cardiology

## 2019-06-23 NOTE — Telephone Encounter (Signed)
Called and spoke to patient. Scheduled pre-procedure covid testing. Nothing further needed.

## 2019-06-23 NOTE — Telephone Encounter (Signed)
States he's having a procedure at [facility] the week of the 12th but no one has gave him an appt date and time

## 2019-06-27 NOTE — Telephone Encounter (Signed)
Called patient and spouse line to notify pt that cardioversion has been changed from 07/05/19 to 07/07/19 per [facility]. Unable to leave a message on either line.

## 2019-06-27 NOTE — Telephone Encounter (Signed)
No vm setup, unable to leave a message.

## 2019-06-28 ENCOUNTER — Other Ambulatory Visit: Payer: Self-pay | Admitting: Cardiology

## 2019-06-29 ENCOUNTER — Other Ambulatory Visit (HOSPITAL_COMMUNITY)
Admission: RE | Admit: 2019-06-29 | Discharge: 2019-06-29 | Disposition: A | Discharge status: Home / Self Care | Payer: 59 | Source: Ambulatory Visit | Attending: Internal Medicine | Admitting: Internal Medicine

## 2019-06-29 ENCOUNTER — Encounter (HOSPITAL_COMMUNITY)
Admission: RE | Admit: 2019-06-29 | Discharge: 2019-06-29 | Disposition: A | Discharge status: Home / Self Care | Payer: 59 | Source: Ambulatory Visit | Attending: Internal Medicine | Admitting: Internal Medicine

## 2019-06-29 ENCOUNTER — Other Ambulatory Visit: Payer: Self-pay

## 2019-06-29 DIAGNOSIS — Z20828 Contact with and (suspected) exposure to other viral communicable diseases: Secondary | ICD-10-CM | POA: Insufficient documentation

## 2019-06-29 DIAGNOSIS — Z01812 Encounter for preprocedural laboratory examination: Secondary | ICD-10-CM | POA: Insufficient documentation

## 2019-06-29 DIAGNOSIS — R911 Solitary pulmonary nodule: Secondary | ICD-10-CM

## 2019-06-29 LAB — GLUCOSE, CAPILLARY: Glucose-Capillary: 78 mg/dL (ref 70–99)

## 2019-06-29 MED ORDER — FLUDEOXYGLUCOSE F - 18 (FDG) INJECTION
14.3000 | Freq: Once | INTRAVENOUS | Status: AC | PRN
  Administered 2019-06-29: 14.3 via INTRAVENOUS

## 2019-06-30 LAB — SARS CORONAVIRUS 2 (TAT 6-24 HRS): SARS Coronavirus 2: NEGATIVE

## 2019-06-30 NOTE — Progress Notes (Signed)
ATC, NA and VM is full

## 2019-06-30 NOTE — Telephone Encounter (Signed)
Patient informed of schedule change and ok with Thurs, Oct 15 cardioversion.

## 2019-07-01 ENCOUNTER — Ambulatory Visit (INDEPENDENT_AMBULATORY_CARE_PROVIDER_SITE_OTHER): Payer: 59 | Admitting: Internal Medicine

## 2019-07-01 ENCOUNTER — Other Ambulatory Visit: Payer: Self-pay

## 2019-07-01 DIAGNOSIS — J449 Chronic obstructive pulmonary disease, unspecified: Secondary | ICD-10-CM

## 2019-07-01 LAB — PULMONARY FUNCTION TEST
DL/VA % pred: 89 %
DL/VA: 3.77 ml/min/mmHg/L
DLCO unc % pred: 77 %
DLCO unc: 22.7 ml/min/mmHg
FEF 25-75 Post: 3.41 L/sec
FEF 25-75 Pre: 2.54 L/sec
FEF2575-%Change-Post: 33 %
FEF2575-%Pred-Post: 107 %
FEF2575-%Pred-Pre: 80 %
FEV1-%Change-Post: 7 %
FEV1-%Pred-Post: 81 %
FEV1-%Pred-Pre: 76 %
FEV1-Post: 3.16 L
FEV1-Pre: 2.95 L
FEV1FVC-%Change-Post: 1 %
FEV1FVC-%Pred-Pre: 102 %
FEV6-%Change-Post: 5 %
FEV6-%Pred-Post: 82 %
FEV6-%Pred-Pre: 77 %
FEV6-Post: 4.03 L
FEV6-Pre: 3.81 L
FEV6FVC-%Change-Post: 0 %
FEV6FVC-%Pred-Post: 104 %
FEV6FVC-%Pred-Pre: 104 %
FVC-%Change-Post: 5 %
FVC-%Pred-Post: 78 %
FVC-%Pred-Pre: 74 %
FVC-Post: 4.03 L
FVC-Pre: 3.81 L
Post FEV1/FVC ratio: 78 %
Post FEV6/FVC ratio: 100 %
Pre FEV1/FVC ratio: 77 %
Pre FEV6/FVC Ratio: 100 %
RV % pred: 140 %
RV: 3.31 L
TLC % pred: 99 %
TLC: 7.33 L

## 2019-07-01 NOTE — Progress Notes (Signed)
Spoke with pt and notified of results per Dr. Wert. Pt verbalized understanding and denied any questions. 

## 2019-07-01 NOTE — Progress Notes (Signed)
Full PFT performed today. °

## 2019-07-01 NOTE — Progress Notes (Signed)
Pt aware neg covid. Nothing further needed.

## 2019-07-07 ENCOUNTER — Institutional Professional Consult (permissible substitution): Payer: 59 | Admitting: Thoracic Surgery (Cardiothoracic Vascular Surgery)

## 2019-07-07 ENCOUNTER — Encounter: Payer: Self-pay | Admitting: Thoracic Surgery (Cardiothoracic Vascular Surgery)

## 2019-07-07 ENCOUNTER — Other Ambulatory Visit: Payer: Self-pay

## 2019-07-07 VITALS — BP 100/64 | HR 92 | Temp 97.5°F | Resp 16 | Ht 73.0 in | Wt 285.0 lb

## 2019-07-07 DIAGNOSIS — D381 Neoplasm of uncertain behavior of trachea, bronchus and lung: Secondary | ICD-10-CM | POA: Diagnosis not present

## 2019-07-07 DIAGNOSIS — I4819 Other persistent atrial fibrillation: Secondary | ICD-10-CM | POA: Diagnosis not present

## 2019-07-07 DIAGNOSIS — Z7901 Long term (current) use of anticoagulants: Secondary | ICD-10-CM

## 2019-07-07 DIAGNOSIS — I4891 Unspecified atrial fibrillation: Secondary | ICD-10-CM | POA: Diagnosis not present

## 2019-07-07 NOTE — Patient Instructions (Signed)
Stop Eliquis 48 hours prior to surgery

## 2019-07-07 NOTE — Progress Notes (Signed)
PCP is Lowella Dandy, NP Referring Provider is Tanda Rockers, MD  Chief Complaint  Patient presents with  . Lung Lesion    RULobe...eval for BX/SURGERY...CT CHEST 05/19/19, PET 07/01/19, PFT 06/29/19    HPI: Aaron [name] is sent for consultation regarding a right upper lobe lung nodule.  Aaron [name] is a 58 year old male with a history of tobacco abuse, obesity, atrial fibrillation, COPD, and mixed dyslipidemia.  He smoked about a pack and a half a day.  He started at age 65.  He quit 5 years ago at age 48.  He said multiple family members who died of lung cancer.  He had a sister die last year and then another sister died in 2022-10-28.  He went to his primary and asked for a low-dose screening CT.  It showed an 8 mm right apical nodule.  On follow-up CT he had an increased in size to 1.5 cm.  PET/CT showed the nodule was markedly hypermetabolic with an SUV of 11.  His appetite is good.  He denies weight loss.  He does get short of breath with exertion which is felt to be due to his atrial fibrillation.  He recently had his dose of flecainide readjusted.  He was supposed to have cardioversion today, but had missed a dose of his Eliquis and that procedure was canceled.  He can walk up a flight of stairs but it takes a minute to catch his breath after he does so.  He is not having any chest pain, pressure, or tightness. Zubrod Score: At the time of surgery this patient's most appropriate activity status/level should be described as: []     0    Normal activity, no symptoms [x]     1    Restricted in physical strenuous activity but ambulatory, able to do out light work []     2    Ambulatory and capable of self care, unable to do work activities, up and about >50 % of waking hours                              []     3    Only limited self care, in bed greater than 50% of waking hours []     4    Completely disabled, no self care, confined to bed or chair []     5    Moribund   Patient Active Problem List   Diagnosis Date Noted  . Tobacco abuse 07/07/2019  . Mixed dyslipidemia 03/17/2019  . Insomnia 02/16/2019  . Atrial fibrillation ([place]) 02/16/2019  . COPD  GOLD 0 = CT criteria only  01/26/2019  . Solitary pulmonary nodule on lung CT 01/26/2019     Past Surgical History:  Procedure Laterality Date  . BACK SURGERY    . CHOLECYSTECTOMY      Family History  Problem Relation Age of Onset  . Lung cancer Mother        smoked  . Lung cancer Father        smoked  . Lung cancer Sister        smoked  . Lung cancer Brother        smoked    Social History Social History   Tobacco Use  . Smoking status: Former Smoker    Packs/day: 1.50    Years: 40.00    Pack years: 60.00    Types: Cigarettes    Quit date: 09/22/2013  Years since quitting: 5.7  . Smokeless tobacco: Never Used  Substance Use Topics  . Alcohol use: Not on file  . Drug use: Not on file    Current Outpatient Medications  Medication Sig Dispense Refill  . atorvastatin (LIPITOR) 10 MG tablet Take 10 mg by mouth daily.    Marland Kitchen ELIQUIS 5 MG TABS tablet Take 5 mg by mouth 2 (two) times daily.    Marland Kitchen escitalopram (LEXAPRO) 10 MG tablet Take 1 tablet by mouth daily.    . flecainide (TAMBOCOR) 150 MG tablet Take 1 tablet (150 mg total) by mouth 2 (two) times daily. 90 tablet 3  . metoprolol succinate (TOPROL-XL) 50 MG 24 hr tablet TAKE 1/2 TABLET BY MOUTH EVERY MORNING and TAKE ONE TABLET BY MOUTH at night with or immediately following a meal 30 tablet 4  . temazepam (RESTORIL) 15 MG capsule Take 1 capsule by mouth daily.     No current facility-administered medications for this visit.     Allergies  Allergen Reactions  . Codeine   . Penicillins     Review of Systems  Constitutional: Positive for fatigue. Negative for activity change and unexpected weight change.  HENT: Positive for dental problem. Negative for trouble swallowing and voice change.   Respiratory: Positive for shortness of breath. Negative for cough  and wheezing.   Cardiovascular: Positive for palpitations and leg swelling (Mild after being on his feet all day). Negative for chest pain.  Gastrointestinal: Negative for abdominal distention and abdominal pain.  Genitourinary: Negative for difficulty urinating and dysuria.  Musculoskeletal: Negative for arthralgias and myalgias.  Neurological: Negative for dizziness, seizures and syncope.  Hematological: Negative for adenopathy. Does not bruise/bleed easily.  Psychiatric/Behavioral: Positive for dysphoric mood. The patient is nervous/anxious.     BP 100/64 (BP Location: Right Arm, Patient Position: Sitting, Cuff Size: Large) Comment: 100/64  Pulse 92 Comment: IRREG  Temp (!) 97.5 F (36.4 C)   Resp 16   Ht 6\' 1"  (1.854 m)   Wt 285 lb (129.3 kg)   SpO2 95% Comment: ON RA  BMI 37.60 kg/m  Physical Exam Vitals signs reviewed.  Constitutional:      General: He is not in acute distress.    Appearance: He is obese.  HENT:     Head: Normocephalic and atraumatic.  Eyes:     General: No scleral icterus.    Extraocular Movements: Extraocular movements intact.  Neck:     Musculoskeletal: Neck supple. No neck rigidity.  Cardiovascular:     Rate and Rhythm: Normal rate. Rhythm irregular.     Heart sounds: No murmur.  Pulmonary:     Effort: Pulmonary effort is normal. No respiratory distress.     Breath sounds: No wheezing or rales.  Abdominal:     General: There is no distension.     Palpations: Abdomen is soft.     Tenderness: There is no abdominal tenderness.  Musculoskeletal:        General: No swelling.  Lymphadenopathy:     Cervical: No cervical adenopathy.  Skin:    General: Skin is warm and dry.  Neurological:     General: No focal deficit present.     Mental Status: He is alert and oriented to person, place, and time.     Cranial Nerves: No cranial nerve deficit.     Motor: No weakness.     Gait: Gait normal.    Diagnostic Tests: NUCLEAR MEDICINE PET SKULL BASE  TO THIGH  TECHNIQUE:  14.3 mCi F-18 FDG was injected intravenously. Full-ring PET imaging was performed from the skull base to thigh after the radiotracer. CT data was obtained and used for attenuation correction and anatomic localization.  Fasting blood glucose: 78 mg/dl  COMPARISON:  CT chest 05/19/2019  FINDINGS: Mediastinal blood pool activity: SUV max 2.6  Liver activity: SUV max NA  NECK: Physiologic activity along the palatine and lingual tonsillar tissues, relatively symmetric. No hypermetabolic or pathologically enlarged adenopathy in the neck.  Incidental CT findings: none  CHEST: The 2.2 by 1.1 cm right upper lobe pulmonary nodule has maximum SUV of 11.2, highly suspicious for malignancy. No hypermetabolic or pathologic adenopathy identified.  Incidental CT findings: Stable emphysema. Left anterior descending coronary artery atherosclerosis and right coronary artery atherosclerosis. Mild cardiomegaly.  ABDOMEN/PELVIS: No significant abnormal hypermetabolic activity in this region.  Incidental CT findings: Cholecystectomy. Aortoiliac atherosclerotic vascular disease.  SKELETON: No significant abnormal hypermetabolic activity in this region.  Incidental CT findings: none  IMPRESSION: 1. The right upper lobe nodules highly hypermetabolic with maximum SUV of 11.2, most compatible with malignancy. No adenopathy or metastatic spread identified. 2. Aortic Atherosclerosis (ICD10-I70.0) and Emphysema (ICD10-J43.9). Coronary atherosclerosis.   Electronically Signed   By: Van Clines M.D.   On: 06/29/2019 14:58 I personally reviewed the PET/CT images and concur with the findings noted above  Pulmonary function testing FVC 3.81 (74%) FEV1 2.95 (76%) FEV1 3.6 (81%) postbronchodilator RV 3.31 (140%) DLCO 22.70 (77%)  Impression: Aaron [name] is a 58 year old man with a history of tobacco abuse, COPD, atrial fibrillation, obesity,  hyperlipidemia, and a strong family history of lung cancer.  He was found to have a right apical nodule on a low-dose screening CT in February 2020.  On follow-up the nodule had increased in size.  On PET CT it is markedly hypermetabolic with an SUV of greater than 11.  In all likelihood, this nodule is a new primary bronchogenic carcinoma.  It has to be considered that unless it can be proven otherwise.  Infectious and inflammatory nodules are also in the differential diagnosis.  The nodule still relatively small in size, is very peripheral, and is directly adjacent to a bleb.  This is not favorable for bronchoscopic or CT-guided biopsy.  Given the high index of suspicion, surgical resection for an excisional biopsy is the best option for diagnosis.  I discussed the proposed surgical procedure of right VATS for wedge resection and possible right upper lobectomy with Mr. and Mrs. Hungate.  I informed them of the general nature of the procedure including the need for general anesthesia, the incisions to be used, the intraoperative decision making, the use of drains to postoperatively, the expected hospital stay, and the overall recovery.  I informed them of the indications, risks, benefits, and alternatives.  They understand the risks include, but are not limited to death, MI, DVT, PE, stroke, bleeding, possible need for transfusion, possible need for conversion to thoracotomy, infection, prolonged air leak, respiratory or renal failure, as well as the possibility of other unforeseeable complications.  He accepts the risks of surgery and wishes to proceed as quickly as possible.  He was scheduled to have a cardioversion for his atrial fibrillation today.  That had to be canceled because he had missed a dose of Eliquis over the weekend.  I think his atrial fibrillation is playing a significant role in his dyspnea.  He does have some COPD and is overweight but is pulmonary function testing shows adequate  function.  I  will contact Dr. Geraldo Pitter and see what his plan is in terms of cardioversion for atrial fibrillation.  If that can be done and still do a surgery within a few weeks, I would prefer that be done first.  If you would delay surgery significantly then I think we should go ahead and proceed with VATS.  He knows he will have to hold his Eliquis for 48 hours prior to surgery.  Plan: We will check with Dr. Geraldo Pitter regarding cardioversion Right VATS for wedge resection and possible right upper lobectomy.  We will schedule once cardiac issues addressed.  Melrose Nakayama, MD [facility] [phone]

## 2019-07-08 ENCOUNTER — Telehealth: Payer: Self-pay | Admitting: Cardiology

## 2019-07-08 NOTE — Telephone Encounter (Signed)
Patient states he called earlier and would like a call back please.

## 2019-07-11 ENCOUNTER — Telehealth: Payer: Self-pay | Admitting: Cardiology

## 2019-07-11 NOTE — Telephone Encounter (Signed)
Patient asked to speak to you directly

## 2019-07-11 NOTE — Telephone Encounter (Signed)
So patient states he was diagnosed with lung cancer and needs urgent intervention. Cardioversion was cancelled but Afib is still a severe issue and he is taking increased dose of flecainide 150 mg which was in anticipation of cancelled cardioversion since 05/27/19. Patient states he has not missed any of his eliquis doses and taking all other meds as prescribed. Added to Dr. Harriet Masson schedule tomorrow to reschedule cardioversion

## 2019-07-11 NOTE — Telephone Encounter (Signed)
Patient called on Friday wanting instructions after cancelled cardioversion. Dr. Docia Furl consulted and states he will review pt records and get back to him.

## 2019-07-12 ENCOUNTER — Encounter: Payer: Self-pay | Admitting: Cardiology

## 2019-07-12 ENCOUNTER — Other Ambulatory Visit: Payer: Self-pay

## 2019-07-12 ENCOUNTER — Other Ambulatory Visit (HOSPITAL_COMMUNITY)
Admission: RE | Admit: 2019-07-12 | Discharge: 2019-07-12 | Disposition: A | Discharge status: Home / Self Care | Payer: 59 | Source: Ambulatory Visit | Attending: Internal Medicine | Admitting: Internal Medicine

## 2019-07-12 ENCOUNTER — Ambulatory Visit (INDEPENDENT_AMBULATORY_CARE_PROVIDER_SITE_OTHER): Payer: 59 | Admitting: Cardiology

## 2019-07-12 VITALS — BP 100/70 | HR 95 | Ht 73.0 in | Wt 283.0 lb

## 2019-07-12 DIAGNOSIS — E782 Mixed hyperlipidemia: Secondary | ICD-10-CM

## 2019-07-12 DIAGNOSIS — I4811 Longstanding persistent atrial fibrillation: Secondary | ICD-10-CM

## 2019-07-12 DIAGNOSIS — Z01812 Encounter for preprocedural laboratory examination: Secondary | ICD-10-CM | POA: Diagnosis not present

## 2019-07-12 DIAGNOSIS — Z720 Tobacco use: Secondary | ICD-10-CM | POA: Diagnosis not present

## 2019-07-12 DIAGNOSIS — Z20828 Contact with and (suspected) exposure to other viral communicable diseases: Secondary | ICD-10-CM | POA: Diagnosis not present

## 2019-07-12 DIAGNOSIS — R911 Solitary pulmonary nodule: Secondary | ICD-10-CM

## 2019-07-12 NOTE — Patient Instructions (Signed)
Medication Instructions:  Your physician recommends that you continue on your current medications as directed. Please refer to the Current Medication list given to you today.  *If you need a refill on your cardiac medications before your next appointment, please call your pharmacy*  Lab Work: Your physician recommends that you return for lab work in: [place] CBC,BMP  If you have labs (blood work) drawn today and your tests are completely normal, you will receive your results only by: Marland Kitchen MyChart Message (if you have MyChart) OR . A paper copy in the mail If you have any lab test that is abnormal or we need to change your treatment, we will call you to review the results.  Testing/Procedures: Your physician has recommended that you have a Cardioversion (DCCV). Electrical Cardioversion uses a jolt of electricity to your heart either through paddles or wired patches attached to your chest. This is a controlled, usually prescheduled, procedure. Defibrillation is done under light anesthesia in the hospital, and you usually go home the day of the procedure. This is done to get your heart back into a normal rhythm. You are not awake for the procedure. Please see the instruction sheet given to you today.  Dear Aaron [name]  You are scheduled for a TEE/Cardioversion/TEE Cardioversion on Oct 23 ,2020 with Dr. Debara Pickett.  Please arrive at the [facility] (Main Entrance A) at [facility]: [address] [place], [place] [address] at 8 am. (1 hour prior to procedure unless lab work is needed; if lab work is needed arrive 1.5 hours ahead)  DIET: Nothing to eat or drink after midnight except a sip of water with medications (see medication instructions below)  Medication Instructions: Take all medications  Continue your anticoagulant: Eliquis 5 mg twice daily You will need to continue your anticoagulant after your procedure until you  are told by your  Provider that it is safe to stop   Labs: COVID  screen scheduled for 07/12/19 at 11:50Am at [place] must have a responsible person to drive you home and stay in the waiting area during your procedure. Failure to do so could result in cancellation.  Bring your insurance cards.  *Special Note: Every effort is made to have your procedure done on time. Occasionally there are emergencies that occur at the hospital that may cause delays. Please be patient if a delay does occur.    Follow-Up: At [facility], you and your health needs are our priority.  As part of our continuing mission to provide you with exceptional heart care, we have created designated Provider Care Teams.  These Care Teams include your primary Cardiologist (physician) and Advanced Practice Providers (APPs -  Physician Assistants and Nurse Practitioners) who all work together to provide you with the care you need, when you need it.  Your next appointment:   1 month  The format for your next appointment:   In Person  Provider:   Jyl Heinz, MD  Other Instructions

## 2019-07-12 NOTE — Progress Notes (Signed)
Cardiology Office Note:    Date:  07/12/2019   ID:  Shail Urbas, DOB 1961-08-22, MRN [id number]  PCP:  Lowella Dandy, NP  Cardiologist:  No primary care provider on file.  Electrophysiologist:  None   Referring MD: Lowella Dandy, NP   Follow-up  History of Present Illness:    Aaron [name] is a 58 y.o. male with history of persistent atrial fibrillation on Eliquis, and flecainide 150 twice daily, dyslipidemia.  Patient usually follows with Dr. Geraldo Pitter.  Patient last did see Dr. Geraldo Pitter on May 27, 2019 at which time his flecainide was increased from 100 mg twice daily to 150 mg twice daily.  During our visit they did discuss rate control, rhythm control and stroke prevention.  On June 10, 2019 and the patient and Dr. Geraldo Pitter agreed to proceed with cardioversion.  Unfortunately today of his cardioversion it was canceled due to the patient missing anticoagulation doses few days prior.  He is here for follow-up visit to understand his options as he still does want to proceed with rhythm control.     History reviewed. No pertinent past medical history.  Past Surgical History:  Procedure Laterality Date   BACK SURGERY     CHOLECYSTECTOMY      Current Medications: Current Meds  Medication Sig   atorvastatin (LIPITOR) 10 MG tablet Take 10 mg by mouth daily.   ELIQUIS 5 MG TABS tablet Take 5 mg by mouth 2 (two) times daily.   escitalopram (LEXAPRO) 10 MG tablet Take 1 tablet by mouth daily.   flecainide (TAMBOCOR) 150 MG tablet Take 1 tablet (150 mg total) by mouth 2 (two) times daily.   metoprolol succinate (TOPROL-XL) 50 MG 24 hr tablet TAKE 1/2 TABLET BY MOUTH EVERY MORNING and TAKE ONE TABLET BY MOUTH at night with or immediately following a meal   temazepam (RESTORIL) 15 MG capsule Take 1 capsule by mouth daily.     Allergies:   Codeine and Penicillins   Social History   Socioeconomic History   Marital status: Married    Spouse name: Not on file   Number  of children: Not on file   Years of education: Not on file   Highest education level: Not on file  Occupational History   Not on file  Social Needs   Financial resource strain: Not on file   Food insecurity    Worry: Not on file    Inability: Not on file   Transportation needs    Medical: Not on file    Non-medical: Not on file  Tobacco Use   Smoking status: Former Smoker    Packs/day: 1.50    Years: 40.00    Pack years: 60.00    Types: Cigarettes    Quit date: 09/22/2013    Years since quitting: 5.8   Smokeless tobacco: Never Used  Substance and Sexual Activity   Alcohol use: Not on file   Drug use: Not on file   Sexual activity: Not on file  Lifestyle   Physical activity    Days per week: Not on file    Minutes per session: Not on file   Stress: Not on file  Relationships   Social connections    Talks on phone: Not on file    Gets together: Not on file    Attends religious service: Not on file    Active member of club or organization: Not on file    Attends meetings of clubs or organizations: Not on  file    Relationship status: Not on file  Other Topics Concern   Not on file  Social History Narrative   Not on file     Family History: The patient's family history includes Lung cancer in his brother, father, mother, and sister.  ROS:   Review of Systems  Constitution: Negative for decreased appetite, fever and weight gain.  HENT: Negative for congestion, ear discharge, hoarse voice and sore throat.   Eyes: Negative for discharge, redness, vision loss in right eye and visual halos.  Cardiovascular: Negative for chest pain, dyspnea on exertion, leg swelling, orthopnea and palpitations.  Respiratory: Negative for cough, hemoptysis, shortness of breath and snoring.   Endocrine: Negative for heat intolerance and polyphagia.  Hematologic/Lymphatic: Negative for bleeding problem. Does not bruise/bleed easily.  Skin: Negative for flushing, nail changes,  rash and suspicious lesions.  Musculoskeletal: Negative for arthritis, joint pain, muscle cramps, myalgias, neck pain and stiffness.  Gastrointestinal: Negative for abdominal pain, bowel incontinence, diarrhea and excessive appetite.  Genitourinary: Negative for decreased libido, genital sores and incomplete emptying.  Neurological: Negative for brief paralysis, focal weakness, headaches and loss of balance.  Psychiatric/Behavioral: Negative for altered mental status, depression and suicidal ideas.  Allergic/Immunologic: Negative for HIV exposure and persistent infections.    EKGs/Labs/Other Studies Reviewed:    The following studies were reviewed today:  EKG:  The ekg ordered today demonstrates atrial fibrillation with controlled ventricular rate.  Heart rate 95 bpm.  Similar to previous EKG.  Transthoracic echocardiogram 03/07/2019 IMPRESSIONS:  1. The left ventricle has normal systolic function, with an ejection fraction of 55-60%. The cavity size was normal. Left ventricular diastolic Doppler parameters are indeterminate.  2. The right ventricle has normal systolic function. The cavity was normal. There is no increase in right ventricular wall thickness.  3. Left atrial size was moderately dilated.  4. Right atrial size was mildly dilated.  5. The aortic valve has an indeterminate number of cusps. Aortic valve regurgitation was not assessed by color flow Doppler.  Pharmacologic nuclear stress test, 05/12/2019  The left ventricular ejection fraction is mildly decreased (45-54%).  Nuclear stress EF: 52%.  There was no ST segment deviation noted during stress.  The study is normal.  This is a low risk study.    Recent Labs: 03/14/2019: BUN 17; Creatinine, Ser 0.93; Potassium 4.1; Sodium 139  Recent Lipid Panel No results found for: CHOL, TRIG, HDL, CHOLHDL, VLDL, LDLCALC, LDLDIRECT  Physical Exam:    VS:  BP 100/70    Pulse 95    Ht 6\' 1"  (1.854 m)    Wt 283 lb (128.4 kg)     SpO2 97%    BMI 37.34 kg/m     Wt Readings from Last 3 Encounters:  07/12/19 283 lb (128.4 kg)  07/07/19 285 lb (129.3 kg)  05/27/19 281 lb (127.5 kg)    GEN: Patient is obese, well nourished, well developed in no acute distress HEENT: Normal NECK: No JVD; No carotid bruits LYMPHATICS: No lymphadenopathy CARDIAC: S1S2 noted,RRR, no murmurs, rubs, gallops RESPIRATORY:  Clear to auscultation without rales, wheezing or rhonchi  ABDOMEN: Soft, non-tender, non-distended, +bowel sounds, no guarding. EXTREMITIES: No edema, No cyanosis, no clubbing MUSCULOSKELETAL:  No edema; No deformity  SKIN: Warm and dry NEUROLOGIC:  Alert and oriented x 3, non-focal PSYCHIATRIC:  Normal affect, good insight  ASSESSMENT:    1. Longstanding persistent atrial fibrillation ([place])   2. Mixed dyslipidemia   3. Tobacco abuse   4. 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 Pre-procedure lab exam    PLAN:    1.  Patient is in atrial fibrillation today however ventricular rate controlled.  He wants to try rhythm control.  He was previously placed on flecainide 150 mg twice daily.  Today were going to continue this with his Eliquis.  2.  He has had a cardioversion canceled due to missed Eliquis.  He still wants to pursue cardioversion.  TEE/cardioversion is appropriate in his situation.  I have educated patient about the procedure.  I did stress that he should continue his anticoagulation up through his procedure and especially after his cardioversion he cannot stop his anticoagulation for any procedure until after 4 weeks.  Even after 4 weeks he needs to notify our office if he is planning any procedures as it relates to his recently diagnosed lung cancer for [place] to advise on holding anticoagulation.  He expresses understanding and was able to relay back to me my recommendations.  We will schedule patient for TEE/DCCV today.  3 His blood pressure susceptible in the office today.  The patient is in agreement with  the above plan. The patient left the office in stable condition.  The patient will follow up in 1 month with Dr. Geraldo Pitter.   Medication Adjustments/Labs and Tests Ordered: Current medicines are reviewed at length with the patient today.  Concerns regarding medicines are outlined above.  Orders Placed This Encounter  Procedures   CBC   Basic Metabolic Panel (BMET)   EKG 12-Lead   No orders of the defined types were placed in this encounter.   Patient Instructions  Medication Instructions:  Your physician recommends that you continue on your current medications as directed. Please refer to the Current Medication list given to you today.  *If you need a refill on your cardiac medications before your next appointment, please call your pharmacy*  Lab Work: Your physician recommends that you return for lab work in: [place] CBC,BMP  If you have labs (blood work) drawn today and your tests are completely normal, you will receive your results only by:  [place] (if you have [place]) OR  A paper copy in the mail If you have any lab test that is abnormal or we need to change your treatment, we will call you to review the results.  Testing/Procedures: Your physician has recommended that you have a Cardioversion (DCCV). Electrical Cardioversion uses a jolt of electricity to your heart either through paddles or wired patches attached to your chest. This is a controlled, usually prescheduled, procedure. Defibrillation is done under light anesthesia in the hospital, and you usually go home the day of the procedure. This is done to get your heart back into a normal rhythm. You are not awake for the procedure. Please see the instruction sheet given to you today.  Dear Berniece Pap  You are scheduled for a TEE/Cardioversion/TEE Cardioversion on Oct 23 ,2020 with Dr. Debara Pickett.  Please arrive at the [facility] (Main Entrance A) at [facility]: [address] [place],  [address] at 8  am. (1 hour prior to procedure unless lab work is needed; if lab work is needed arrive 1.5 hours ahead)  DIET: Nothing to eat or drink after midnight except a sip of water with medications (see medication instructions below)  Medication Instructions: Take all medications  Continue your anticoagulant: Eliquis 5 mg twice daily You will need to continue your anticoagulant after your procedure until you  are  told by your  Provider that it is safe to stop   Labs: COVID screen scheduled for 07/12/19 at 11:50Am at [place] must have a responsible person to drive you home and stay in the waiting area during your procedure. Failure to do so could result in cancellation.  Bring your insurance cards.  *Special Note: Every effort is made to have your procedure done on time. Occasionally there are emergencies that occur at the hospital that may cause delays. Please be patient if a delay does occur.    Follow-Up: At [facility], you and your health needs are our priority.  As part of our continuing mission to provide you with exceptional heart care, we have created designated Provider Care Teams.  These Care Teams include your primary Cardiologist (physician) and Advanced Practice Providers (APPs -  Physician Assistants and Nurse Practitioners) who all work together to provide you with the care you need, when you need it.  Your next appointment:   1 month  The format for your next appointment:   In Person  Provider:   Jyl Heinz, MD  Other Instructions      Adopting a Healthy Lifestyle.  Know what a healthy weight is for you (roughly BMI <25) and aim to maintain this   Aim for 7+ servings of fruits and vegetables daily   65-80+ fluid ounces of water or unsweet tea for healthy kidneys   Limit to max 1 drink of alcohol per day; avoid smoking/tobacco   Limit animal fats in diet for cholesterol and heart health - choose grass fed whenever available    Avoid highly processed foods, and foods high in saturated/trans fats   Aim for low stress - take time to unwind and care for your mental health   Aim for 150 min of moderate intensity exercise weekly for heart health, and weights twice weekly for bone health   Aim for 7-9 hours of sleep daily   When it comes to diets, agreement about the perfect plan isnt easy to find, even among the experts. Experts at the [employer] developed an idea known as the Healthy Eating Plate. Just imagine a plate divided into logical, healthy portions.   The emphasis is on diet quality:   Load up on vegetables and fruits - one-half of your plate: Aim for color and variety, and remember that potatoes dont count.   Go for whole grains - one-quarter of your plate: Whole wheat, barley, wheat berries, quinoa, oats, brown rice, and foods made with them. If you want pasta, go with whole wheat pasta.   Protein power - one-quarter of your plate: Fish, chicken, beans, and nuts are all healthy, versatile protein sources. Limit red meat.   The diet, however, does go beyond the plate, offering a few other suggestions.   Use healthy plant oils, such as olive, canola, soy, corn, sunflower and peanut. Check the labels, and avoid partially hydrogenated oil, which have unhealthy trans fats.   If youre thirsty, drink water. Coffee and tea are good in moderation, but skip sugary drinks and limit milk and dairy products to one or two daily servings.   The type of carbohydrate in the diet is more important than the amount. Some sources of carbohydrates, such as vegetables, fruits, whole grains, and beans-are healthier than others.   Finally, stay active  Signed, Berniece Salines, DO  07/12/2019 10:14 AM    [place]

## 2019-07-13 LAB — CBC
Hematocrit: 47.1 % (ref 37.5–51.0)
Hemoglobin: 16 g/dL (ref 13.0–17.7)
MCH: 30.7 pg (ref 26.6–33.0)
MCHC: 34 g/dL (ref 31.5–35.7)
MCV: 90 fL (ref 79–97)
Platelets: 306 x10E3/uL (ref 150–450)
RBC: 5.22 x10E6/uL (ref 4.14–5.80)
RDW: 13.1 % (ref 11.6–15.4)
WBC: 10.9 x10E3/uL — ABNORMAL HIGH (ref 3.4–10.8)

## 2019-07-13 LAB — BASIC METABOLIC PANEL
BUN/Creatinine Ratio: 16 (ref 10–24)
BUN: 17 mg/dL (ref 8–27)
CO2: 26 mmol/L (ref 20–29)
Calcium: 9 mg/dL (ref 8.6–10.2)
Chloride: 103 mmol/L (ref 96–106)
Creatinine, Ser: 1.08 mg/dL (ref 0.76–1.27)
GFR calc Af Amer: 86 mL/min/{1.73_m2} (ref >59)
GFR calc non Af Amer: 74 mL/min/{1.73_m2} (ref >59)
Glucose: 86 mg/dL (ref 65–99)
Potassium: 5 mmol/L (ref 3.5–5.2)
Sodium: 142 mmol/L (ref 134–144)

## 2019-07-13 LAB — NOVEL CORONAVIRUS, NAA (HOSP ORDER, SEND-OUT TO REF LAB; TAT 18-24 HRS): SARS-CoV-2, NAA: NOT DETECTED

## 2019-07-15 ENCOUNTER — Encounter (HOSPITAL_COMMUNITY): Payer: Self-pay | Admitting: Anesthesiology

## 2019-07-15 ENCOUNTER — Ambulatory Visit (HOSPITAL_COMMUNITY)
Admission: RE | Admit: 2019-07-15 | Discharge: 2019-07-15 | Disposition: A | Discharge status: Home / Self Care | Payer: 59 | Attending: Internal Medicine | Admitting: Internal Medicine

## 2019-07-15 ENCOUNTER — Encounter (HOSPITAL_COMMUNITY)
Admission: RE | Disposition: A | Discharge status: Home / Self Care | Payer: Self-pay | Source: Home / Self Care | Attending: Internal Medicine

## 2019-07-15 ENCOUNTER — Ambulatory Visit (HOSPITAL_COMMUNITY): Payer: 59 | Admitting: Certified Registered Nurse Anesthetist

## 2019-07-15 ENCOUNTER — Other Ambulatory Visit: Payer: Self-pay

## 2019-07-15 ENCOUNTER — Ambulatory Visit (HOSPITAL_BASED_OUTPATIENT_CLINIC_OR_DEPARTMENT_OTHER)
Admission: RE | Admit: 2019-07-15 | Discharge: 2019-07-15 | Disposition: A | Discharge status: Home / Self Care | Payer: 59 | Source: Ambulatory Visit | Attending: Cardiology | Admitting: Cardiology

## 2019-07-15 DIAGNOSIS — Z6837 Body mass index (BMI) 37.0-37.9, adult: Secondary | ICD-10-CM | POA: Diagnosis not present

## 2019-07-15 DIAGNOSIS — Z79899 Other long term (current) drug therapy: Secondary | ICD-10-CM | POA: Insufficient documentation

## 2019-07-15 DIAGNOSIS — I4811 Longstanding persistent atrial fibrillation: Secondary | ICD-10-CM | POA: Diagnosis present

## 2019-07-15 DIAGNOSIS — R911 Solitary pulmonary nodule: Secondary | ICD-10-CM | POA: Insufficient documentation

## 2019-07-15 DIAGNOSIS — Z885 Allergy status to narcotic agent status: Secondary | ICD-10-CM | POA: Insufficient documentation

## 2019-07-15 DIAGNOSIS — I340 Nonrheumatic mitral (valve) insufficiency: Secondary | ICD-10-CM | POA: Diagnosis not present

## 2019-07-15 DIAGNOSIS — Z880 Allergy status to penicillin: Secondary | ICD-10-CM | POA: Diagnosis not present

## 2019-07-15 DIAGNOSIS — Z87891 Personal history of nicotine dependence: Secondary | ICD-10-CM | POA: Diagnosis not present

## 2019-07-15 DIAGNOSIS — E782 Mixed hyperlipidemia: Secondary | ICD-10-CM | POA: Insufficient documentation

## 2019-07-15 DIAGNOSIS — Z7901 Long term (current) use of anticoagulants: Secondary | ICD-10-CM | POA: Diagnosis not present

## 2019-07-15 DIAGNOSIS — I4891 Unspecified atrial fibrillation: Secondary | ICD-10-CM

## 2019-07-15 DIAGNOSIS — E669 Obesity, unspecified: Secondary | ICD-10-CM | POA: Diagnosis not present

## 2019-07-15 HISTORY — DX: Nausea with vomiting, unspecified: R11.2

## 2019-07-15 HISTORY — DX: Other complications of anesthesia, initial encounter: T88.59XA

## 2019-07-15 HISTORY — PX: TEE WITHOUT CARDIOVERSION: SHX5443

## 2019-07-15 HISTORY — PX: CARDIOVERSION: SHX1299

## 2019-07-15 HISTORY — DX: Other specified postprocedural states: Z98.890

## 2019-07-15 SURGERY — ECHOCARDIOGRAM, TRANSESOPHAGEAL
Anesthesia: General

## 2019-07-15 MED ORDER — PROPOFOL 500 MG/50ML IV EMUL
INTRAVENOUS | Status: DC | PRN
  Administered 2019-07-15: 100 ug/kg/min via INTRAVENOUS

## 2019-07-15 MED ORDER — SODIUM CHLORIDE 0.9 % IV SOLN
INTRAVENOUS | Status: DC
  Administered 2019-07-15: 08:00:00 via INTRAVENOUS

## 2019-07-15 MED ORDER — LIDOCAINE 2% (20 MG/ML) 5 ML SYRINGE
INTRAMUSCULAR | Status: DC | PRN
  Administered 2019-07-15: 100 mg via INTRAVENOUS

## 2019-07-15 MED ORDER — PROPOFOL 10 MG/ML IV BOLUS
INTRAVENOUS | Status: DC | PRN
  Administered 2019-07-15: 20 mg via INTRAVENOUS
  Administered 2019-07-15: 10 mg via INTRAVENOUS
  Administered 2019-07-15: 20 mg via INTRAVENOUS

## 2019-07-15 MED ORDER — ONDANSETRON HCL 4 MG/2ML IJ SOLN
INTRAMUSCULAR | Status: DC | PRN
  Administered 2019-07-15: 4 mg via INTRAVENOUS

## 2019-07-15 NOTE — Anesthesia Postprocedure Evaluation (Signed)
Anesthesia Post Note  Patient: Adult nurse  Procedure(s) Performed: TRANSESOPHAGEAL ECHOCARDIOGRAM (TEE) (N/A ) CARDIOVERSION (N/A )     Patient location during evaluation: Endoscopy Anesthesia Type: General Level of consciousness: awake and alert and oriented Pain management: pain level controlled Vital Signs Assessment: post-procedure vital signs reviewed and stable Respiratory status: spontaneous breathing, nonlabored ventilation and respiratory function stable Cardiovascular status: blood pressure returned to baseline and stable Postop Assessment: no apparent nausea or vomiting Anesthetic complications: no    Last Vitals:  Vitals:   07/15/19 0735 07/15/19 1016  BP: 130/70 (!) 76/49  Pulse: 96 (!) 56  Resp: (!) 23 16  Temp: (!) 36.3 C   SpO2: 96% 99%    Last Pain:  Vitals:   07/15/19 1016  TempSrc:   PainSc: 0-No pain                 Jenice Leiner A.

## 2019-07-15 NOTE — Anesthesia Procedure Notes (Signed)
Date/Time: 07/15/2019 9:40 AM Performed by: Janene Harvey, CRNA Pre-anesthesia Checklist: Patient identified, Emergency Drugs available, Suction available and Patient being monitored Oxygen Delivery Method: Nasal cannula Dental Injury: Teeth and Oropharynx as per pre-operative assessment

## 2019-07-15 NOTE — Progress Notes (Addendum)
  Echocardiogram Echocardiogram transesophageal has been performed.  Aaron [name] 07/15/2019, 10:25 AM

## 2019-07-15 NOTE — Transfer of Care (Signed)
Immediate Anesthesia Transfer of Care Note  Patient: Aaron [name]  Procedure(s) Performed: TRANSESOPHAGEAL ECHOCARDIOGRAM (TEE) (N/A ) CARDIOVERSION (N/A )  Patient Location: Endoscopy Unit  Anesthesia Type:MAC  Level of Consciousness: drowsy  Airway & Oxygen Therapy: Patient Spontanous Breathing and Patient connected to nasal cannula oxygen  Post-op Assessment: Report given to RN and Post -op Vital signs reviewed and stable  Post vital signs: Reviewed  Last Vitals:  Vitals Value Taken Time  BP 76/49 07/15/19 1016  Temp    Pulse 53 07/15/19 1019  Resp 14 07/15/19 1019  SpO2 100 % 07/15/19 1019  Vitals shown include unvalidated device data.  Last Pain:  Vitals:   07/15/19 1016  TempSrc:   PainSc: 0-No pain         Complications: No apparent anesthesia complications

## 2019-07-15 NOTE — CV Procedure (Addendum)
TEE/CARDIOVERSION NOTE  TRANSESOPHAGEAL ECHOCARDIOGRAM (TEE):  Indictation: Atrial Fibrillation  Consent:   Informed consent was obtained prior to the procedure. The risks, benefits and alternatives for the procedure were discussed and the patient comprehended these risks.  Risks include, but are not limited to, cough, sore throat, vomiting, nausea, somnolence, esophageal and stomach trauma or perforation, bleeding, low blood pressure, aspiration, pneumonia, infection, trauma to the teeth and death.    Time Out: Verified patient identification, verified procedure, site/side was marked, verified correct patient position, special equipment/implants available, medications/allergies/relevent history reviewed, required imaging and test results available. Performed  Procedure:  After a procedural time-out, the patient was given propofol per anesthesia for sedation. The patient's heart rate, blood pressure, and oxygen saturation are monitored continuously during the procedure. The transesophageal probe was inserted in the esophagus and stomach without difficulty and multiple views were obtained. Agitated microbubble saline contrast was not administered.  Complications:    Complications: None Patient did tolerate procedure well.  Findings:  1. LEFT VENTRICLE: The left ventricular wall thickness is mildly increased.  The left ventricular cavity is normal in size. Wall motion is normal .  LVEF is 55-60%.  2. RIGHT VENTRICLE:  The right ventricle is normal in structure and function without any thrombus or masses.    3. LEFT ATRIUM:  The left atrium is moderately diated in size without any thrombus or masses.  There is not spontaneous echo contrast ("smoke") in the left atrium consistent with a low flow state.  4. LEFT ATRIAL APPENDAGE:  The left atrial appendage is free of any thrombus or masses. The appendage has single lobes, is small and of cauliflower appearance with a small superior  accessory lobe. Pulse doppler indicates moderate flow in the appendage.  5. ATRIAL SEPTUM:  The atrial septum appears intact and is free of thrombus and/or masses.  There is no evidence for interatrial shunting by color doppler and saline microbubble.  6. RIGHT ATRIUM:  The right atrium is mildly dilated in size and function without any thrombus or masses.  7. MITRAL VALVE:  The mitral valve is mildly thickened with Mild regurgitation.  There were no vegetations or stenosis.  8. AORTIC VALVE:  The aortic valve is trileaflet, normal in structure and function with no regurgitation.  There were no vegetations or stenosis  9. TRICUSPID VALVE:  The tricuspid valve is normal in structure and function with trivial regurgitation.  There were no vegetations or stenosis  10.  PULMONIC VALVE:  The pulmonic valve is normal in structure and function with no regurgitation.  There were no vegetations or stenosis.   11. AORTIC ARCH, ASCENDING AND DESCENDING AORTA:  There was grade 1 Ron Parker et. Al, 1992) atherosclerosis of the ascending aorta, aortic arch, or proximal descending aorta.  12. PULMONARY VEINS: Anomalous pulmonary venous return was not noted.  13. PERICARDIUM: The pericardium appeared normal and non-thickened.  There is no pericardial effusion.  CARDIOVERSION:     Second Time Out: Verified patient identification, verified procedure, site/side was marked, verified correct patient position, special equipment/implants available, medications/allergies/relevent history reviewed, required imaging and test results available.  Performed  Procedure:  1. Patient placed on cardiac monitor, pulse oximetry, supplemental oxygen as necessary.  2. Sedation administered per anesthesia 3. Pacer pads placed anterior and posterior chest. 4. Cardioverted 1 time(s).  5. Cardioverted at 200J biphasic.  Complications:  Complications: None Patient did tolerate procedure well.  Impression:  1. No LAA  thrombus 2. Moderate LAE, mild RAE 3.  Negative for PFO by color doppler 4. Mild MR, trivial TR 5. LVEF 55-60% with normal wall motion 6. Successful DCCV with a single 200J biphasic shock to sinus bradycardia  Recommendations:  1. Continue current medications - follow-up as outpatient with cardiology in [place]. 2. Loud snoring and intermittent airway obstruction noted under anesthesia, given risk factors and afib, recommend formal outpatient sleep study.  Time Spent Directly with the Patient:  60 minutes   Pixie Casino, MD, [facility], [place] Director of the Advanced Lipid Disorders &  Cardiovascular Risk Reduction Clinic Diplomate of the American Board of Clinical Lipidology Attending Cardiologist  Direct Dial: [phone]  Fax: [phone]  Website:  [email or web address] 07/15/2019, 10:11 AM

## 2019-07-15 NOTE — H&P (Signed)
   INTERVAL PROCEDURE H&P  History and Physical Interval Note:  07/15/2019 8:43 AM  Kasandra Knudsen Axford has presented today for their planned procedure. The various methods of treatment have been discussed with the patient and family. After consideration of risks, benefits and other options for treatment, the patient has consented to the procedure.  The patients' outpatient history has been reviewed, patient examined, and no change in status from most recent office note within the past 30 days. I have reviewed the patients' chart and labs and will proceed as planned. Questions were answered to the patient's satisfaction.   Pixie Casino, MD, [facility], [place] Director of the Advanced Lipid Disorders &  Cardiovascular Risk Reduction Clinic Diplomate of the American Board of Clinical Lipidology Attending Cardiologist  Direct Dial: [phone]  Fax: [phone]  Website:  [email or web address] 07/15/2019, 8:43 AM

## 2019-07-15 NOTE — Anesthesia Preprocedure Evaluation (Addendum)
Anesthesia Evaluation  Patient identified by MRN, date of birth, ID band Patient awake    Reviewed: Allergy & Precautions, NPO status , Patient's Chart, lab work & pertinent test results, reviewed documented beta blocker date and time   History of Anesthesia Complications (+) PONV and history of anesthetic complications  Airway Mallampati: II  TM Distance: >3 FB Neck ROM: Full    Dental  (+) Poor Dentition, Missing, Chipped, Dental Advisory Given   Pulmonary COPD, former smoker,    Pulmonary exam normal breath sounds clear to auscultation       Cardiovascular hypertension, Pt. on medications and Pt. on home beta blockers + dysrhythmias Atrial Fibrillation  Rhythm:Irregular Rate:Normal  Flecainide therapy   Neuro/Psych negative neurological ROS  negative psych ROS   GI/Hepatic negative GI ROS,   Endo/Other  Hyperlipidemia Obesity  Renal/GU negative Renal ROS  negative genitourinary   Musculoskeletal negative musculoskeletal ROS (+)   Abdominal (+) + obese,   Peds  Hematology Eliquis therapy- last dose this am   Anesthesia Other Findings   Reproductive/Obstetrics                            Anesthesia Physical Anesthesia Plan  ASA: III  Anesthesia Plan: General   Post-op Pain Management:    Induction: Intravenous  PONV Risk Score and Plan: 2 and Propofol infusion, Treatment may vary due to age or medical condition and Ondansetron  Airway Management Planned: Mask and Nasal Cannula  Additional Equipment:   Intra-op Plan:   Post-operative Plan:   Informed Consent: I have reviewed the patients History and Physical, chart, labs and discussed the procedure including the risks, benefits and alternatives for the proposed anesthesia with the patient or authorized representative who has indicated his/her understanding and acceptance.     Dental advisory given  Plan Discussed with:  CRNA and Surgeon  Anesthesia Plan Comments:         Anesthesia Quick Evaluation

## 2019-07-15 NOTE — Discharge Instructions (Signed)

## 2019-07-17 ENCOUNTER — Encounter (HOSPITAL_COMMUNITY): Payer: Self-pay | Admitting: Internal Medicine

## 2019-07-18 ENCOUNTER — Other Ambulatory Visit: Payer: Self-pay | Admitting: *Deleted

## 2019-07-18 ENCOUNTER — Encounter: Payer: Self-pay | Admitting: *Deleted

## 2019-07-18 DIAGNOSIS — R911 Solitary pulmonary nodule: Secondary | ICD-10-CM

## 2019-07-27 ENCOUNTER — Ambulatory Visit (INDEPENDENT_AMBULATORY_CARE_PROVIDER_SITE_OTHER): Payer: 59 | Admitting: Cardiology

## 2019-07-27 ENCOUNTER — Encounter: Payer: Self-pay | Admitting: Cardiology

## 2019-07-27 ENCOUNTER — Other Ambulatory Visit: Payer: Self-pay

## 2019-07-27 VITALS — BP 108/58 | HR 47 | Ht 72.0 in | Wt 288.4 lb

## 2019-07-27 DIAGNOSIS — E782 Mixed hyperlipidemia: Secondary | ICD-10-CM

## 2019-07-27 DIAGNOSIS — I480 Paroxysmal atrial fibrillation: Secondary | ICD-10-CM | POA: Insufficient documentation

## 2019-07-27 DIAGNOSIS — R911 Solitary pulmonary nodule: Secondary | ICD-10-CM

## 2019-07-27 HISTORY — DX: Paroxysmal atrial fibrillation: I48.0

## 2019-07-27 NOTE — Patient Instructions (Signed)
Medication Instructions:  Your physician recommends that you continue on your current medications as directed. Please refer to the Current Medication list given to you today.  *If you need a refill on your cardiac medications before your next appointment, please call your pharmacy*  Lab Work: NONE If you have labs (blood work) drawn today and your tests are completely normal, you will receive your results only by: Marland Kitchen MyChart Message (if you have MyChart) OR . A paper copy in the mail If you have any lab test that is abnormal or we need to change your treatment, we will call you to review the results.  Testing/Procedures: You have an EKG Performed today  Follow-Up: At [facility], you and your health needs are our priority.  As part of our continuing mission to provide you with exceptional heart care, we have created designated Provider Care Teams.  These Care Teams include your primary Cardiologist (physician) and Advanced Practice Providers (APPs -  Physician Assistants and Nurse Practitioners) who all work together to provide you with the care you need, when you need it.  Your next appointment:   3 months  The format for your next appointment:   In Person  Provider:   Jyl Heinz, MD

## 2019-07-27 NOTE — Progress Notes (Signed)
Cardiology Office Note:    Date:  07/27/2019   ID:  Aaron [name], DOB [birth year], MRN [id number]  PCP:  Lowella Dandy, NP  Cardiologist:  Jenean Lindau, MD   Referring MD: Lowella Dandy, NP    ASSESSMENT:    1. PAF (paroxysmal atrial fibrillation) ([place])   2. Mixed dyslipidemia    PLAN:    In order of problems listed above:  1. Preoperative cardiovascular evaluation for lung nodule: Patient has had a stress test a couple of months ago and this was unremarkable.  His cardioversion went fine and he is in sinus rhythm at this time.  He denies any symptoms.  From the standpoint he is not at high risk for coronary events during the aforementioned surgery.  Meticulous hemodynamic monitoring and continued antiarrhythmic medication should further risk reduce the risk of coronary events.  He is already been advised about anti coagulation by his surgeons. 2. Paroxysmal atrial fibrillation:I discussed with the patient atrial fibrillation, disease process. Management and therapy including rate and rhythm control, anticoagulation benefits and potential risks were discussed extensively with the patient. Patient had multiple questions which were answered to patient's satisfaction. 3. Essential hypertension: Blood pressure stable  4. Patient will be seen in follow-up appointment in 6 months or earlier if the patient has any concerns    Medication Adjustments/Labs and Tests Ordered: Current medicines are reviewed at length with the patient today.  Concerns regarding medicines are outlined above.  Orders Placed This Encounter  Procedures  . EKG 12-Lead   No orders of the defined types were placed in this encounter.    No chief complaint on file.    History of Present Illness:    Aaron [name] is a 58 y.o. male.  Patient has past medical history of paroxysmal atrial fibrillation he underwent TEE and cardioversion and is now in sinus rhythm.  He feels better from that standpoint.  He feels more  energetic.  No chest pain orthopnea or PND.  At the time of my evaluation, the patient is alert awake oriented and in no distress.  Past Medical History:  Diagnosis Date  . Arrhythmia   . Complication of anesthesia   . PONV (postoperative nausea and vomiting)     Past Surgical History:  Procedure Laterality Date  . BACK SURGERY    . CARDIOVERSION N/A 07/15/2019   Procedure: CARDIOVERSION;  Surgeon: Pixie Casino, MD;  Location: [facility] ENDOSCOPY;  Service: Cardiovascular;  Laterality: N/A;  . CHOLECYSTECTOMY    . TEE WITHOUT CARDIOVERSION N/A 07/15/2019   Procedure: TRANSESOPHAGEAL ECHOCARDIOGRAM (TEE);  Surgeon: Pixie Casino, MD;  Location: [facility] ENDOSCOPY;  Service: Cardiovascular;  Laterality: N/A;    Current Medications: Current Meds  Medication Sig  . atorvastatin (LIPITOR) 10 MG tablet Take 10 mg by mouth daily.  Marland Kitchen ELIQUIS 5 MG TABS tablet Take 5 mg by mouth 2 (two) times daily.  Marland Kitchen escitalopram (LEXAPRO) 10 MG tablet Take 10 mg by mouth daily.   . flecainide (TAMBOCOR) 150 MG tablet Take 1 tablet (150 mg total) by mouth 2 (two) times daily.  . metoprolol succinate (TOPROL-XL) 50 MG 24 hr tablet TAKE 1/2 TABLET BY MOUTH EVERY MORNING and TAKE ONE TABLET BY MOUTH at night with or immediately following a meal  . temazepam (RESTORIL) 15 MG capsule Take 15 mg by mouth at bedtime.      Allergies:   Codeine and Penicillins   Social History   Socioeconomic History  . Marital status: Married  Spouse name: Not on file  . Number of children: Not on file  . Years of education: Not on file  . Highest education level: Not on file  Occupational History  . Not on file  Social Needs  . Financial resource strain: Not on file  . Food insecurity    Worry: Not on file    Inability: Not on file  . Transportation needs    Medical: Not on file    Non-medical: Not on file  Tobacco Use  . Smoking status: Former Smoker    Packs/day: 1.50    Years: 40.00    Pack years: 60.00     Types: Cigarettes    Quit date: 09/22/2013    Years since quitting: 5.8  . Smokeless tobacco: Never Used  Substance and Sexual Activity  . Alcohol use: Not Currently  . Drug use: Not Currently  . Sexual activity: Not on file  Lifestyle  . Physical activity    Days per week: Not on file    Minutes per session: Not on file  . Stress: Not on file  Relationships  . Social Herbalist on phone: Not on file    Gets together: Not on file    Attends religious service: Not on file    Active member of club or organization: Not on file    Attends meetings of clubs or organizations: Not on file    Relationship status: Not on file  Other Topics Concern  . Not on file  Social History Narrative  . Not on file     Family History: The patient's family history includes Lung cancer in his brother, father, mother, and sister.  ROS:   Please see the history of present illness.    All other systems reviewed and are negative.  EKGs/Labs/Other Studies Reviewed:    The following studies were reviewed today: Study Highlights   The left ventricular ejection fraction is mildly decreased (45-54%).  Nuclear stress EF: 52%.  There was no ST segment deviation noted during stress.  The study is normal.  This is a low risk study.     IMPRESSIONS TEE    1. Left ventricular ejection fraction, by visual estimation, is 55 to 60%. The left ventricle has normal function. There is mildly increased left ventricular hypertrophy.  2. Global right ventricle has normal systolic function.The right ventricular size is normal. No increase in right ventricular wall thickness.  3. Left atrial size was moderately dilated.  4. Right atrial size was mildly dilated.  5. The mitral valve is abnormal. Mild mitral valve regurgitation.  6. The tricuspid valve is grossly normal. Tricuspid valve regurgitation is trivial.  7. The aortic valve is tricuspid Aortic valve regurgitation was not visualized by color  flow Doppler.  8. The pulmonic valve was grossly normal. Pulmonic valve regurgitation is not visualized by color flow Doppler.    Recent Labs: 07/12/2019: BUN 17; Creatinine, Ser 1.08; Hemoglobin 16.0; Platelets 306; Potassium 5.0; Sodium 142  Recent Lipid Panel No results found for: CHOL, TRIG, HDL, CHOLHDL, VLDL, LDLCALC, LDLDIRECT  Physical Exam:    VS:  BP (!) 108/58 (BP Location: Left Arm, Patient Position: Sitting, Cuff Size: Large)   Pulse (!) 47   Ht 6' (1.829 m)   Wt 288 lb 7 oz (130.8 kg)   SpO2 97%   BMI 39.12 kg/m     Wt Readings from Last 3 Encounters:  07/27/19 288 lb 7 oz (130.8 kg)  07/15/19 283  lb (128.4 kg)  07/12/19 283 lb (128.4 kg)     GEN: Patient is in no acute distress HEENT: Normal NECK: No JVD; No carotid bruits LYMPHATICS: No lymphadenopathy CARDIAC: Hear sounds regular, 2/6 systolic murmur at the apex. RESPIRATORY:  Clear to auscultation without rales, wheezing or rhonchi  ABDOMEN: Soft, non-tender, non-distended MUSCULOSKELETAL:  No edema; No deformity  SKIN: Warm and dry NEUROLOGIC:  Alert and oriented x 3 PSYCHIATRIC:  Normal affect   Signed, Jenean Lindau, MD  07/27/2019 8:54 AM    [place]

## 2019-08-09 ENCOUNTER — Encounter (HOSPITAL_COMMUNITY): Payer: Self-pay

## 2019-08-09 NOTE — Progress Notes (Signed)
[place], [place] [address] Phone: [phone] Fax: [phone]      Your procedure is scheduled on Friday, November 20th, 2020.   Report to [facility] Main Entrance "A" at 5:30 A.M., and check in at the Admitting office.   Call this number if you have problems the morning of surgery:  [phone]  Call [phone] if you have any questions prior to your surgery date Monday-Friday 8am-4pm    Remember:  Do not eat or drink after midnight the night before your surgery   Take these medicines the morning of surgery with A SIP OF WATER :  Flecainide (Tambocor) Metoprolol Succinate (Toprol XL)  7 days prior to surgery STOP taking any Aspirin (unless otherwise instructed by your surgeon), Aleve, Naproxen, Ibuprofen, Motrin, Advil, Goody's, BC's, all herbal medications, fish oil, and all vitamins.    The Morning of Surgery  Do not wear jewelry, make-up or nail polish.  Do not wear lotions, powders, or perfumes/colognes, or deodorant  Do not shave 48 hours prior to surgery.  Men may shave face and neck.  Do not bring valuables to the hospital.  [facility] is not responsible for any belongings or valuables.  If you are a smoker, DO NOT Smoke 24 hours prior to surgery  If you wear a CPAP at night please bring your mask, tubing, and machine the morning of surgery   Remember that you must have someone to transport you home after your surgery, and remain with you for 24 hours if you are discharged the same day.   Please bring cases for contacts, glasses, hearing aids, dentures or bridgework because it cannot be worn into surgery.    Leave your suitcase in the car.  After surgery it may be brought to your room.  For patients admitted to the hospital, discharge time will be determined by your treatment team.  Patients discharged the day of surgery will not be allowed to drive home.    Special instructions:   [facility]- Preparing For Surgery  Before surgery, you can play an important role. Because skin is not sterile, your skin needs to be as free of germs as possible. You can reduce the number of germs on your skin by washing with CHG (chlorahexidine gluconate) Soap before surgery.  CHG is an antiseptic cleaner which kills germs and bonds with the skin to continue killing germs even after washing.    Oral Hygiene is also important to reduce your risk of infection.  Remember - BRUSH YOUR TEETH THE MORNING OF SURGERY WITH YOUR REGULAR TOOTHPASTE  Please do not use if you have an allergy to CHG or antibacterial soaps. If your skin becomes reddened/irritated stop using the CHG.  Do not shave (including legs and underarms) for at least 48 hours prior to first CHG shower. It is OK to shave your face.  Please follow these instructions carefully.   1. Shower the NIGHT BEFORE SURGERY and the MORNING OF SURGERY with CHG Soap.   2. If you chose to wash your hair, wash your hair first as usual with your normal shampoo.  3. After you shampoo, rinse your hair and body thoroughly to remove the shampoo.  4. Use CHG as you would any other liquid soap. You can apply CHG directly to the skin and wash gently with a scrungie or a clean washcloth.   5. Apply the CHG Soap to your body ONLY FROM THE  NECK DOWN.  Do not use on open wounds or open sores. Avoid contact with your eyes, ears, mouth and genitals (private parts). Wash Face and genitals (private parts)  with your normal soap.   6. Wash thoroughly, paying special attention to the area where your surgery will be performed.  7. Thoroughly rinse your body with warm water from the neck down.  8. DO NOT shower/wash with your normal soap after using and rinsing off the CHG Soap.  9. Pat yourself dry with a CLEAN TOWEL.  10. Wear CLEAN PAJAMAS to bed the night before surgery, wear comfortable clothes the morning of surgery  11. Place CLEAN SHEETS on your bed the  night of your first shower and DO NOT SLEEP WITH PETS.    Day of Surgery:  Please shower the morning of surgery with the CHG soap Do not apply any deodorants/lotions. Please wear clean clothes to the hospital/surgery center.   Remember to brush your teeth WITH YOUR REGULAR TOOTHPASTE.   Please read over the following fact sheets that you were given.

## 2019-08-10 ENCOUNTER — Other Ambulatory Visit: Payer: Self-pay

## 2019-08-10 ENCOUNTER — Ambulatory Visit (HOSPITAL_COMMUNITY)
Admission: RE | Admit: 2019-08-10 | Discharge: 2019-08-10 | Disposition: A | Discharge status: Home / Self Care | Payer: 59 | Source: Ambulatory Visit | Attending: Thoracic Surgery (Cardiothoracic Vascular Surgery) | Admitting: Thoracic Surgery (Cardiothoracic Vascular Surgery)

## 2019-08-10 ENCOUNTER — Encounter (HOSPITAL_COMMUNITY)
Admission: RE | Admit: 2019-08-10 | Discharge: 2019-08-10 | Disposition: A | Discharge status: Home / Self Care | Payer: 59 | Source: Ambulatory Visit | Attending: Thoracic Surgery (Cardiothoracic Vascular Surgery) | Admitting: Thoracic Surgery (Cardiothoracic Vascular Surgery)

## 2019-08-10 ENCOUNTER — Other Ambulatory Visit (HOSPITAL_COMMUNITY)
Admission: RE | Admit: 2019-08-10 | Discharge: 2019-08-10 | Disposition: A | Discharge status: Home / Self Care | Payer: 59 | Source: Ambulatory Visit | Attending: Thoracic Surgery (Cardiothoracic Vascular Surgery) | Admitting: Thoracic Surgery (Cardiothoracic Vascular Surgery)

## 2019-08-10 ENCOUNTER — Encounter (HOSPITAL_COMMUNITY): Payer: Self-pay

## 2019-08-10 DIAGNOSIS — E782 Mixed hyperlipidemia: Secondary | ICD-10-CM | POA: Insufficient documentation

## 2019-08-10 DIAGNOSIS — Z87891 Personal history of nicotine dependence: Secondary | ICD-10-CM | POA: Insufficient documentation

## 2019-08-10 DIAGNOSIS — Z79899 Other long term (current) drug therapy: Secondary | ICD-10-CM | POA: Insufficient documentation

## 2019-08-10 DIAGNOSIS — R911 Solitary pulmonary nodule: Secondary | ICD-10-CM

## 2019-08-10 DIAGNOSIS — Z7901 Long term (current) use of anticoagulants: Secondary | ICD-10-CM | POA: Insufficient documentation

## 2019-08-10 DIAGNOSIS — I480 Paroxysmal atrial fibrillation: Secondary | ICD-10-CM | POA: Insufficient documentation

## 2019-08-10 DIAGNOSIS — Z20828 Contact with and (suspected) exposure to other viral communicable diseases: Secondary | ICD-10-CM | POA: Insufficient documentation

## 2019-08-10 DIAGNOSIS — F419 Anxiety disorder, unspecified: Secondary | ICD-10-CM | POA: Insufficient documentation

## 2019-08-10 DIAGNOSIS — Z01818 Encounter for other preprocedural examination: Secondary | ICD-10-CM | POA: Insufficient documentation

## 2019-08-10 DIAGNOSIS — Z01812 Encounter for preprocedural laboratory examination: Secondary | ICD-10-CM | POA: Insufficient documentation

## 2019-08-10 HISTORY — DX: Anxiety disorder, unspecified: F41.9

## 2019-08-10 HISTORY — DX: Mixed hyperlipidemia: E78.2

## 2019-08-10 HISTORY — DX: Paroxysmal atrial fibrillation: I48.0

## 2019-08-10 HISTORY — DX: Cardiac arrhythmia, unspecified: I49.9

## 2019-08-10 LAB — URINALYSIS, ROUTINE W REFLEX MICROSCOPIC
Bilirubin Urine: NEGATIVE
Glucose, UA: NEGATIVE mg/dL
Hgb urine dipstick: NEGATIVE
Ketones, ur: NEGATIVE mg/dL
Leukocytes,Ua: NEGATIVE
Nitrite: NEGATIVE
Protein, ur: NEGATIVE mg/dL
Specific Gravity, Urine: 1.02 (ref 1.005–1.030)
pH: 5 (ref 5.0–8.0)

## 2019-08-10 LAB — SARS CORONAVIRUS 2 (TAT 6-24 HRS): SARS Coronavirus 2: NEGATIVE

## 2019-08-10 LAB — APTT: aPTT: 35 seconds (ref 24–36)

## 2019-08-10 LAB — COMPREHENSIVE METABOLIC PANEL
ALT: 22 U/L (ref 0–44)
AST: 20 U/L (ref 15–41)
Albumin: 3.4 g/dL — ABNORMAL LOW (ref 3.5–5.0)
Alkaline Phosphatase: 67 U/L (ref 38–126)
Anion gap: 10 (ref 5–15)
BUN: 11 mg/dL (ref 6–20)
CO2: 23 mmol/L (ref 22–32)
Calcium: 8.8 mg/dL — ABNORMAL LOW (ref 8.9–10.3)
Chloride: 104 mmol/L (ref 98–111)
Creatinine, Ser: 0.88 mg/dL (ref 0.61–1.24)
GFR calc Af Amer: >60 mL/min (ref >60)
GFR calc non Af Amer: >60 mL/min (ref >60)
Glucose, Bld: 85 mg/dL (ref 70–99)
Potassium: 4.2 mmol/L (ref 3.5–5.1)
Sodium: 137 mmol/L (ref 135–145)
Total Bilirubin: 0.9 mg/dL (ref 0.3–1.2)
Total Protein: 7.2 g/dL (ref 6.5–8.1)

## 2019-08-10 LAB — BLOOD GAS, ARTERIAL
Acid-Base Excess: 1.2 mmol/L (ref 0.0–2.0)
Bicarbonate: 25.3 mmol/L (ref 20.0–28.0)
Drawn by: 42180
FIO2: 21
O2 Saturation: 96.3 %
Patient temperature: 37
pCO2 arterial: 40.7 mmHg (ref 32.0–48.0)
pH, Arterial: 7.41 (ref 7.350–7.450)
pO2, Arterial: 83.6 mmHg (ref 83.0–108.0)

## 2019-08-10 LAB — CBC
HCT: 46.6 % (ref 39.0–52.0)
Hemoglobin: 14.8 g/dL (ref 13.0–17.0)
MCH: 30.1 pg (ref 26.0–34.0)
MCHC: 31.8 g/dL (ref 30.0–36.0)
MCV: 94.7 fL (ref 80.0–100.0)
Platelets: 260 10*3/uL (ref 150–400)
RBC: 4.92 MIL/uL (ref 4.22–5.81)
RDW: 12.4 % (ref 11.5–15.5)
WBC: 9.2 10*3/uL (ref 4.0–10.5)
nRBC: 0 % (ref 0.0–0.2)

## 2019-08-10 LAB — SURGICAL PCR SCREEN
MRSA, PCR: NEGATIVE
Staphylococcus aureus: NEGATIVE

## 2019-08-10 NOTE — Progress Notes (Signed)
PCP: Laverna Peace, NP Cardiologist:  Jyl Heinz, MD  EKG:  07/27/19 CXR:  08/10/19 ECHO:  07/15/19 Stress Test:  05/12/19 Cardiac Cath: denies  Covid test 08/10/19  Blood Thinner:  Last dose Eliquis 08/09/19  Anesthesia Review:  Yes, hx AFIB, PAF   Patient denies shortness of breath, fever, cough, and chest pain at PAT appointment.  Patient verbalized understanding of instructions provided today at the PAT appointment.  Patient asked to review instructions at home and day of surgery.

## 2019-08-11 MED ORDER — VANCOMYCIN HCL 10 G IV SOLR
1500.0000 mg | INTRAVENOUS | Status: AC
  Administered 2019-08-12 (×2): 1500 mg via INTRAVENOUS
  Filled 2019-08-11: qty 1500

## 2019-08-11 NOTE — Progress Notes (Signed)
Anesthesia Chart Review:  Case: [id number] Date/Time: 08/12/19 0715   Procedures:      VIDEO ASSISTED THORACOSCOPY (VATS)/WEDGE RESECTION (Right Chest)     POSSIBLE UPPER LOBECTOMY (Right )   Anesthesia type: General   Pre-op diagnosis: RIGHT LUNG NODULE   Location: [facility] OR ROOM 10 / [place] OR   Surgeon: Melrose Nakayama, MD      DISCUSSION: Patient is a 58 year old male scheduled for the above procedure.   History includes former smoker (quit 09/22/13), post-operative N/V, afib/PAF (diagnosed 01/2019; s/p DCCV (07/15/19), dyslipidemia, anxiety. BMI is consistent with obesity.   Last seen by cardiologist Dr. Geraldo Pitter on 07/27/19. He wrote: "Preoperative cardiovascular evaluation for lung nodule: Patient has had a stress test a couple of months ago and this was unremarkable.  His cardioversion went fine and he is in sinus rhythm at this time.  He denies any symptoms.  From the standpoint he is not at high risk for coronary events during the aforementioned surgery.  Meticulous hemodynamic monitoring and continued antiarrhythmic medication should further risk reduce the risk of coronary events.  He is already been advised about anti coagulation by his surgeons." Last Eliquis 08/09/19.   HR 48 bpm at PAT by vitals, which was consistent with HR/EKG at 07/27/19 cardiology visit. HR 52-60 on 07/15/19 post DCCV.   He is for PT/INR on the day of surgery. 08/10/19 COVID-19 test negative. Anesthesia team to evaluate on the day of surgery.   VS: BP 112/81   Pulse (!) 48   Temp 36.7 C (Oral)   Resp 16   Ht 6' (1.829 m)   Wt 128.2 kg   SpO2 94%   BMI 38.32 kg/m  HR consistent with 07/27/19 EKG.   PROVIDERS: Lowella Dandy, NP is PCP  Jyl Heinz, MD is cardiologist.  Christinia Gully, MD is pulmonologist. See in 01/2019 for dyspnea with abnormal lung cancer screening CT.   LABS: Labs reviewed: Acceptable for surgery. (all labs ordered are listed, but only abnormal results are displayed)  Labs  Reviewed  COMPREHENSIVE METABOLIC PANEL - Abnormal; Notable for the following components:      Result Value   Calcium 8.8 (*)    Albumin 3.4 (*)    All other components within normal limits  SURGICAL PCR SCREEN  APTT  BLOOD GAS, ARTERIAL  CBC  URINALYSIS, ROUTINE W REFLEX MICROSCOPIC  TYPE AND SCREEN    PFTs 07/01/19: FVC 3.81 (74%), post 4.03 (78%). FEV1 2.95 (76%), post 3.16 (81%). DLCO unc 22.70 (77%).    IMAGES: CXR 08/10/19: IMPRESSION: Stable spiculated nodular density in right lung apex. No acute findings.  PET Scan 06/29/19: IMPRESSION: 1. The right upper lobe nodules highly hypermetabolic with maximum SUV of 11.2, most compatible with malignancy. No adenopathy or metastatic spread identified. 2. Aortic Atherosclerosis (ICD10-I70.0) and Emphysema (ICD10-J43.9). Coronary atherosclerosis.   EKG: 07/27/19: SB at 47 bpm.    CV: TEE (with DCCV) 07/15/19: IMPRESSIONS  1. Left ventricular ejection fraction, by visual estimation, is 55 to 60%. The left ventricle has normal function. There is mildly increased left ventricular hypertrophy.  2. Global right ventricle has normal systolic function.The right ventricular size is normal. No increase in right ventricular wall thickness.  3. Left atrial size was moderately dilated.  4. Right atrial size was mildly dilated.  5. The mitral valve is abnormal. Mild mitral valve regurgitation.  6. The tricuspid valve is grossly normal. Tricuspid valve regurgitation is trivial.  7. The aortic valve is tricuspid  Aortic valve regurgitation was not visualized by color flow Doppler.  8. The pulmonic valve was grossly normal. Pulmonic valve regurgitation is not visualized by color flow Doppler.   Nuclear stress test 05/12/19:  The left ventricular ejection fraction is mildly decreased (45-54%).  Nuclear stress EF: 52%.  There was no ST segment deviation noted during stress.  The study is normal.  This is a low risk study.     Past Medical History:  Diagnosis Date  . Anxiety   . Arrhythmia   . Complication of anesthesia   . Dysrhythmia    afib  . Mixed dyslipidemia   . Paroxysmal atrial fibrillation ([facility])   . PONV (postoperative nausea and vomiting)     Past Surgical History:  Procedure Laterality Date  . BACK SURGERY    . CARDIOVERSION N/A 07/15/2019   Procedure: CARDIOVERSION;  Surgeon: Pixie Casino, MD;  Location[facility] ENDOSCOPY;  Service: Cardiovascular;  Laterality: N/A;  . CHOLECYSTECTOMY    . TEE WITH CARDIOVERSION    . TEE WITHOUT CARDIOVERSION N/A 07/15/2019   Procedure: TRANSESOPHAGEAL ECHOCARDIOGRAM (TEE);  Surgeon: Pixie Casino, MD;  Location: [facility] ENDOSCOPY;  Service: Cardiovascular;  Laterality: N/A;    MEDICATIONS: . atorvastatin (LIPITOR) 10 MG tablet  . ELIQUIS 5 MG TABS tablet  . escitalopram (LEXAPRO) 10 MG tablet  . flecainide (TAMBOCOR) 150 MG tablet  . metoprolol succinate (TOPROL-XL) 50 MG 24 hr tablet  . temazepam (RESTORIL) 15 MG capsule   No current facility-administered medications for this encounter.    Derrill Memo ON 08/12/2019] vancomycin (VANCOCIN) 1,500 mg in sodium chloride 0.9 % 500 mL IVPB     Myra Gianotti, PA-C Surgical Short Stay/Anesthesiology [facility] Phone [phone] [facility] Phone [phone] 08/11/2019 12:37 PM

## 2019-08-11 NOTE — Anesthesia Preprocedure Evaluation (Addendum)
Anesthesia Evaluation  Patient identified by MRN, date of birth, ID band Patient awake    Reviewed: Allergy & Precautions, NPO status , Patient's Chart, lab work & pertinent test results  Airway Mallampati: III  TM Distance: >3 FB Neck ROM: Full    Dental  (+) Poor Dentition   Pulmonary former smoker,    breath sounds clear to auscultation       Cardiovascular  Rhythm:Regular Rate:Bradycardia     Neuro/Psych    GI/Hepatic   Endo/Other    Renal/GU      Musculoskeletal   Abdominal (+) + obese,   Peds  Hematology   Anesthesia Other Findings   Reproductive/Obstetrics                            Anesthesia Physical Anesthesia Plan  ASA: III  Anesthesia Plan: General   Post-op Pain Management:    Induction: Intravenous  PONV Risk Score and Plan: Ondansetron and Dexamethasone  Airway Management Planned: Double Lumen EBT  Additional Equipment: Arterial line and CVP  Intra-op Plan:   Post-operative Plan:   Informed Consent: I have reviewed the patients History and Physical, chart, labs and discussed the procedure including the risks, benefits and alternatives for the proposed anesthesia with the patient or authorized representative who has indicated his/her understanding and acceptance.       Plan Discussed with: CRNA and Anesthesiologist  Anesthesia Plan Comments: (PAT note written 08/11/2019 by Myra Gianotti, PA-C. )       Anesthesia Quick Evaluation

## 2019-08-12 ENCOUNTER — Inpatient Hospital Stay (HOSPITAL_COMMUNITY): Payer: 59 | Admitting: Certified Registered Nurse Anesthetist

## 2019-08-12 ENCOUNTER — Other Ambulatory Visit: Payer: Self-pay

## 2019-08-12 ENCOUNTER — Inpatient Hospital Stay (HOSPITAL_COMMUNITY)
Admission: RE | Admit: 2019-08-12 | Discharge: 2019-08-16 | DRG: 164 | Disposition: A | Discharge status: Home / Self Care | Payer: 59 | Attending: Thoracic Surgery (Cardiothoracic Vascular Surgery) | Admitting: Thoracic Surgery (Cardiothoracic Vascular Surgery)

## 2019-08-12 ENCOUNTER — Encounter (HOSPITAL_COMMUNITY)
Admission: RE | Disposition: A | Discharge status: Home / Self Care | Payer: Self-pay | Source: Home / Self Care | Attending: Thoracic Surgery (Cardiothoracic Vascular Surgery)

## 2019-08-12 ENCOUNTER — Inpatient Hospital Stay (HOSPITAL_COMMUNITY): Payer: 59

## 2019-08-12 ENCOUNTER — Encounter (HOSPITAL_COMMUNITY): Payer: Self-pay

## 2019-08-12 DIAGNOSIS — Z801 Family history of malignant neoplasm of trachea, bronchus and lung: Secondary | ICD-10-CM

## 2019-08-12 DIAGNOSIS — I480 Paroxysmal atrial fibrillation: Secondary | ICD-10-CM | POA: Diagnosis present

## 2019-08-12 DIAGNOSIS — C3411 Malignant neoplasm of upper lobe, right bronchus or lung: Secondary | ICD-10-CM | POA: Insufficient documentation

## 2019-08-12 DIAGNOSIS — E782 Mixed hyperlipidemia: Secondary | ICD-10-CM | POA: Diagnosis present

## 2019-08-12 DIAGNOSIS — E669 Obesity, unspecified: Secondary | ICD-10-CM | POA: Diagnosis present

## 2019-08-12 DIAGNOSIS — D62 Acute posthemorrhagic anemia: Secondary | ICD-10-CM | POA: Diagnosis not present

## 2019-08-12 DIAGNOSIS — Z902 Acquired absence of lung [part of]: Secondary | ICD-10-CM

## 2019-08-12 DIAGNOSIS — Z79899 Other long term (current) drug therapy: Secondary | ICD-10-CM

## 2019-08-12 DIAGNOSIS — Z880 Allergy status to penicillin: Secondary | ICD-10-CM

## 2019-08-12 DIAGNOSIS — Z7901 Long term (current) use of anticoagulants: Secondary | ICD-10-CM | POA: Diagnosis not present

## 2019-08-12 DIAGNOSIS — Z20828 Contact with and (suspected) exposure to other viral communicable diseases: Secondary | ICD-10-CM | POA: Diagnosis present

## 2019-08-12 DIAGNOSIS — Z87891 Personal history of nicotine dependence: Secondary | ICD-10-CM | POA: Diagnosis not present

## 2019-08-12 DIAGNOSIS — Z6838 Body mass index (BMI) 38.0-38.9, adult: Secondary | ICD-10-CM

## 2019-08-12 DIAGNOSIS — J449 Chronic obstructive pulmonary disease, unspecified: Secondary | ICD-10-CM | POA: Diagnosis present

## 2019-08-12 DIAGNOSIS — J939 Pneumothorax, unspecified: Secondary | ICD-10-CM

## 2019-08-12 DIAGNOSIS — C3491 Malignant neoplasm of unspecified part of right bronchus or lung: Secondary | ICD-10-CM | POA: Diagnosis present

## 2019-08-12 DIAGNOSIS — Z9689 Presence of other specified functional implants: Secondary | ICD-10-CM

## 2019-08-12 DIAGNOSIS — Z885 Allergy status to narcotic agent status: Secondary | ICD-10-CM

## 2019-08-12 DIAGNOSIS — R911 Solitary pulmonary nodule: Secondary | ICD-10-CM

## 2019-08-12 HISTORY — PX: VIDEO ASSISTED THORACOSCOPY (VATS)/ LOBECTOMY: SHX6169

## 2019-08-12 HISTORY — DX: Malignant neoplasm of upper lobe, right bronchus or lung: C34.11

## 2019-08-12 HISTORY — DX: Acquired absence of lung (part of): Z90.2

## 2019-08-12 LAB — GLUCOSE, CAPILLARY
Glucose-Capillary: 143 mg/dL — ABNORMAL HIGH (ref 70–99)
Glucose-Capillary: 124 mg/dL — ABNORMAL HIGH (ref 70–99)
Glucose-Capillary: 110 mg/dL — ABNORMAL HIGH (ref 70–99)

## 2019-08-12 LAB — PROTIME-INR
INR: 1.1 (ref 0.8–1.2)
Prothrombin Time: 13.8 seconds (ref 11.4–15.2)

## 2019-08-12 LAB — PREPARE RBC (CROSSMATCH)

## 2019-08-12 SURGERY — VIDEO ASSISTED THORACOSCOPY (VATS)/ LOBECTOMY
Anesthesia: General | Site: Chest | Laterality: Right

## 2019-08-12 MED ORDER — MIDAZOLAM HCL 2 MG/2ML IJ SOLN
1.0000 mg | Freq: Once | INTRAMUSCULAR | Status: AC
  Administered 2019-08-12: 12:00:00 1 mg via INTRAVENOUS

## 2019-08-12 MED ORDER — KETOROLAC TROMETHAMINE 0.5 % OP SOLN
1.0000 [drp] | Freq: Three times a day (TID) | OPHTHALMIC | Status: AC | PRN
  Administered 2019-08-12: 1 [drp] via OPHTHALMIC
  Filled 2019-08-12: qty 5

## 2019-08-12 MED ORDER — SODIUM CHLORIDE 0.9% IV SOLUTION: Freq: Once | INTRAVENOUS | Status: DC

## 2019-08-12 MED ORDER — KETOROLAC TROMETHAMINE 0.5 % OP SOLN
OPHTHALMIC | Status: AC
  Filled 2019-08-12: qty 5

## 2019-08-12 MED ORDER — BUPIVACAINE HCL (PF) 0.5 % IJ SOLN
INTRAMUSCULAR | Status: AC
  Filled 2019-08-12: qty 30

## 2019-08-12 MED ORDER — SUGAMMADEX SODIUM 500 MG/5ML IV SOLN
INTRAVENOUS | Status: AC
  Filled 2019-08-12: qty 5

## 2019-08-12 MED ORDER — FENTANYL CITRATE (PF) 250 MCG/5ML IJ SOLN
INTRAMUSCULAR | Status: AC
  Filled 2019-08-12: qty 5

## 2019-08-12 MED ORDER — MIDAZOLAM HCL 2 MG/2ML IJ SOLN
INTRAMUSCULAR | Status: DC | PRN
  Administered 2019-08-12: 2 mg via INTRAVENOUS

## 2019-08-12 MED ORDER — MIDAZOLAM HCL 2 MG/2ML IJ SOLN
INTRAMUSCULAR | Status: AC
  Filled 2019-08-12: qty 2

## 2019-08-12 MED ORDER — METOPROLOL SUCCINATE ER 25 MG PO TB24
25.0000 mg | ORAL_TABLET | Freq: Every day | ORAL | Status: DC
  Administered 2019-08-13 – 2019-08-16 (×4): 25 mg via ORAL
  Filled 2019-08-12 (×4): qty 1

## 2019-08-12 MED ORDER — ONDANSETRON HCL 4 MG/2ML IJ SOLN
4.0000 mg | Freq: Once | INTRAMUSCULAR | Status: AC | PRN
  Administered 2019-08-12: 4 mg via INTRAVENOUS

## 2019-08-12 MED ORDER — SODIUM CHLORIDE 0.9 % IV SOLN
INTRAVENOUS | Status: DC
  Administered 2019-08-12 – 2019-08-13 (×2): via INTRAVENOUS

## 2019-08-12 MED ORDER — ACETAMINOPHEN 500 MG PO TABS
1000.0000 mg | ORAL_TABLET | Freq: Four times a day (QID) | ORAL | Status: DC
  Administered 2019-08-12 (×2): 1000 mg via ORAL
  Administered 2019-08-13: 500 mg via ORAL
  Administered 2019-08-13 – 2019-08-16 (×11): 1000 mg via ORAL
  Filled 2019-08-12 (×14): qty 2

## 2019-08-12 MED ORDER — SUGAMMADEX SODIUM 200 MG/2ML IV SOLN
INTRAVENOUS | Status: DC | PRN
  Administered 2019-08-12: 160 mg via INTRAVENOUS

## 2019-08-12 MED ORDER — BSS IO SOLN
15.0000 mL | Freq: Once | INTRAOCULAR | Status: AC
  Administered 2019-08-12: 15 mL
  Filled 2019-08-12: qty 15

## 2019-08-12 MED ORDER — DIPHENHYDRAMINE HCL 50 MG/ML IJ SOLN: 12.5000 mg | Freq: Four times a day (QID) | INTRAMUSCULAR | Status: DC | PRN

## 2019-08-12 MED ORDER — INSULIN ASPART 100 UNIT/ML ~~LOC~~ SOLN
0.0000 [IU] | SUBCUTANEOUS | Status: DC
  Administered 2019-08-12 – 2019-08-13 (×2): 2 [IU] via SUBCUTANEOUS

## 2019-08-12 MED ORDER — ONDANSETRON HCL 4 MG/2ML IJ SOLN
INTRAMUSCULAR | Status: AC
  Filled 2019-08-12: qty 2

## 2019-08-12 MED ORDER — DEXAMETHASONE SODIUM PHOSPHATE 10 MG/ML IJ SOLN
INTRAMUSCULAR | Status: DC | PRN
  Administered 2019-08-12: 10 mg via INTRAVENOUS

## 2019-08-12 MED ORDER — BUPIVACAINE LIPOSOME 1.3 % IJ SUSP
20.0000 mL | INTRAMUSCULAR | Status: DC
  Filled 2019-08-12: qty 20

## 2019-08-12 MED ORDER — SENNOSIDES-DOCUSATE SODIUM 8.6-50 MG PO TABS
1.0000 | ORAL_TABLET | Freq: Every day | ORAL | Status: DC
  Administered 2019-08-12 – 2019-08-15 (×4): 1 via ORAL
  Filled 2019-08-12 (×4): qty 1

## 2019-08-12 MED ORDER — ENOXAPARIN SODIUM 40 MG/0.4ML ~~LOC~~ SOLN
40.0000 mg | Freq: Every day | SUBCUTANEOUS | Status: DC
  Administered 2019-08-14 – 2019-08-15 (×2): 40 mg via SUBCUTANEOUS
  Filled 2019-08-12 (×2): qty 0.4

## 2019-08-12 MED ORDER — GLYCOPYRROLATE 0.2 MG/ML IJ SOLN
INTRAMUSCULAR | Status: DC | PRN
  Administered 2019-08-12: 0.2 mg via INTRAVENOUS

## 2019-08-12 MED ORDER — DEXAMETHASONE SODIUM PHOSPHATE 10 MG/ML IJ SOLN
INTRAMUSCULAR | Status: AC
  Filled 2019-08-12: qty 1

## 2019-08-12 MED ORDER — BISACODYL 5 MG PO TBEC
10.0000 mg | DELAYED_RELEASE_TABLET | Freq: Every day | ORAL | Status: DC
  Administered 2019-08-13 – 2019-08-16 (×4): 10 mg via ORAL
  Filled 2019-08-12 (×4): qty 2

## 2019-08-12 MED ORDER — CHLORHEXIDINE GLUCONATE CLOTH 2 % EX PADS
6.0000 | MEDICATED_PAD | Freq: Every day | CUTANEOUS | Status: DC
  Administered 2019-08-13 – 2019-08-15 (×3): 6 via TOPICAL

## 2019-08-12 MED ORDER — SODIUM CHLORIDE FLUSH 0.9 % IV SOLN
INTRAVENOUS | Status: DC | PRN
  Administered 2019-08-12: 90 mL

## 2019-08-12 MED ORDER — SODIUM CHLORIDE 0.9 % IV SOLN: INTRAVENOUS | Status: DC | PRN

## 2019-08-12 MED ORDER — EPHEDRINE 5 MG/ML INJ
INTRAVENOUS | Status: AC
  Filled 2019-08-12: qty 10

## 2019-08-12 MED ORDER — ROCURONIUM BROMIDE 10 MG/ML (PF) SYRINGE
PREFILLED_SYRINGE | INTRAVENOUS | Status: DC | PRN
  Administered 2019-08-12: 30 mg via INTRAVENOUS
  Administered 2019-08-12: 20 mg via INTRAVENOUS
  Administered 2019-08-12: 60 mg via INTRAVENOUS
  Administered 2019-08-12 (×2): 30 mg via INTRAVENOUS
  Administered 2019-08-12: 40 mg via INTRAVENOUS
  Administered 2019-08-12: 30 mg via INTRAVENOUS
  Administered 2019-08-12: 20 mg via INTRAVENOUS

## 2019-08-12 MED ORDER — ONDANSETRON HCL 4 MG/2ML IJ SOLN: 4.0000 mg | Freq: Four times a day (QID) | INTRAMUSCULAR | Status: DC | PRN

## 2019-08-12 MED ORDER — ACETAMINOPHEN 160 MG/5ML PO SOLN: 1000.0000 mg | Freq: Four times a day (QID) | ORAL | Status: DC

## 2019-08-12 MED ORDER — ONDANSETRON HCL 4 MG/2ML IJ SOLN
INTRAMUSCULAR | Status: DC | PRN
  Administered 2019-08-12: 4 mg via INTRAVENOUS

## 2019-08-12 MED ORDER — ONDANSETRON HCL 4 MG/2ML IJ SOLN
4.0000 mg | Freq: Four times a day (QID) | INTRAMUSCULAR | Status: DC | PRN
  Administered 2019-08-13 – 2019-08-14 (×4): 4 mg via INTRAVENOUS
  Filled 2019-08-12 (×4): qty 2

## 2019-08-12 MED ORDER — FENTANYL CITRATE (PF) 100 MCG/2ML IJ SOLN: 25.0000 ug | INTRAMUSCULAR | Status: DC | PRN

## 2019-08-12 MED ORDER — DIPHENHYDRAMINE HCL 12.5 MG/5ML PO ELIX
12.5000 mg | ORAL_SOLUTION | Freq: Four times a day (QID) | ORAL | Status: DC | PRN
  Filled 2019-08-12: qty 5

## 2019-08-12 MED ORDER — PHENYLEPHRINE HCL-NACL 10-0.9 MG/250ML-% IV SOLN
INTRAVENOUS | Status: DC | PRN
  Administered 2019-08-12: 30 ug/min via INTRAVENOUS

## 2019-08-12 MED ORDER — ROCURONIUM BROMIDE 10 MG/ML (PF) SYRINGE
PREFILLED_SYRINGE | INTRAVENOUS | Status: AC
  Filled 2019-08-12: qty 20

## 2019-08-12 MED ORDER — TRAMADOL HCL 50 MG PO TABS
50.0000 mg | ORAL_TABLET | Freq: Four times a day (QID) | ORAL | Status: DC | PRN
  Administered 2019-08-13 – 2019-08-14 (×3): 100 mg via ORAL
  Filled 2019-08-12 (×3): qty 2

## 2019-08-12 MED ORDER — ESCITALOPRAM OXALATE 10 MG PO TABS
10.0000 mg | ORAL_TABLET | Freq: Every evening | ORAL | Status: DC
  Administered 2019-08-13 – 2019-08-15 (×3): 10 mg via ORAL
  Filled 2019-08-12 (×3): qty 1

## 2019-08-12 MED ORDER — FLECAINIDE ACETATE 50 MG PO TABS
150.0000 mg | ORAL_TABLET | Freq: Two times a day (BID) | ORAL | Status: DC
  Administered 2019-08-13 – 2019-08-16 (×7): 150 mg via ORAL
  Filled 2019-08-12 (×7): qty 1

## 2019-08-12 MED ORDER — FENTANYL 40 MCG/ML IV SOLN
INTRAVENOUS | Status: DC
  Administered 2019-08-12: 1000 ug via INTRAVENOUS
  Administered 2019-08-12: 150 ug via INTRAVENOUS
  Administered 2019-08-13: 1000 ug via INTRAVENOUS
  Administered 2019-08-13: 120 ug via INTRAVENOUS
  Administered 2019-08-14: 40 ug via INTRAVENOUS
  Administered 2019-08-14: 30 ug via INTRAVENOUS
  Administered 2019-08-14: 15 ug via INTRAVENOUS
  Administered 2019-08-15: 90 ug via INTRAVENOUS
  Administered 2019-08-15: 15 ug via INTRAVENOUS
  Administered 2019-08-15: 45 ug via INTRAVENOUS
  Filled 2019-08-12 (×2): qty 25

## 2019-08-12 MED ORDER — NALOXONE HCL 0.4 MG/ML IJ SOLN: 0.4000 mg | INTRAMUSCULAR | Status: DC | PRN

## 2019-08-12 MED ORDER — LACTATED RINGERS IV SOLN
INTRAVENOUS | Status: DC
  Administered 2019-08-12 (×2): via INTRAVENOUS

## 2019-08-12 MED ORDER — GLYCOPYRROLATE PF 0.2 MG/ML IJ SOSY
PREFILLED_SYRINGE | INTRAMUSCULAR | Status: AC
  Filled 2019-08-12: qty 1

## 2019-08-12 MED ORDER — LIDOCAINE 2% (20 MG/ML) 5 ML SYRINGE
INTRAMUSCULAR | Status: AC
  Filled 2019-08-12: qty 5

## 2019-08-12 MED ORDER — LACTATED RINGERS IV SOLN
INTRAVENOUS | Status: DC | PRN
  Administered 2019-08-12: 08:00:00 via INTRAVENOUS

## 2019-08-12 MED ORDER — ROCURONIUM BROMIDE 10 MG/ML (PF) SYRINGE
PREFILLED_SYRINGE | INTRAVENOUS | Status: AC
  Filled 2019-08-12: qty 10

## 2019-08-12 MED ORDER — VANCOMYCIN HCL IN DEXTROSE 1-5 GM/200ML-% IV SOLN
1000.0000 mg | Freq: Two times a day (BID) | INTRAVENOUS | Status: AC
  Administered 2019-08-12: 1000 mg via INTRAVENOUS
  Filled 2019-08-12: qty 200

## 2019-08-12 MED ORDER — HEMOSTATIC AGENTS (NO CHARGE) OPTIME
TOPICAL | Status: DC | PRN
  Administered 2019-08-12: 1 via TOPICAL

## 2019-08-12 MED ORDER — PROPOFOL 10 MG/ML IV BOLUS
INTRAVENOUS | Status: AC
  Filled 2019-08-12: qty 40

## 2019-08-12 MED ORDER — FENTANYL CITRATE (PF) 250 MCG/5ML IJ SOLN
INTRAMUSCULAR | Status: DC | PRN
  Administered 2019-08-12: 50 ug via INTRAVENOUS
  Administered 2019-08-12: 100 ug via INTRAVENOUS
  Administered 2019-08-12 (×2): 50 ug via INTRAVENOUS

## 2019-08-12 MED ORDER — ATORVASTATIN CALCIUM 10 MG PO TABS
10.0000 mg | ORAL_TABLET | Freq: Every evening | ORAL | Status: DC
  Administered 2019-08-13 – 2019-08-15 (×3): 10 mg via ORAL
  Filled 2019-08-12 (×3): qty 1

## 2019-08-12 MED ORDER — EPHEDRINE SULFATE-NACL 50-0.9 MG/10ML-% IV SOSY
PREFILLED_SYRINGE | INTRAVENOUS | Status: DC | PRN
  Administered 2019-08-12 (×2): 5 mg via INTRAVENOUS

## 2019-08-12 MED ORDER — PROPOFOL 10 MG/ML IV BOLUS
INTRAVENOUS | Status: DC | PRN
  Administered 2019-08-12: 150 mg via INTRAVENOUS

## 2019-08-12 MED ORDER — BSS IO SOLN
INTRAOCULAR | Status: AC
  Filled 2019-08-12: qty 15

## 2019-08-12 MED ORDER — METOPROLOL SUCCINATE ER 50 MG PO TB24
50.0000 mg | ORAL_TABLET | Freq: Every day | ORAL | Status: DC
  Administered 2019-08-12: 50 mg via ORAL
  Filled 2019-08-12: qty 1

## 2019-08-12 MED ORDER — SODIUM CHLORIDE 0.9% FLUSH: 9.0000 mL | INTRAVENOUS | Status: DC | PRN

## 2019-08-12 SURGICAL SUPPLY — 114 items
APPLICATOR COTTON TIP 6 STRL (MISCELLANEOUS) IMPLANT
APPLICATOR COTTON TIP 6IN STRL (MISCELLANEOUS) ×4
BLADE CLIPPER SURG (BLADE) ×4 IMPLANT
CANISTER SUCT 3000ML PPV (MISCELLANEOUS) ×8 IMPLANT
CATH THORACIC 28FR (CATHETERS) ×1 IMPLANT
CATH THORACIC 36FR (CATHETERS) IMPLANT
CATH THORACIC 36FR RT ANG (CATHETERS) IMPLANT
CLIP VESOCCLUDE MED 6/CT (CLIP) ×6 IMPLANT
CONN ST 1/4X3/8  BEN (MISCELLANEOUS)
CONN ST 1/4X3/8 BEN (MISCELLANEOUS) IMPLANT
CONN Y 3/8X3/8X3/8  BEN (MISCELLANEOUS)
CONN Y 3/8X3/8X3/8 BEN (MISCELLANEOUS) IMPLANT
CONT SPEC 4OZ CLIKSEAL STRL BL (MISCELLANEOUS) ×22 IMPLANT
COVER SURGICAL LIGHT HANDLE (MISCELLANEOUS) ×5 IMPLANT
COVER WAND RF STERILE (DRAPES) ×4 IMPLANT
CUTTER ECHEON FLEX ENDO 45 340 (ENDOMECHANICALS) ×1 IMPLANT
DEFOGGER ANTIFOG KIT (MISCELLANEOUS) IMPLANT
DEFOGGER SCOPE WARMER CLEARIFY (MISCELLANEOUS) IMPLANT
DERMABOND ADVANCED (GAUZE/BANDAGES/DRESSINGS) ×1
DERMABOND ADVANCED .7 DNX12 (GAUZE/BANDAGES/DRESSINGS) ×3 IMPLANT
DRAIN CHANNEL 28F RND 3/8 FF (WOUND CARE) IMPLANT
DRAIN CHANNEL 32F RND 10.7 FF (WOUND CARE) IMPLANT
DRAPE CV SPLIT W-CLR ANES SCRN (DRAPES) ×4 IMPLANT
DRAPE INCISE IOBAN 66X45 STRL (DRAPES) ×1 IMPLANT
DRAPE ORTHO SPLIT 77X108 STRL (DRAPES) ×1
DRAPE SURG ORHT 6 SPLT 77X108 (DRAPES) ×3 IMPLANT
DRAPE WARM FLUID 44X44 (DRAPES) ×4 IMPLANT
ELECT BLADE 6.5 EXT (BLADE) ×5 IMPLANT
ELECT REM PT RETURN 9FT ADLT (ELECTROSURGICAL) ×4
ELECTRODE REM PT RTRN 9FT ADLT (ELECTROSURGICAL) ×3 IMPLANT
FELT TEFLON 1X6 (MISCELLANEOUS) IMPLANT
GAUZE SPONGE 4X4 12PLY STRL (GAUZE/BANDAGES/DRESSINGS) IMPLANT
GAUZE SPONGE 4X4 12PLY STRL LF (GAUZE/BANDAGES/DRESSINGS) ×1 IMPLANT
GLOVE BIO SURGEON STRL SZ 6.5 (GLOVE) ×1 IMPLANT
GLOVE BIOGEL PI IND STRL 6.5 (GLOVE) IMPLANT
GLOVE BIOGEL PI INDICATOR 6.5 (GLOVE) ×2
GLOVE SURG SIGNA 7.5 PF LTX (GLOVE) ×9 IMPLANT
GLOVE SURG SS PI 6.0 STRL IVOR (GLOVE) ×3 IMPLANT
GLOVE SURG SS PI 6.5 STRL IVOR (GLOVE) ×1 IMPLANT
GOWN STRL REUS W/ TWL LRG LVL3 (GOWN DISPOSABLE) ×6 IMPLANT
GOWN STRL REUS W/ TWL XL LVL3 (GOWN DISPOSABLE) ×6 IMPLANT
GOWN STRL REUS W/TWL LRG LVL3 (GOWN DISPOSABLE) ×2
GOWN STRL REUS W/TWL XL LVL3 (GOWN DISPOSABLE) ×2
HEMOSTAT SURGICEL 2X14 (HEMOSTASIS) IMPLANT
KIT BASIN OR (CUSTOM PROCEDURE TRAY) ×4 IMPLANT
KIT SUCTION CATH 14FR (SUCTIONS) ×5 IMPLANT
KIT TURNOVER KIT B (KITS) ×4 IMPLANT
NDL HYPO 25GX1X1/2 BEV (NEEDLE) IMPLANT
NDL SPNL 18GX3.5 QUINCKE PK (NEEDLE) IMPLANT
NEEDLE HYPO 25GX1X1/2 BEV (NEEDLE) ×4 IMPLANT
NEEDLE SPNL 18GX3.5 QUINCKE PK (NEEDLE) IMPLANT
NS IRRIG 1000ML POUR BTL (IV SOLUTION) ×12 IMPLANT
PACK CHEST (CUSTOM PROCEDURE TRAY) ×4 IMPLANT
PAD ARMBOARD 7.5X6 YLW CONV (MISCELLANEOUS) ×8 IMPLANT
POUCH ENDO CATCH II 15MM (MISCELLANEOUS) ×1 IMPLANT
POUCH SPECIMEN RETRIEVAL 10MM (ENDOMECHANICALS) ×1 IMPLANT
RELOAD STAPLE 35X2.5 WHT THIN (STAPLE) IMPLANT
RELOAD STAPLE 45 3.5 BLU ETS (ENDOMECHANICALS) IMPLANT
RELOAD STAPLE 45 4.1 GRN THCK (STAPLE) IMPLANT
RELOAD STAPLE 45 GOLD REG/THCK (STAPLE) IMPLANT
RELOAD STAPLE TA45 3.5 REG BLU (ENDOMECHANICALS) ×4 IMPLANT
SCISSORS LAP 5X35 DISP (ENDOMECHANICALS) IMPLANT
SEALANT PROGEL (MISCELLANEOUS) IMPLANT
SEALANT SURG COSEAL 4ML (VASCULAR PRODUCTS) IMPLANT
SEALANT SURG COSEAL 8ML (VASCULAR PRODUCTS) IMPLANT
SHEARS HARMONIC ACE PLUS 36CM (ENDOMECHANICALS) ×1 IMPLANT
SHEARS HARMONIC HDI 20CM (ELECTROSURGICAL) ×1 IMPLANT
SOL ANTI FOG 6CC (MISCELLANEOUS) ×3 IMPLANT
SOLUTION ANTI FOG 6CC (MISCELLANEOUS) ×1
SPECIMEN JAR MEDIUM (MISCELLANEOUS) ×4 IMPLANT
SPONGE INTESTINAL PEANUT (DISPOSABLE) ×7 IMPLANT
SPONGE TONSIL TAPE 1 RFD (DISPOSABLE) ×5 IMPLANT
STAPLE RELOAD 2.5MM WHITE (STAPLE) ×24 IMPLANT
STAPLE RELOAD 45 GRN (STAPLE) ×12 IMPLANT
STAPLE RELOAD 45MM GOLD (STAPLE) ×56 IMPLANT
STAPLE RELOAD 45MM GREEN (STAPLE) ×4
STAPLER ENDO NO KNIFE (STAPLE) ×1 IMPLANT
STAPLER VASCULAR ECHELON 35 (CUTTER) ×1 IMPLANT
STOPCOCK 4 WAY LG BORE MALE ST (IV SETS) ×5 IMPLANT
SUT PROLENE 4 0 RB 1 (SUTURE) ×1
SUT PROLENE 4-0 RB1 .5 CRCL 36 (SUTURE) IMPLANT
SUT SILK  1 MH (SUTURE) ×3
SUT SILK 1 MH (SUTURE) ×6 IMPLANT
SUT SILK 1 TIES 10X30 (SUTURE) ×4 IMPLANT
SUT SILK 2 0 SH (SUTURE) IMPLANT
SUT SILK 2 0SH CR/8 30 (SUTURE) IMPLANT
SUT SILK 3 0 SH 30 (SUTURE) IMPLANT
SUT SILK 3 0SH CR/8 30 (SUTURE) ×4 IMPLANT
SUT VIC AB 0 CTX 27 (SUTURE) IMPLANT
SUT VIC AB 1 CTX 27 (SUTURE) ×5 IMPLANT
SUT VIC AB 2-0 CT1 27 (SUTURE)
SUT VIC AB 2-0 CT1 TAPERPNT 27 (SUTURE) IMPLANT
SUT VIC AB 2-0 CTX 36 (SUTURE) ×9 IMPLANT
SUT VIC AB 3-0 MH 27 (SUTURE) IMPLANT
SUT VIC AB 3-0 SH 27 (SUTURE)
SUT VIC AB 3-0 SH 27X BRD (SUTURE) IMPLANT
SUT VIC AB 3-0 X1 27 (SUTURE) ×6 IMPLANT
SUT VICRYL 0 UR6 27IN ABS (SUTURE) ×1 IMPLANT
SUT VICRYL 2 TP 1 (SUTURE) IMPLANT
SYR 10ML LL (SYRINGE) ×4 IMPLANT
SYR 20ML LL LF (SYRINGE) ×4 IMPLANT
SYR 30ML LL (SYRINGE) ×5 IMPLANT
SYR 50ML LL SCALE MARK (SYRINGE) ×4 IMPLANT
SYSTEM SAHARA CHEST DRAIN ATS (WOUND CARE) ×4 IMPLANT
TAPE CLOTH SURG 4X10 WHT LF (GAUZE/BANDAGES/DRESSINGS) ×1 IMPLANT
TIP APPLICATOR SPRAY EXTEND 16 (VASCULAR PRODUCTS) IMPLANT
TOWEL GREEN STERILE (TOWEL DISPOSABLE) ×4 IMPLANT
TOWEL GREEN STERILE FF (TOWEL DISPOSABLE) ×4 IMPLANT
TRAY FOLEY MTR SLVR 16FR STAT (SET/KITS/TRAYS/PACK) ×4 IMPLANT
TROCAR BLADELESS 5M (ENDOMECHANICALS) ×1 IMPLANT
TROCAR XCEL BLADELESS 5X75MML (TROCAR) ×4 IMPLANT
TROCAR XCEL NON-BLD 5MMX100MML (ENDOMECHANICALS) IMPLANT
TUBING EXTENTION W/L.L. (IV SETS) ×4 IMPLANT
WATER STERILE IRR 1000ML POUR (IV SOLUTION) ×8 IMPLANT

## 2019-08-12 NOTE — Anesthesia Procedure Notes (Signed)
Procedure Name: Intubation Date/Time: 08/12/2019 7:53 AM Performed by: Griffin Dakin, CRNA Pre-anesthesia Checklist: Patient identified, Emergency Drugs available, Suction available and Patient being monitored Patient Re-evaluated:Patient Re-evaluated prior to induction Oxygen Delivery Method: Circle system utilized Preoxygenation: Pre-oxygenation with 100% oxygen Induction Type: IV induction Ventilation: Oral airway inserted - appropriate to patient size and Two handed mask ventilation required Laryngoscope Size: Glidescope and 3 Grade View: Grade I Tube type: Oral Endobronchial tube: Left and Double lumen EBT and 39 Fr Number of attempts: 2 Airway Equipment and Method: Stylet,  Video-laryngoscopy,  Oral airway and Bougie stylet Placement Confirmation: ETT inserted through vocal cords under direct vision,  positive ETCO2 and breath sounds checked- equal and bilateral Secured at: 37 cm Tube secured with: Tape Dental Injury: Teeth and Oropharynx as per pre-operative assessment  Comments: Unsuccessful attempt with MAC 3, successful intubation with glidescope 3 with bougie.

## 2019-08-12 NOTE — Anesthesia Postprocedure Evaluation (Signed)
Anesthesia Post Note  Patient: Adult nurse  Procedure(s) Performed: RIGHT Video Assisted Thoracoscopy (Vats) With Right Upper Lobectomy (Chest)     Patient location during evaluation: PACU Anesthesia Type: General Level of consciousness: awake and alert Pain management: pain level controlled Vital Signs Assessment: post-procedure vital signs reviewed and stable Respiratory status: spontaneous breathing, nonlabored ventilation, respiratory function stable and patient connected to nasal cannula oxygen Cardiovascular status: blood pressure returned to baseline and stable Postop Assessment: no apparent nausea or vomiting Anesthetic complications: no    Last Vitals:  Vitals:   08/12/19 1700 08/12/19 1800  BP: 109/73 111/64  Pulse: (!) 56 (!) 56  Resp: 18 16  Temp:    SpO2:      Last Pain:  Vitals:   08/12/19 1552  PainSc: 2                  Reedy Biernat COKER

## 2019-08-12 NOTE — H&P (Signed)
PCP is Lowella Dandy, NP Referring Provider is Tanda Rockers, MD      Chief Complaint  Patient presents with  . Lung Lesion    RULobe...eval for BX/SURGERY...CT CHEST 05/19/19, PET 07/01/19, PFT 06/29/19    HPI: [name] is sent for consultation regarding a right upper lobe lung nodule.  Aaron [name] is a 58 year old male with a history of tobacco abuse, obesity, atrial fibrillation, COPD, and mixed dyslipidemia.  He smoked about a pack and a half a day.  He started at age 47.  He quit 5 years ago at age 50.  He said multiple family members who died of lung cancer.  He had a sister die last year and then another sister died in 2022/10/11.  He went to his primary and asked for a low-dose screening CT.  It showed an 8 mm right apical nodule.  On follow-up CT he had an increased in size to 1.5 cm.  PET/CT showed the nodule was markedly hypermetabolic with an SUV of 11.  His appetite is good.  He denies weight loss.  He does get short of breath with exertion which is felt to be due to his atrial fibrillation.  He recently had his dose of flecainide readjusted.  He was supposed to have cardioversion today, but had missed a dose of his Eliquis and that procedure was canceled.  He can walk up a flight of stairs but it takes a minute to catch his breath after he does so.  He is not having any chest pain, pressure, or tightness. Zubrod Score: At the time of surgery this patient's most appropriate activity status/level should be described as: [] ?    0    Normal activity, no symptoms [x] ?    1    Restricted in physical strenuous activity but ambulatory, able to do out light work [] ?    2    Ambulatory and capable of self care, unable to do work activities, up and about >50 % of waking hours                              [] ?    3    Only limited self care, in bed greater than 50% of waking hours [] ?    4    Completely disabled, no self care, confined to bed or chair [] ?    5    Moribund       Patient  Active Problem List   Diagnosis Date Noted  . Tobacco abuse 07/07/2019  . Mixed dyslipidemia 03/17/2019  . Insomnia 02/16/2019  . Atrial fibrillation ([place]) 02/16/2019  . COPD  GOLD 0 = CT criteria only  01/26/2019  . Solitary pulmonary nodule on lung CT 01/26/2019          Past Surgical History:  Procedure Laterality Date  . BACK SURGERY    . CHOLECYSTECTOMY           Family History  Problem Relation Age of Onset  . Lung cancer Mother        smoked  . Lung cancer Father        smoked  . Lung cancer Sister        smoked  . Lung cancer Brother        smoked    Social History Social History        Tobacco Use  . Smoking status: Former Smoker    Packs/day:  1.50    Years: 40.00    Pack years: 60.00    Types: Cigarettes    Quit date: 09/22/2013    Years since quitting: 5.7  . Smokeless tobacco: Never Used  Substance Use Topics  . Alcohol use: Not on file  . Drug use: Not on file          Current Outpatient Medications  Medication Sig Dispense Refill  . atorvastatin (LIPITOR) 10 MG tablet Take 10 mg by mouth daily.    Marland Kitchen ELIQUIS 5 MG TABS tablet Take 5 mg by mouth 2 (two) times daily.    Marland Kitchen escitalopram (LEXAPRO) 10 MG tablet Take 1 tablet by mouth daily.    . flecainide (TAMBOCOR) 150 MG tablet Take 1 tablet (150 mg total) by mouth 2 (two) times daily. 90 tablet 3  . metoprolol succinate (TOPROL-XL) 50 MG 24 hr tablet TAKE 1/2 TABLET BY MOUTH EVERY MORNING and TAKE ONE TABLET BY MOUTH at night with or immediately following a meal 30 tablet 4  . temazepam (RESTORIL) 15 MG capsule Take 1 capsule by mouth daily.     No current facility-administered medications for this visit.         Allergies  Allergen Reactions  . Codeine   . Penicillins     Review of Systems  Constitutional: Positive for fatigue. Negative for activity change and unexpected weight change.  HENT: Positive for dental problem. Negative for  trouble swallowing and voice change.   Respiratory: Positive for shortness of breath. Negative for cough and wheezing.   Cardiovascular: Positive for palpitations and leg swelling (Mild after being on his feet all day). Negative for chest pain.  Gastrointestinal: Negative for abdominal distention and abdominal pain.  Genitourinary: Negative for difficulty urinating and dysuria.  Musculoskeletal: Negative for arthralgias and myalgias.  Neurological: Negative for dizziness, seizures and syncope.  Hematological: Negative for adenopathy. Does not bruise/bleed easily.  Psychiatric/Behavioral: Positive for dysphoric mood. The patient is nervous/anxious.     BP 100/64 (BP Location: Right Arm, Patient Position: Sitting, Cuff Size: Large) Comment: 100/64  Pulse 92 Comment: IRREG  Temp (!) 97.5 F (36.4 C)   Resp 16   Ht 6\' 1"  (1.854 m)   Wt 285 lb (129.3 kg)   SpO2 95% Comment: ON RA  BMI 37.60 kg/m  Physical Exam Vitals signs reviewed.  Constitutional:      General: He is not in acute distress.    Appearance: He is obese.  HENT:     Head: Normocephalic and atraumatic.  Eyes:     General: No scleral icterus.    Extraocular Movements: Extraocular movements intact.  Neck:     Musculoskeletal: Neck supple. No neck rigidity.  Cardiovascular:     Rate and Rhythm: Normal rate. Rhythm irregular.     Heart sounds: No murmur.  Pulmonary:     Effort: Pulmonary effort is normal. No respiratory distress.     Breath sounds: No wheezing or rales.  Abdominal:     General: There is no distension.     Palpations: Abdomen is soft.     Tenderness: There is no abdominal tenderness.  Musculoskeletal:        General: No swelling.  Lymphadenopathy:     Cervical: No cervical adenopathy.  Skin:    General: Skin is warm and dry.  Neurological:     General: No focal deficit present.     Mental Status: He is alert and oriented to person, place, and time.  Cranial Nerves: No cranial nerve deficit.      Motor: No weakness.     Gait: Gait normal.    Diagnostic Tests: NUCLEAR MEDICINE PET SKULL BASE TO THIGH  TECHNIQUE: 14.3 mCi F-18 FDG was injected intravenously. Full-ring PET imaging was performed from the skull base to thigh after the radiotracer. CT data was obtained and used for attenuation correction and anatomic localization.  Fasting blood glucose: 78 mg/dl  COMPARISON: CT chest 05/19/2019  FINDINGS: Mediastinal blood pool activity: SUV max 2.6  Liver activity: SUV max NA  NECK: Physiologic activity along the palatine and lingual tonsillar tissues, relatively symmetric. No hypermetabolic or pathologically enlarged adenopathy in the neck.  Incidental CT findings: none  CHEST: The 2.2 by 1.1 cm right upper lobe pulmonary nodule has maximum SUV of 11.2, highly suspicious for malignancy. No hypermetabolic or pathologic adenopathy identified.  Incidental CT findings: Stable emphysema. Left anterior descending coronary artery atherosclerosis and right coronary artery atherosclerosis. Mild cardiomegaly.  ABDOMEN/PELVIS: No significant abnormal hypermetabolic activity in this region.  Incidental CT findings: Cholecystectomy. Aortoiliac atherosclerotic vascular disease.  SKELETON: No significant abnormal hypermetabolic activity in this region.  Incidental CT findings: none  IMPRESSION: 1. The right upper lobe nodules highly hypermetabolic with maximum SUV of 11.2, most compatible with malignancy. No adenopathy or metastatic spread identified. 2. Aortic Atherosclerosis (ICD10-I70.0) and Emphysema (ICD10-J43.9). Coronary atherosclerosis.   Electronically Signed By: Van Clines M.D. On: 06/29/2019 14:58 I personally reviewed the PET/CT images and concur with the findings noted above  Pulmonary function testing FVC 3.81 (74%) FEV1 2.95 (76%) FEV1 3.6 (81%) postbronchodilator RV 3.31 (140%) DLCO 22.70  (77%)  Impression: Aaron [name] is a 58 year old man with a history of tobacco abuse, COPD, atrial fibrillation, obesity, hyperlipidemia, and a strong family history of lung cancer.  He was found to have a right apical nodule on a low-dose screening CT in February 2020.  On follow-up the nodule had increased in size.  On PET CT it is markedly hypermetabolic with an SUV of greater than 11.  In all likelihood, this nodule is a new primary bronchogenic carcinoma.  It has to be considered that unless it can be proven otherwise.  Infectious and inflammatory nodules are also in the differential diagnosis.  The nodule still relatively small in size, is very peripheral, and is directly adjacent to a bleb.  This is not favorable for bronchoscopic or CT-guided biopsy.  Given the high index of suspicion, surgical resection for an excisional biopsy is the best option for diagnosis.  I discussed the proposed surgical procedure of right VATS for wedge resection and possible right upper lobectomy with Mr. and Mrs. Gorum.  I informed them of the general nature of the procedure including the need for general anesthesia, the incisions to be used, the intraoperative decision making, the use of drains to postoperatively, the expected hospital stay, and the overall recovery.  I informed them of the indications, risks, benefits, and alternatives.  They understand the risks include, but are not limited to death, MI, DVT, PE, stroke, bleeding, possible need for transfusion, possible need for conversion to thoracotomy, infection, prolonged air leak, respiratory or renal failure, as well as the possibility of other unforeseeable complications.  He accepts the risks of surgery and wishes to proceed as quickly as possible.  He was scheduled to have a cardioversion for his atrial fibrillation today.  That had to be canceled because he had missed a dose of Eliquis over the weekend.  I think his atrial  fibrillation is playing a  significant role in his dyspnea.  He does have some COPD and is overweight but is pulmonary function testing shows adequate function.  I will contact Dr. Geraldo Pitter and see what his plan is in terms of cardioversion for atrial fibrillation.  If that can be done and still do a surgery within a few weeks, I would prefer that be done first.  If you would delay surgery significantly then I think we should go ahead and proceed with VATS.  He knows he will have to hold his Eliquis for 48 hours prior to surgery.  Plan: We will check with Dr. Geraldo Pitter regarding cardioversion Right VATS for wedge resection and possible right upper lobectomy.  We will schedule once cardiac issues addressed.  Melrose Nakayama, MD [facility] [phone]        Electronically signed by Melrose Nakayama, MD at 07/07/2019 5:16 PM  In sinus brady rhythm this morning  Remo Lipps C. Roxan Hockey, MD [facility] [phone]

## 2019-08-12 NOTE — Anesthesia Procedure Notes (Signed)
Arterial Line Insertion Start/End11/20/2020 7:00 AM, 08/12/2019 7:15 AM Performed by: Roberts Gaudy, MD, Griffin Dakin, CRNA, CRNA  Patient location: Pre-op. Preanesthetic checklist: patient identified, IV checked, site marked, risks and benefits discussed, surgical consent, monitors and equipment checked, pre-op evaluation, timeout performed and anesthesia consent Lidocaine 1% used for infiltration Right, radial was placed Catheter size: 20 Fr Hand hygiene performed  and maximum sterile barriers used   Attempts: 2 Procedure performed without using ultrasound guided technique. Following insertion, dressing applied and Biopatch. Post procedure assessment: normal and unchanged  Patient tolerated the procedure well with no immediate complications.

## 2019-08-12 NOTE — Anesthesia Procedure Notes (Signed)
Central Venous Catheter Insertion Performed by: Roberts Gaudy, MD, anesthesiologist Start/End11/20/2020 6:50 AM, 08/12/2019 7:00 AM Patient location: Pre-op. Preanesthetic checklist: patient identified, IV checked, site marked, risks and benefits discussed, surgical consent, monitors and equipment checked, pre-op evaluation, timeout performed and anesthesia consent Lidocaine 1% used for infiltration and patient sedated Hand hygiene performed  and maximum sterile barriers used  Catheter size: 8 Fr Total catheter length 16. Central line was placed.Double lumen Procedure performed using ultrasound guided technique. Ultrasound Notes:image(s) printed for medical record Attempts: 1 Following insertion, dressing applied and line sutured. Post procedure assessment: blood return through all ports  Patient tolerated the procedure well with no immediate complications.

## 2019-08-12 NOTE — Plan of Care (Signed)
Initiated Care Plan Problem: Education: Goal: Knowledge of General Education information will improve Description: Including pain rating scale, medication(s)/side effects and non-pharmacologic comfort measures Outcome: Progressing   Problem: Health Behavior/Discharge Planning: Goal: Ability to manage health-related needs will improve Outcome: Progressing   Problem: Clinical Measurements: Goal: Ability to maintain clinical measurements within normal limits will improve Outcome: Progressing Goal: Will remain free from infection Outcome: Progressing Goal: Diagnostic test results will improve Outcome: Progressing Goal: Respiratory complications will improve Outcome: Progressing Goal: Cardiovascular complication will be avoided Outcome: Progressing   Problem: Activity: Goal: Risk for activity intolerance will decrease Outcome: Progressing   Problem: Nutrition: Goal: Adequate nutrition will be maintained Outcome: Progressing   Problem: Coping: Goal: Level of anxiety will decrease Outcome: Progressing   Problem: Elimination: Goal: Will not experience complications related to bowel motility Outcome: Progressing Goal: Will not experience complications related to urinary retention Outcome: Progressing   Problem: Pain Managment: Goal: General experience of comfort will improve Outcome: Progressing   Problem: Safety: Goal: Ability to remain free from injury will improve Outcome: Progressing   Problem: Skin Integrity: Goal: Risk for impaired skin integrity will decrease Outcome: Progressing   Problem: Education: Goal: Required Educational Video(s) Outcome: Progressing   Problem: Clinical Measurements: Goal: Postoperative complications will be avoided or minimized Outcome: Progressing   Problem: Skin Integrity: Goal: Demonstration of wound healing without infection will improve Outcome: Progressing

## 2019-08-12 NOTE — Brief Op Note (Addendum)
08/12/2019  11:53 AM  PATIENT:  Yardley Negro  58 y.o. 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   Delay start of Pharmacological VTE agent (>24hrs) due to surgical blood loss or risk of bleeding: no

## 2019-08-12 NOTE — Progress Notes (Signed)
Anesthesiology Note:  58 year old male S/P R. VATS with upper lobectomy today. In PACU he was complaining of left eye scratchy sensation.  Exam R. Eye conjunctival erythema, vision intact.  Impression: probable mild intra-op corneal abrasion.  Plan: 1. ketorolac ldrops L. eye Q8 2. Will follow up in AM, if symptoms persist, consider opthamology consult.  Aaron [name]

## 2019-08-12 NOTE — Interval H&P Note (Signed)
History and Physical Interval Note:  08/12/2019 7:18 AM  Aaron [name]  has presented today for surgery, with the diagnosis of RIGHT LUNG NODULE.  The various methods of treatment have been discussed with the patient and family. After consideration of risks, benefits and other options for treatment, the patient has consented to  Procedure(s): VIDEO ASSISTED THORACOSCOPY (VATS)/WEDGE RESECTION (Right) POSSIBLE UPPER LOBECTOMY (Right) as a surgical intervention.  The patient's history has been reviewed, patient examined, no change in status, stable for surgery.  I have reviewed the patient's chart and labs.  Questions were answered to the patient's satisfaction.     Melrose Nakayama

## 2019-08-12 NOTE — Transfer of Care (Signed)
Immediate Anesthesia Transfer of Care Note  Patient: Aaron [name]  Procedure(s) Performed: RIGHT Video Assisted Thoracoscopy (Vats) With Right Upper Lobectomy (Chest)  Patient Location: PACU  Anesthesia Type:General  Level of Consciousness: awake, alert  and oriented  Airway & Oxygen Therapy: Patient Spontanous Breathing and Patient connected to face mask oxygen  Post-op Assessment: Report given to RN and Post -op Vital signs reviewed and stable  Post vital signs: Reviewed and stable  Last Vitals:  Vitals Value Taken Time  BP 132/70 08/12/19 1204  Temp    Pulse 51 08/12/19 1210  Resp 17 08/12/19 1210  SpO2 97 % 08/12/19 1210  Vitals shown include unvalidated device data.  Last Pain:  Vitals:   08/12/19 0623  PainSc: 0-No pain         Complications: No apparent anesthesia complications

## 2019-08-12 NOTE — Plan of Care (Signed)

## 2019-08-13 ENCOUNTER — Encounter (HOSPITAL_COMMUNITY): Payer: Self-pay | Admitting: Thoracic Surgery (Cardiothoracic Vascular Surgery)

## 2019-08-13 ENCOUNTER — Inpatient Hospital Stay (HOSPITAL_COMMUNITY): Payer: 59

## 2019-08-13 LAB — CBC
HCT: 38.6 % — ABNORMAL LOW (ref 39.0–52.0)
Hemoglobin: 12.4 g/dL — ABNORMAL LOW (ref 13.0–17.0)
MCH: 30.7 pg (ref 26.0–34.0)
MCHC: 32.1 g/dL (ref 30.0–36.0)
MCV: 95.5 fL (ref 80.0–100.0)
Platelets: 200 10*3/uL (ref 150–400)
RBC: 4.04 MIL/uL — ABNORMAL LOW (ref 4.22–5.81)
RDW: 12.5 % (ref 11.5–15.5)
WBC: 18.9 10*3/uL — ABNORMAL HIGH (ref 4.0–10.5)
nRBC: 0 % (ref 0.0–0.2)

## 2019-08-13 LAB — BLOOD GAS, ARTERIAL
Acid-Base Excess: 2.4 mmol/L — ABNORMAL HIGH (ref 0.0–2.0)
Bicarbonate: 26.8 mmol/L (ref 20.0–28.0)
Drawn by: 34719
FIO2: 32
O2 Saturation: 96.1 %
Patient temperature: 37
pCO2 arterial: 44.1 mmHg (ref 32.0–48.0)
pH, Arterial: 7.4 (ref 7.350–7.450)
pO2, Arterial: 82 mmHg — ABNORMAL LOW (ref 83.0–108.0)

## 2019-08-13 LAB — GLUCOSE, CAPILLARY
Glucose-Capillary: 101 mg/dL — ABNORMAL HIGH (ref 70–99)
Glucose-Capillary: 112 mg/dL — ABNORMAL HIGH (ref 70–99)
Glucose-Capillary: 125 mg/dL — ABNORMAL HIGH (ref 70–99)
Glucose-Capillary: 97 mg/dL (ref 70–99)

## 2019-08-13 LAB — BASIC METABOLIC PANEL
Anion gap: 8 (ref 5–15)
BUN: 15 mg/dL (ref 6–20)
CO2: 24 mmol/L (ref 22–32)
Calcium: 7.8 mg/dL — ABNORMAL LOW (ref 8.9–10.3)
Chloride: 102 mmol/L (ref 98–111)
Creatinine, Ser: 1 mg/dL (ref 0.61–1.24)
GFR calc Af Amer: >60 mL/min (ref >60)
GFR calc non Af Amer: >60 mL/min (ref >60)
Glucose, Bld: 120 mg/dL — ABNORMAL HIGH (ref 70–99)
Potassium: 4 mmol/L (ref 3.5–5.1)
Sodium: 134 mmol/L — ABNORMAL LOW (ref 135–145)

## 2019-08-13 MED ORDER — LEVALBUTEROL HCL 0.63 MG/3ML IN NEBU
0.6300 mg | INHALATION_SOLUTION | Freq: Once | RESPIRATORY_TRACT | Status: AC
  Administered 2019-08-13: 10:00:00 0.63 mg via RESPIRATORY_TRACT
  Filled 2019-08-13: qty 3

## 2019-08-13 MED ORDER — LEVALBUTEROL HCL 0.63 MG/3ML IN NEBU
0.6300 mg | INHALATION_SOLUTION | Freq: Three times a day (TID) | RESPIRATORY_TRACT | Status: DC
  Administered 2019-08-13: 13:00:00 0.63 mg via RESPIRATORY_TRACT
  Filled 2019-08-13 (×2): qty 3

## 2019-08-13 MED ORDER — OXYCODONE HCL 5 MG PO TABS
5.0000 mg | ORAL_TABLET | ORAL | Status: DC | PRN
  Administered 2019-08-13 – 2019-08-14 (×2): 10 mg via ORAL
  Filled 2019-08-13 (×2): qty 2

## 2019-08-13 MED ORDER — LEVALBUTEROL HCL 0.63 MG/3ML IN NEBU
0.6300 mg | INHALATION_SOLUTION | Freq: Three times a day (TID) | RESPIRATORY_TRACT | Status: DC | PRN
  Administered 2019-08-13 – 2019-08-14 (×2): 0.63 mg via RESPIRATORY_TRACT
  Filled 2019-08-13: qty 3

## 2019-08-13 MED ORDER — KETOROLAC TROMETHAMINE 15 MG/ML IJ SOLN
15.0000 mg | Freq: Four times a day (QID) | INTRAMUSCULAR | Status: DC
  Administered 2019-08-13: 15 mg via INTRAVENOUS
  Filled 2019-08-13: qty 1

## 2019-08-13 MED ORDER — LEVALBUTEROL HCL 0.63 MG/3ML IN NEBU: 0.6300 mg | INHALATION_SOLUTION | Freq: Three times a day (TID) | RESPIRATORY_TRACT | Status: DC | PRN

## 2019-08-13 MED ORDER — LEVALBUTEROL HCL 0.63 MG/3ML IN NEBU
0.6300 mg | INHALATION_SOLUTION | Freq: Four times a day (QID) | RESPIRATORY_TRACT | Status: DC
  Administered 2019-08-13 – 2019-08-15 (×7): 0.63 mg via RESPIRATORY_TRACT
  Filled 2019-08-13 (×7): qty 3

## 2019-08-13 MED ORDER — GUAIFENESIN ER 600 MG PO TB12
600.0000 mg | ORAL_TABLET | Freq: Two times a day (BID) | ORAL | Status: DC
  Administered 2019-08-13 – 2019-08-16 (×7): 600 mg via ORAL
  Filled 2019-08-13 (×7): qty 1

## 2019-08-13 NOTE — Progress Notes (Signed)
Paged Josie Saunders PA, pt c/o wheeziness, having difficulty catching his breath, increased oxygen to 4L/min via nasal cannula and new orders rec'd for neb tx.

## 2019-08-13 NOTE — Progress Notes (Signed)
Pt seemed to improved on wheeziness after breathing treatment now c/o pain on right flank side. Paged Josie Saunders PA for orders for pain medication and medication to help with coughing phlegm. New orders rec'd for toradol, oxy ir and mucinex.

## 2019-08-13 NOTE — Progress Notes (Addendum)
      [address]       [place] [address]             [phone]       1 Day Post-Op Procedure(s): RIGHT Video Assisted Thoracoscopy (Vats) With Right Upper Lobectomy  Subjective: Patient has incisional pain.  Objective: Vital signs in last 24 hours: Temp:  [97 F (36.1 C)-98.5 F (36.9 C)] 98.5 F (36.9 C) (11/21 0744) Pulse Rate:  [49-61] 53 (11/21 0852) Cardiac Rhythm: Sinus bradycardia (11/20 2024) Resp:  [15-23] 20 (11/21 0840) BP: (96-132)/(58-79) 118/67 (11/21 0852) SpO2:  [93 %-100 %] 98 % (11/21 0840) Arterial Line BP: (98-120)/(55-74) 114/68 (11/20 2100) Weight:  [128.2 kg] 128.2 kg (11/20 1850)      Intake/Output from previous day: 11/20 0701 - 11/21 0700 In: 3487.7 [P.O.:120; I.V.:3367.7] Out: 1700 [Urine:980; Blood:500; Chest Tube:220]   Physical Exam:  Cardiovascular: SB. Pulmonary: Clear to auscultation bilaterally and slightly diminished right apex Abdomen: Soft, non tender, bowel sounds present. Extremities: Trace bilateral lower extremity edema. Wounds: Clean and dry.  No erythema or signs of infection. Chest Tube:to suction, +1 air leak with cough  Lab Results: CBC: Recent Labs    08/10/19 1319 08/13/19 0519  WBC 9.2 18.9*  HGB 14.8 12.4*  HCT 46.6 38.6*  PLT 260 200   BMET:  Recent Labs    08/10/19 1319 08/13/19 0519  NA 137 134*  K 4.2 4.0  CL 104 102  CO2 23 24  GLUCOSE 85 120*  BUN 11 15  CREATININE 0.88 1.00  CALCIUM 8.8* 7.8*    PT/INR:  Recent Labs    08/12/19 0630  LABPROT 13.8  INR 1.1   ABG:  INR: Will add last result for INR, ABG once components are confirmed Will add last 4 CBG results once components are confirmed  Assessment/Plan:  1. CV - History of a fib. SB with HR in the 50's this am. On Toprol XL 25 mg daily and 50 mg nightly and Flecainaide 150 mg bid. Will stop Toprol XL at night for now. 2.  Pulmonary - History of COPD. ABG results noted. Chest tubes with 220 cc of  output last 24 hours. Chest tubes are to suction and there is a +1 air leak with cough. On 2 liters of oxygen via [place]. CXR this am shows small right apical pneumothorax . Encourage incentive spirometer. Await final pathology 3. Expected ABL anemia-H and H this am 12.4 and 38.6 4. CBGs 112/125/101. No previous history of diabetes so will stop accu checks and SS PRN  Sharalyn Ink ZimmermanPA-C 08/13/2019,9:05 AM [phone]  Notes he was wheezing all night Leave chest tubes Work on pulmonary toilet I have seen and examined [employer] and agree with the above assessment  and plan.  Grace Isaac MD Beeper [phone] Office [phone] 08/13/2019 11:48 AM

## 2019-08-13 NOTE — Plan of Care (Signed)

## 2019-08-13 NOTE — Progress Notes (Signed)
Wasted 75mL Fentanyl with Neldon [place], Therapist, sports. New Fentanyl syringe started at 2206.

## 2019-08-14 ENCOUNTER — Inpatient Hospital Stay (HOSPITAL_COMMUNITY): Payer: 59

## 2019-08-14 LAB — COMPREHENSIVE METABOLIC PANEL
ALT: 20 U/L (ref 0–44)
AST: 24 U/L (ref 15–41)
Albumin: 3.1 g/dL — ABNORMAL LOW (ref 3.5–5.0)
Alkaline Phosphatase: 50 U/L (ref 38–126)
Anion gap: 9 (ref 5–15)
BUN: 18 mg/dL (ref 6–20)
CO2: 27 mmol/L (ref 22–32)
Calcium: 8.3 mg/dL — ABNORMAL LOW (ref 8.9–10.3)
Chloride: 99 mmol/L (ref 98–111)
Creatinine, Ser: 0.9 mg/dL (ref 0.61–1.24)
GFR calc Af Amer: >60 mL/min (ref >60)
GFR calc non Af Amer: >60 mL/min (ref >60)
Glucose, Bld: 106 mg/dL — ABNORMAL HIGH (ref 70–99)
Potassium: 4 mmol/L (ref 3.5–5.1)
Sodium: 135 mmol/L (ref 135–145)
Total Bilirubin: 1.2 mg/dL (ref 0.3–1.2)
Total Protein: 6.6 g/dL (ref 6.5–8.1)

## 2019-08-14 LAB — CBC
HCT: 39 % (ref 39.0–52.0)
Hemoglobin: 12.6 g/dL — ABNORMAL LOW (ref 13.0–17.0)
MCH: 30.6 pg (ref 26.0–34.0)
MCHC: 32.3 g/dL (ref 30.0–36.0)
MCV: 94.7 fL (ref 80.0–100.0)
Platelets: 221 10*3/uL (ref 150–400)
RBC: 4.12 MIL/uL — ABNORMAL LOW (ref 4.22–5.81)
RDW: 12.5 % (ref 11.5–15.5)
WBC: 18.8 10*3/uL — ABNORMAL HIGH (ref 4.0–10.5)
nRBC: 0 % (ref 0.0–0.2)

## 2019-08-14 LAB — GLUCOSE, CAPILLARY: Glucose-Capillary: 112 mg/dL — ABNORMAL HIGH (ref 70–99)

## 2019-08-14 MED ORDER — KETOROLAC TROMETHAMINE 15 MG/ML IJ SOLN
15.0000 mg | Freq: Four times a day (QID) | INTRAMUSCULAR | Status: AC
  Administered 2019-08-14 – 2019-08-15 (×3): 15 mg via INTRAVENOUS
  Filled 2019-08-14 (×3): qty 1

## 2019-08-14 MED ORDER — FUROSEMIDE 10 MG/ML IJ SOLN
40.0000 mg | Freq: Once | INTRAMUSCULAR | Status: AC
  Administered 2019-08-14: 40 mg via INTRAVENOUS
  Filled 2019-08-14: qty 4

## 2019-08-14 MED ORDER — KETOROLAC TROMETHAMINE 15 MG/ML IJ SOLN: 15.0000 mg | Freq: Four times a day (QID) | INTRAMUSCULAR | Status: DC

## 2019-08-14 MED ORDER — METOCLOPRAMIDE HCL 5 MG/ML IJ SOLN
10.0000 mg | Freq: Four times a day (QID) | INTRAMUSCULAR | Status: AC
  Administered 2019-08-14 – 2019-08-15 (×4): 10 mg via INTRAVENOUS
  Filled 2019-08-14 (×4): qty 2

## 2019-08-14 MED ORDER — RACEPINEPHRINE HCL 2.25 % IN NEBU
0.5000 mL | INHALATION_SOLUTION | Freq: Once | RESPIRATORY_TRACT | Status: AC
  Administered 2019-08-14: 0.5 mL via RESPIRATORY_TRACT
  Filled 2019-08-14: qty 0.5

## 2019-08-14 NOTE — Progress Notes (Signed)
EVENING ROUNDS NOTE :     [address]       [place] [address]             [phone]                 2 Days Post-Op Procedure(s): RIGHT Video Assisted Thoracoscopy (Vats) With Right Upper Lobectomy  Total Length of Stay:  LOS: 2 days  BP 140/83 (BP Location: Right Arm)   Pulse 68   Temp 97.6 F (36.4 C) (Oral)   Resp 18   Ht 6' (1.829 m)   Wt 128.2 kg   SpO2 97%   BMI 38.32 kg/m   .Intake/Output      11/21 0701 - 11/22 0700 11/22 0701 - 11/23 0700   P.O. 960 240   I.V. (mL/kg) 787.9 (6.1) 122.4 (1)   IV Piggyback     Total Intake(mL/kg) 1747.9 (13.6) 362.4 (2.8)   Urine (mL/kg/hr) 750 (0.2) 390 (0.3)   Blood     Chest Tube 170 110   Total Output 920 500   Net +827.9 -137.6          . sodium chloride 10 mL/hr at 08/13/19 0953     Lab Results  Component Value Date   WBC 18.8 (H) 08/14/2019   HGB 12.6 (L) 08/14/2019   HCT 39.0 08/14/2019   PLT 221 08/14/2019   GLUCOSE 106 (H) 08/14/2019   ALT 20 08/14/2019   AST 24 08/14/2019   NA 135 08/14/2019   K 4.0 08/14/2019   CL 99 08/14/2019   CREATININE 0.90 08/14/2019   BUN 18 08/14/2019   CO2 27 08/14/2019   INR 1.1 08/12/2019   This evening respiratory status much improved  Up in chair and comfortable    Grace Isaac MD  Beeper [phone] Office [phone] 08/14/2019 6:29 PM

## 2019-08-14 NOTE — Progress Notes (Addendum)
      [address]       ,[place] [address]             [phone]       2 Days Post-Op Procedure(s): RIGHT Video Assisted Thoracoscopy (Vats) With Right Upper Lobectomy  Subjective: Patient does not have much incisional pain this am (Toradol helped) but has a lot of nausea (denies abdominal pain).  Objective: Vital signs in last 24 hours: Temp:  [97.9 F (36.6 C)-98.1 F (36.7 C)] 97.9 F (36.6 C) (11/22 0736) Pulse Rate:  [53-69] 65 (11/22 0600) Cardiac Rhythm: Normal sinus rhythm (11/22 0830) Resp:  [18-27] 21 (11/22 0600) BP: (112-132)/(68-86) 132/81 (11/22 0317) SpO2:  [91 %-99 %] 93 % (11/22 0600)      Intake/Output from previous day: 11/21 0701 - 11/22 0700 In: 1747.9 [P.O.:960; I.V.:787.9] Out: 920 [Urine:750; Chest Tube:170]   Physical Exam:  Cardiovascular: RRR Pulmonary: Clear to auscultation bilaterally and slightly diminished right apex Abdomen: Soft, non tender, sporadic bowel sounds present. Extremities: Trace bilateral lower extremity edema. Wounds: Clean and dry.  No erythema or signs of infection. Chest Tube:to suction, +1 intermittent air leak with cough  Lab Results: CBC: Recent Labs    08/13/19 0519 08/14/19 0544  WBC 18.9* 18.8*  HGB 12.4* 12.6*  HCT 38.6* 39.0  PLT 200 221   BMET:  Recent Labs    08/13/19 0519 08/14/19 0544  NA 134* 135  K 4.0 4.0  CL 102 99  CO2 24 27  GLUCOSE 120* 106*  BUN 15 18  CREATININE 1.00 0.90  CALCIUM 7.8* 8.3*    PT/INR:  Recent Labs    08/12/19 0630  LABPROT 13.8  INR 1.1   ABG:  INR: Will add last result for INR, ABG once components are confirmed Will add last 4 CBG results once components are confirmed  Assessment/Plan:  1. CV - History of a fib. SR with HR in the low 60's this am. On Toprol XL 25 mg daily and Flecainaide 150 mg bid.  2.  Pulmonary - History of COPD.  Chest tubes with 170 cc of output last 24 hours. Chest tubes are to suction and there is a +1  air leak with cough. On 4 liters of oxygen via . CXR this am continues to show right apical pneumothorax . Xopenex PRN wheezing, Mucinex cough. Encourage incentive spirometer. Await final pathology 3. Mild expected ABL anemia-H and H this am 12.6 and 39 4. GI-at patient's request, will make NPO. Will give a few doses of Reglan. ? Etiology from Oxy as has had previous reaction. Will stop Oxy  Donielle M ZimmermanPA-C 08/14/2019,8:57 AM [phone]  Leave chest tubes to suction currently with small air leak with cough Significant upper airway wheezing, stridor-we will try racemic epinephrine Mild diuresis If no improvement start BiPAP  I have seen and examined Aaron [name] and agree with the above assessment  and plan.  Grace Isaac MD Beeper [phone] Office [phone] 08/14/2019 11:43 AM

## 2019-08-14 NOTE — Plan of Care (Signed)

## 2019-08-14 NOTE — Progress Notes (Signed)
Pt had an episode of nausea. Cold sweats and whhezing . meds admin and feels better.

## 2019-08-15 ENCOUNTER — Ambulatory Visit: Payer: 59 | Admitting: Cardiology

## 2019-08-15 ENCOUNTER — Inpatient Hospital Stay (HOSPITAL_COMMUNITY): Payer: 59

## 2019-08-15 LAB — BASIC METABOLIC PANEL
Anion gap: 7 (ref 5–15)
BUN: 20 mg/dL (ref 6–20)
CO2: 29 mmol/L (ref 22–32)
Calcium: 7.9 mg/dL — ABNORMAL LOW (ref 8.9–10.3)
Chloride: 99 mmol/L (ref 98–111)
Creatinine, Ser: 0.97 mg/dL (ref 0.61–1.24)
GFR calc Af Amer: >60 mL/min (ref >60)
GFR calc non Af Amer: >60 mL/min (ref >60)
Glucose, Bld: 103 mg/dL — ABNORMAL HIGH (ref 70–99)
Potassium: 3.7 mmol/L (ref 3.5–5.1)
Sodium: 135 mmol/L (ref 135–145)

## 2019-08-15 LAB — CBC
HCT: 35.6 % — ABNORMAL LOW (ref 39.0–52.0)
Hemoglobin: 11.5 g/dL — ABNORMAL LOW (ref 13.0–17.0)
MCH: 30.9 pg (ref 26.0–34.0)
MCHC: 32.3 g/dL (ref 30.0–36.0)
MCV: 95.7 fL (ref 80.0–100.0)
Platelets: 197 10*3/uL (ref 150–400)
RBC: 3.72 MIL/uL — ABNORMAL LOW (ref 4.22–5.81)
RDW: 12.4 % (ref 11.5–15.5)
WBC: 13.9 10*3/uL — ABNORMAL HIGH (ref 4.0–10.5)
nRBC: 0 % (ref 0.0–0.2)

## 2019-08-15 MED ORDER — LEVALBUTEROL HCL 0.63 MG/3ML IN NEBU
0.6300 mg | INHALATION_SOLUTION | Freq: Two times a day (BID) | RESPIRATORY_TRACT | Status: DC
  Administered 2019-08-15 – 2019-08-16 (×2): 0.63 mg via RESPIRATORY_TRACT
  Filled 2019-08-15 (×2): qty 3

## 2019-08-15 MED ORDER — LEVALBUTEROL HCL 0.63 MG/3ML IN NEBU: 0.6300 mg | INHALATION_SOLUTION | RESPIRATORY_TRACT | Status: DC | PRN

## 2019-08-15 NOTE — Discharge Summary (Addendum)
[address]       [place] [address]             [phone]      Physician Discharge Summary  Patient ID:  Aaron [name] MRN: [id number] DOB/AGE: [birth year] 58 y.o.  Admit date: 08/12/2019 Discharge date: 08/16/2019  Admission Diagnoses:  Patient Active Problem List   Diagnosis Date Noted  . 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 He smoked about a pack and a half a day. He started at age 58. He quit 5 years ago at age 58. He said multiple family members who died of lung cancer. He had a sister die last year and then another sister died in 2022-10-17. He went to his primary and asked for a low-dose screening CT. It showed an 8 mm right apical nodule. On follow-up CT he had an increased in size to 1.5 cm. PET/CT showed the nodule was markedly hypermetabolic with an SUV of 11.  His appetite is good. He denies weight loss. He does get short of breath with exertion which is felt to be due to his atrial fibrillation. He recently had his dose of flecainide readjusted. He was supposed to have cardioversion today, but had missed a dose of his Eliquis and that procedure was canceled. He can walk up a flight of stairs but it takes a minute to catch his breath after he does so. He is not having any chest pain, pressure, or tightness.   Hospital Course:   Aaron [name] underwent a wedge resection of a right upper lobe nodule and right upper lobectomy with Dr. Roxan Hockey. He tolerated the procedure well and was transferred to the stepdown unit. He was extubated in a timely manner. POD 1 he remained in NSR and actually SB in the 65s. He was on Toprol and Flecainaide but we discontinued the Toprol due to  bradycardia. He has a history of atrial fibrillation. He had some wheezing and xopenex was started as needed. POD 2 he was having some nausea. His HR was improved in the 60s. We made him NPO and gave a few doses of Reglan. His oxycodone was stopped as a potential culprit.  He had significant airway wheezing and stridor. He was given racemic epinephrine which helped. He had a small air leak with cough therefore the chest tubes were left to suction.  POD 3 he was doing better from a respiratory standpoint. We continued the xopenex treatments and he will likely need bronchodilators for home. We weaned his oxygen as tolerated. His abdomen felt better but still no bowel movement. His chest tube was placed to water seal and a chest xray was taken. The chest xray was stable therefore, we removed the chest tube. We also discontinued his PCA pump and his central line. He planned to ambulate. Today, we obtained a 2 view chest xray that showed: Stable size of small apicolateral right-sided pneumothorax. No appreciable change in the appearance of the chest compared to prior study. It was determined that he was stable for discharge home.     Consults: None  Significant Diagnostic Studies:  CLINICAL DATA:  Follow-up pneumothorax.  Lobectomy.  EXAM: CHEST - 1 VIEW SAME DAY  COMPARISON:  08/15/2019 at [phone]  hours  FINDINGS: Stable positioning of right-sided chest tube directed towards the right lung apex. Right IJ central venous catheter terminates at the level of the SVC.  Stable cardiomediastinal contours. Volume loss in the right lung compatible with recent lobectomy. Stable size of a small apicolateral right-sided pneumothorax. Left lung remains clear. Probable trace bilateral pleural effusions.  IMPRESSION: Stable size of small apicolateral right-sided pneumothorax. No appreciable change in the appearance of the chest compared to prior study.   Electronically Signed   By: Davina Poke  M.D.   On: 08/15/2019 13:30    Treatments:    NAME: ANKUR, SNOWDON MEDICAL RECORD [id number] ACCOUNT [id number] DATE OF BIRTH:07/16/1961 FACILITY: [facility] LOCATION: [facility] PHYSICIAN:Devario Bucklew C. Micheil Klaus, MD  OPERATIVE REPORT  DATE OF PROCEDURE:  08/12/2019  PREOPERATIVE DIAGNOSIS:  Right upper lobe lung nodule, suspected T1, N0 lung cancer.  POSTOPERATIVE DIAGNOSIS:  Adenocarcinoma, right upper lobe, clinical stage IA (T1, N0).  PROCEDURE:   1.  Right video-assisted thoracoscopy.   2.  Wedge resection, right upper lobe nodule.  3.  Thoracoscopic right upper lobectomy.  4.  Mediastinal lymph node dissection.  5.  Intercostal nerve blocks from levels 3-10.  SURGEON:  Modesto Charon, MD  ASSISTANT:  Nicholes Rough, PA-C  ANESTHESIA:  General.  FINDINGS:  Frozen section revealed adenocarcinoma.  Bronchial margin free of tumor.  CLINICAL NOTE:  The patient is a 58 year old gentleman with a history of tobacco abuse who had a low-dose screening CT, which showed a right apical lung nodule.  On a followup CT, the nodule had increased in size and on PET CT, it was markedly  hypermetabolic with an SUV of 11.  This was highly suspicious for a primary bronchogenic carcinoma.  It was in association with blebs at the apex and was not amenable to percutaneous or bronchoscopic biopsy.  The patient was advised to undergo wedge  resection for definitive diagnosis, to be followed by lobectomy for definitive treatment if the nodule was cancerous.  The indications, risks, benefits and alternatives were discussed in detail with the patient.  He understood and accepted the risks and  agreed to proceed.  Discharge Exam: Blood pressure 130/74, pulse 70, temperature 98.6 F (37 C), temperature source Oral, resp. rate 20, height 6' (1.829 m), weight 128.2 kg, SpO2 98 %.   General appearance: alert, cooperative and no distress Heart: regular rate and rhythm, S1, S2 normal, no murmur,  click, rub or gallop Lungs: clear to auscultation bilaterally Abdomen: soft, non-tender; bowel sounds normal; no masses,  no organomegaly Extremities: extremities normal, atraumatic, no cyanosis or edema Wound: clean and dry  Disposition: Discharge disposition: 01-Home or Self Care       Discharge Instructions    Discharge patient   Complete by: As directed    Discharge disposition: 01-Home or Self Care   Discharge patient date: 08/16/2019     Allergies as of 08/16/2019      Reactions   Penicillins Hives, Other (See Comments)   Did it involve swelling of the face/tongue/throat, SOB, or low BP? Unknown Did it involve sudden or severe rash/hives, skin peeling, or any reaction on the inside of your mouth or nose? Unknown Did you need to seek medical attention at a hospital or doctor's office? Unknown When did it last happen?Childhood If all above answers are "NO", may proceed with cephalosporin use.   Codeine Other (See Comments)   UNSPECIFIED REACTION       Medication List    TAKE these medications  acetaminophen 500 MG tablet Commonly known as: TYLENOL Take 2 tablets (1,000 mg total) by mouth every 6 (six) hours.   atorvastatin 10 MG tablet Commonly known as: LIPITOR Take 10 mg by mouth every evening.   Eliquis 5 MG Tabs tablet Generic drug: apixaban Take 5 mg by mouth 2 (two) times daily.   escitalopram 10 MG tablet Commonly known as: LEXAPRO Take 10 mg by mouth every evening.   flecainide 150 MG tablet Commonly known as: TAMBOCOR Take 1 tablet (150 mg total) by mouth 2 (two) times daily.   guaiFENesin 600 MG 12 hr tablet Commonly known as: MUCINEX Take 1 tablet (600 mg total) by mouth 2 (two) times daily for 7 days.   levalbuterol 0.63 MG/3ML nebulizer solution Commonly known as: XOPENEX Take 3 mLs (0.63 mg total) by nebulization 2 (two) times daily.   metoprolol succinate 50 MG 24 hr tablet Commonly known as: TOPROL-XL TAKE 1/2 TABLET BY MOUTH  EVERY MORNING and TAKE ONE TABLET BY MOUTH at night with or immediately following a meal What changed: See the new instructions.   temazepam 15 MG capsule Commonly known as: RESTORIL Take 15 mg by mouth at bedtime.   traMADol 50 MG tablet Commonly known as: ULTRAM Take 1 tablet (50 mg total) by mouth every 6 (six) hours as needed (mild pain).      Follow-up Information    Melrose Nakayama, MD Follow up.   Specialty: Cardiothoracic Surgery Why: Please see discharge paperwork for time and date of appointment Contact information: [address] [place] [address] [phone]        Lowella Dandy, NP. Call in 1 day(s).   Specialty: Internal Medicine Why: please arrange follow-up for your COPD Contact information: [place] [address] [phone]           Signed: Elgie Collard 08/16/2019, 7:54 AM

## 2019-08-15 NOTE — Progress Notes (Signed)
   08/15/19 1251  Vitals  Pulse Rate 96  ECG Heart Rate 95  Resp (!) 29  Oxygen Therapy  SpO2 (!) 87 %  MEWS Score  MEWS RR 2  MEWS Pulse 0  MEWS Systolic 0  MEWS LOC 0  MEWS Temp 0  MEWS Score 2  MEWS Score Color Yellow  sats decreased to 87% on 2L/min, stopped and rested sats increased to 91%

## 2019-08-15 NOTE — Op Note (Signed)
NAMEVIRGINIA, FRANCISCO MEDICAL RECORD [id number] ACCOUNT [id number] DATE OF BIRTH:06/14/1961 FACILITY: [facility] LOCATION: [facility] PHYSICIAN: C. , MD  OPERATIVE REPORT  DATE OF PROCEDURE:  08/12/2019  PREOPERATIVE DIAGNOSIS:  Right upper lobe lung nodule, suspected T1, N0 lung cancer.  POSTOPERATIVE DIAGNOSIS:  Adenocarcinoma, right upper lobe, clinical stage IA (T1, N0).  PROCEDURE:   1.  Right video-assisted thoracoscopy.   2.  Wedge resection, right upper lobe nodule.  3.  Thoracoscopic right upper lobectomy.  4.  Mediastinal lymph node dissection.  5.  Intercostal nerve blocks from levels 3-10.  SURGEON:  Modesto Charon, MD  ASSISTANT:  Nicholes Rough, PA-C  ANESTHESIA:  General.  FINDINGS:  Frozen section revealed adenocarcinoma.  Bronchial margin free of tumor.  CLINICAL NOTE:  Mr. Schwarz is a 58 year old gentleman with a history of tobacco abuse who had a low-dose screening CT, which showed a right apical lung nodule.  On a followup CT, the nodule had increased in size and on PET CT, it was markedly  hypermetabolic with an SUV of 11.  This was highly suspicious for a primary bronchogenic carcinoma.  It was in association with blebs at the apex and was not amenable to percutaneous or bronchoscopic biopsy.  The patient was advised to undergo wedge  resection for definitive diagnosis, to be followed by lobectomy for definitive treatment if the nodule was cancerous.  The indications, risks, benefits and alternatives were discussed in detail with the patient.  He understood and accepted the risks and  agreed to proceed.  OPERATIVE NOTE:  The patient was brought to the preoperative holding area on 08/12/2019.  Anesthesia placed a central venous catheter and an arterial blood pressure monitor line.  He was taken to the operating room, anesthetized and intubated with a  double lumen endotracheal tube.  Intravenous antibiotics were administered.  Sequential compression  devices were placed on the calves for DVT prophylaxis.  A Foley catheter was placed.  He was placed in a left lateral decubitus position and the right  chest was prepped and draped in the usual sterile fashion.  Single lung ventilation of the left lung was initiated and was tolerated well throughout the procedure.  A timeout was performed.  A solution containing 20 mL of liposomal bupivacaine, 30 mL of 0.5% bupivacaine and 50 mL of saline was prepared.  This was used for local at the incision sites, as well as for the intercostal nerve blocks.  An incision was made in the 8th interspace in the mid axillary line.  A 5 mm port was inserted.  The thoracoscope was advanced into the chest.  There was good isolation of the right lung with no cross ventilation.  There was no pleural effusion.  A 5 cm  working incision was made in the 4th interspace anterolaterally.  No rib spreading was performed during the procedure.  There were some adhesions at the apex that were taken down with a Harmonic scalpel.  The nodule was easily palpable at the apex.   There were multiple blebs around the nodule.  A wedge resection was performed with sequential firings of an Echelon 45 mm stapler using gold cartridges.  The stapler was placed into an endoscopic retrieval bag, removed and sent for frozen section.  While  awaiting the results of the frozen section, intercostal nerve blocks were performed by injecting 10 mL of the bupivacaine solution in a subpleural plane in the 3rd to the 10th interspaces.  The frozen section returned showing adenocarcinoma.  The inferior  ligament was divided and the pleural reflection was divided at the hilum posteriorly and then anteriorly and finally superiorly.  All lymph nodes that were encountered during the dissection were removed and sent as separate specimens for  permanent pathology.  All appeared benign grossly.  The right superior pulmonary vein was identified.  The middle lobe branch was  identified and preserved.  The upper lobe branches were dissected out, encircled and divided with the endoscopic vascular  stapler.  To achieve a better angle on the vein for passing the stapler, a second port incision was made anterior to the original one.  Next, the anteroapical trunk was dissected out.  It was then encircled and divided with the endoscopic vascular  stapler.  As the dissection was carried along the main pulmonary artery, a small branch was noted adjacent to the bronchus.  This was divided with the Harmonic scalpel and this turned out to be the posterior ascending branch.  The remnant of the artery  as it arose from the main PA was clipped.  The Echelon stapler was used to complete the minor fissure.  Sequential firings using both gold and green cartridges were performed.  The upper lobe bronchus then was dissected free.  There were  multiple lymph nodes along it that were removed.  The Echelon stapler with a green cartridge was placed across the upper lobe bronchus and closed.  A test inflation showed good aeration of the lower and middle lobes.  The stapler was fired,  transecting the bronchus.  The remainder of the attachments of the major fissure between the superior segment and the upper lobe then were completed using the Echelon stapler.  The right upper lobe was placed into an endoscopic retrieval bag, removed and  sent for frozen section of the bronchial margin, which returned with no tumor seen.  After removing the lobe, the right paratracheal area was explored and multiple lymph nodes were removed and sent for pathology.  The chest was copiously irrigated with  warm saline.  A test inflation to 30 cm of water revealed no leakage from the bronchial stump.  A 28-French chest tube was placed through the more anterior port incision and secured with a #1 silk suture.  The middle lobe was tacked to the lower lobe  using a no-knife stapler.  Dual-lung ventilation was resumed.  The  working incision was closed in standard fashion in 3 layers.  The remaining port incision was closed with a 3-0 Vicryl subcuticular suture.  The chest tube was placed to suction.  The  patient was placed back in supine position.  He then was extubated in the operating room and taken to the postanesthetic care unit in good condition.  VN/NUANCE  D:08/15/2019 T:08/15/2019 JOB:[id number]

## 2019-08-15 NOTE — Progress Notes (Addendum)
      [address]       [place] [address]             [phone]      3 Days Post-Op Procedure(s): RIGHT Video Assisted Thoracoscopy (Vats) With Right Upper Lobectomy Subjective: Feels okay this morning. He is planning to get to the chair once his wife arrives. Pain is well controlled.  Objective: Vital signs in last 24 hours: Temp:  [97.6 F (36.4 C)-98.6 F (37 C)] 98 F (36.7 C) (11/23 0755) Pulse Rate:  [62-85] 76 (11/23 0755) Cardiac Rhythm: Normal sinus rhythm (11/23 0755) Resp:  [15-23] 18 (11/23 0755) BP: (109-140)/(66-98) 109/75 (11/23 0755) SpO2:  [93 %-99 %] 97 % (11/23 0755)     Intake/Output from previous day: 11/22 0701 - 11/23 0700 In: 482 [P.O.:240; I.V.:242] Out: 780 [Urine:640; Chest Tube:140] Intake/Output this shift: Total I/O In: 40 [I.V.:40] Out: 10 [Chest Tube:10]  General appearance: alert, cooperative and no distress Heart: regular rate and rhythm, S1, S2 normal, no murmur, click, rub or gallop Lungs: bilateral expiratory wheezing Abdomen: soft, non-tender; bowel sounds normal; no masses,  no organomegaly Extremities: extremities normal, atraumatic, no cyanosis or edema Wound: clean and dry  Lab Results: Recent Labs    08/14/19 0544 08/15/19 0412  WBC 18.8* 13.9*  HGB 12.6* 11.5*  HCT 39.0 35.6*  PLT 221 197   BMET:  Recent Labs    08/14/19 0544 08/15/19 0412  NA 135 135  K 4.0 3.7  CL 99 99  CO2 27 29  GLUCOSE 106* 103*  BUN 18 20  CREATININE 0.90 0.97  CALCIUM 8.3* 7.9*    PT/INR: No results for input(s): LABPROT, INR in the last 72 hours. ABG    Component Value Date/Time   PHART 7.400 08/13/2019 0551   HCO3 26.8 08/13/2019 0551   O2SAT 96.1 08/13/2019 0551   CBG (last 3)  Recent Labs    08/13/19 0819 08/13/19 1136 08/14/19 0848  GLUCAP 101* 97 112*    Assessment/Plan: S/P Procedure(s): RIGHT Video Assisted Thoracoscopy (Vats) With Right Upper Lobectomy  1. CV - History of a fib. SR  with HR in the low 60's this am. On Toprol XL 25 mg daily and Flecainaide 150 mg bid.  2.  Pulmonary - History of COPD.  Chest tubes with 140 cc of output last 24 hours. No air leak so chest tube was changed to water seal . CXR stable with a small apical pneumo. Xopenex PRN wheezing, Mucinex cough. Encourage incentive spirometer. Await final pathology  3. Mild expected ABL anemia-H and H this am 11.5/35.6 4. GI-advance diet as tolerated. No n/v, Oxy stopped  Plan: Leave chest tube to water seal and may remove this afternoon if no air leak. Ambulate if able and OOB to chair.    LOS: 3 days    Aaron [name] 08/15/2019

## 2019-08-15 NOTE — Plan of Care (Signed)

## 2019-08-15 NOTE — Plan of Care (Signed)

## 2019-08-15 NOTE — Progress Notes (Signed)
      [address]       [place] [address]             [phone]        Chest xray evaluated and stable. No air leak. Assessed with Dr. Roxan Hockey. Discontinue chest tube, central line, and PCA pump.   PA/lat in the morning. Hopefully home tomorrow if he remains stable.  Plan communicated to Pecolia Ades, RN   Nicholes Rough, PA-C

## 2019-08-15 NOTE — Plan of Care (Signed)
  Problem: Education: Goal: Knowledge of General Education information will improve Description: Including pain rating scale, medication(s)/side effects and non-pharmacologic comfort measures Outcome: Progressing   Problem: Health Behavior/Discharge Planning: Goal: Ability to manage health-related needs will improve Outcome: Progressing   Problem: Clinical Measurements: Goal: Ability to maintain clinical measurements within normal limits will improve Outcome: Progressing Goal: Will remain free from infection Outcome: Progressing Goal: Diagnostic test results will improve Outcome: Progressing Goal: Respiratory complications will improve Outcome: Progressing Goal: Cardiovascular complication will be avoided Outcome: Progressing   Problem: Activity: Goal: Risk for activity intolerance will decrease Outcome: Progressing   Problem: Nutrition: Goal: Adequate nutrition will be maintained Outcome: Progressing   Problem: Coping: Goal: Level of anxiety will decrease Outcome: Progressing   Problem: Elimination: Goal: Will not experience complications related to bowel motility Outcome: Progressing Goal: Will not experience complications related to urinary retention Outcome: Progressing   Problem: Pain Managment: Goal: General experience of comfort will improve Outcome: Progressing   Problem: Safety: Goal: Ability to remain free from injury will improve Outcome: Progressing   Problem: Skin Integrity: Goal: Risk for impaired skin integrity will decrease Outcome: Progressing   Problem: Education: Goal: Required Educational Video(s) Outcome: Progressing   Problem: Clinical Measurements: Goal: Postoperative complications will be avoided or minimized Outcome: Progressing   Problem: Skin Integrity: Goal: Demonstration of wound healing without infection will improve Outcome: Progressing   Problem: Education: Goal: Knowledge of disease or condition will improve Outcome:  Progressing Goal: Knowledge of the prescribed therapeutic regimen will improve Outcome: Progressing   Problem: Activity: Goal: Risk for activity intolerance will decrease Outcome: Progressing   Problem: Cardiac: Goal: Will achieve and/or maintain hemodynamic stability Outcome: Progressing   Problem: Clinical Measurements: Goal: Postoperative complications will be avoided or minimized Outcome: Progressing   Problem: Respiratory: Goal: Respiratory status will improve Outcome: Progressing   Problem: Pain Management: Goal: Pain level will decrease Outcome: Progressing   Problem: Skin Integrity: Goal: Wound healing without signs and symptoms infection will improve Outcome: Progressing

## 2019-08-16 ENCOUNTER — Inpatient Hospital Stay (HOSPITAL_COMMUNITY): Payer: 59

## 2019-08-16 LAB — BPAM RBC
Blood Product Expiration Date: 202012222359
Blood Product Expiration Date: 202012222359
ISSUE DATE / TIME: 202011200841
ISSUE DATE / TIME: 202011200841
Unit Type and Rh: 5100
Unit Type and Rh: 5100

## 2019-08-16 LAB — TYPE AND SCREEN
ABO/RH(D): O POS
Antibody Screen: NEGATIVE
Unit division: 0
Unit division: 0

## 2019-08-16 MED ORDER — GUAIFENESIN ER 600 MG PO TB12: 600.0000 mg | ORAL_TABLET | Freq: Two times a day (BID) | ORAL | 0 refills | Status: AC

## 2019-08-16 MED ORDER — ACETAMINOPHEN 500 MG PO TABS: 1000.0000 mg | ORAL_TABLET | Freq: Four times a day (QID) | ORAL | 0 refills | Status: DC

## 2019-08-16 MED ORDER — TRAMADOL HCL 50 MG PO TABS: 50.0000 mg | ORAL_TABLET | Freq: Four times a day (QID) | ORAL | 0 refills | Status: DC | PRN

## 2019-08-16 MED ORDER — LEVALBUTEROL HCL 0.63 MG/3ML IN NEBU: 0.6300 mg | INHALATION_SOLUTION | Freq: Two times a day (BID) | RESPIRATORY_TRACT | 12 refills | Status: DC

## 2019-08-16 NOTE — Progress Notes (Signed)
      [address]       [place] [address]             [phone]      4 Days Post-Op Procedure(s): RIGHT Video Assisted Thoracoscopy (Vats) With Right Upper Lobectomy Subjective: Feels okay this morning. Getting a treatment and breathing better.   Objective: Vital signs in last 24 hours: Temp:  [97.6 F (36.4 C)-99.7 F (37.6 C)] 98.6 F (37 C) (11/24 0314) Pulse Rate:  [70-96] 70 (11/24 0733) Cardiac Rhythm: Normal sinus rhythm (11/24 0400) Resp:  [17-29] 20 (11/24 0733) BP: (109-137)/(69-84) 130/74 (11/24 0314) SpO2:  [86 %-99 %] 98 % (11/24 0733)     Intake/Output from previous day: 11/23 0701 - 11/24 0700 In: 579.2 [P.O.:480; I.V.:99.2] Out: 560 [Urine:500; Chest Tube:60] Intake/Output this shift: No intake/output data recorded.  General appearance: alert, cooperative and no distress Heart: regular rate and rhythm, S1, S2 normal, no murmur, click, rub or gallop Lungs: clear to auscultation bilaterally Abdomen: soft, non-tender; bowel sounds normal; no masses,  no organomegaly Extremities: extremities normal, atraumatic, no cyanosis or edema Wound: clean and dry  Lab Results: Recent Labs    08/14/19 0544 08/15/19 0412  WBC 18.8* 13.9*  HGB 12.6* 11.5*  HCT 39.0 35.6*  PLT 221 197   BMET:  Recent Labs    08/14/19 0544 08/15/19 0412  NA 135 135  K 4.0 3.7  CL 99 99  CO2 27 29  GLUCOSE 106* 103*  BUN 18 20  CREATININE 0.90 0.97  CALCIUM 8.3* 7.9*    PT/INR: No results for input(s): LABPROT, INR in the last 72 hours. ABG    Component Value Date/Time   PHART 7.400 08/13/2019 0551   HCO3 26.8 08/13/2019 0551   O2SAT 96.1 08/13/2019 0551   CBG (last 3)  Recent Labs    08/13/19 0819 08/13/19 1136 08/14/19 0848  GLUCAP 101* 97 112*    Assessment/Plan: S/P Procedure(s): RIGHT Video Assisted Thoracoscopy (Vats) With Right Upper Lobectomy  1. CV - History of a fib. SRwith HR in the low 70's this am. On Toprol XL 25 mg  daily and Flecainaide 150 mg bid.  2. Pulmonary - History of COPD. Chest tubes removed yesterday.CXR stable.  XopenexPRNwheezing, Mucinex cough.Encourage incentive spirometer. Await final pathology  3.Mild expected ABL anemia-H and H this am 11.5/35.6 4. GI-advance diet as tolerated. No n/v, Oxy stopped  Plan: Discharge today. Instructions reviewed. Will prescribe some xopenex treatments for home. Follow-up arranged.     LOS: 4 days    Elgie Collard 08/16/2019

## 2019-08-16 NOTE — Discharge Instructions (Signed)

## 2019-08-16 NOTE — Plan of Care (Signed)
  Problem: Education: Goal: Knowledge of General Education information will improve Description: Including pain rating scale, medication(s)/side effects and non-pharmacologic comfort measures Outcome: Adequate for Discharge   Problem: Health Behavior/Discharge Planning: Goal: Ability to manage health-related needs will improve Outcome: Adequate for Discharge   Problem: Clinical Measurements: Goal: Ability to maintain clinical measurements within normal limits will improve Outcome: Adequate for Discharge Goal: Will remain free from infection Outcome: Adequate for Discharge Goal: Diagnostic test results will improve Outcome: Adequate for Discharge Goal: Respiratory complications will improve Outcome: Adequate for Discharge Goal: Cardiovascular complication will be avoided Outcome: Adequate for Discharge   Problem: Activity: Goal: Risk for activity intolerance will decrease Outcome: Adequate for Discharge   Problem: Nutrition: Goal: Adequate nutrition will be maintained Outcome: Adequate for Discharge   Problem: Coping: Goal: Level of anxiety will decrease Outcome: Adequate for Discharge   Problem: Elimination: Goal: Will not experience complications related to bowel motility Outcome: Adequate for Discharge Goal: Will not experience complications related to urinary retention Outcome: Adequate for Discharge   Problem: Pain Managment: Goal: General experience of comfort will improve Outcome: Adequate for Discharge   Problem: Safety: Goal: Ability to remain free from injury will improve Outcome: Adequate for Discharge   Problem: Skin Integrity: Goal: Risk for impaired skin integrity will decrease Outcome: Adequate for Discharge   Problem: Education: Goal: Required Educational Video(s) Outcome: Adequate for Discharge   Problem: Clinical Measurements: Goal: Postoperative complications will be avoided or minimized Outcome: Adequate for Discharge   Problem: Skin  Integrity: Goal: Demonstration of wound healing without infection will improve Outcome: Adequate for Discharge   Problem: Education: Goal: Knowledge of disease or condition will improve Outcome: Adequate for Discharge Goal: Knowledge of the prescribed therapeutic regimen will improve Outcome: Adequate for Discharge   Problem: Activity: Goal: Risk for activity intolerance will decrease Outcome: Adequate for Discharge   Problem: Cardiac: Goal: Will achieve and/or maintain hemodynamic stability Outcome: Adequate for Discharge   Problem: Clinical Measurements: Goal: Postoperative complications will be avoided or minimized Outcome: Adequate for Discharge   Problem: Respiratory: Goal: Respiratory status will improve Outcome: Adequate for Discharge   Problem: Pain Management: Goal: Pain level will decrease Outcome: Adequate for Discharge   Problem: Skin Integrity: Goal: Wound healing without signs and symptoms infection will improve Outcome: Adequate for Discharge

## 2019-08-16 NOTE — Progress Notes (Signed)
Discharge instructions given to patient, post op follow up appointment given. Patients questions were answered. IV removed and intact upon removal. Patient expressed understanding of all follow up appointments and discharge instructions. Patient discharging with wife to home.

## 2019-08-17 LAB — SURGICAL PATHOLOGY

## 2019-08-22 ENCOUNTER — Other Ambulatory Visit: Payer: Self-pay

## 2019-08-22 DIAGNOSIS — Z902 Acquired absence of lung [part of]: Secondary | ICD-10-CM

## 2019-08-23 ENCOUNTER — Ambulatory Visit (INDEPENDENT_AMBULATORY_CARE_PROVIDER_SITE_OTHER): Payer: Self-pay | Admitting: Thoracic Surgery (Cardiothoracic Vascular Surgery)

## 2019-08-23 ENCOUNTER — Ambulatory Visit
Admission: RE | Admit: 2019-08-23 | Discharge: 2019-08-23 | Disposition: A | Discharge status: Home / Self Care | Payer: 59 | Source: Ambulatory Visit | Attending: Thoracic Surgery (Cardiothoracic Vascular Surgery) | Admitting: Thoracic Surgery (Cardiothoracic Vascular Surgery)

## 2019-08-23 ENCOUNTER — Encounter: Payer: Self-pay | Admitting: Thoracic Surgery (Cardiothoracic Vascular Surgery)

## 2019-08-23 ENCOUNTER — Other Ambulatory Visit: Payer: Self-pay

## 2019-08-23 VITALS — BP 115/69 | HR 58 | Temp 98.0°F | Resp 24 | Ht 72.0 in | Wt 277.0 lb

## 2019-08-23 DIAGNOSIS — Z902 Acquired absence of lung [part of]: Secondary | ICD-10-CM

## 2019-08-23 DIAGNOSIS — C3411 Malignant neoplasm of upper lobe, right bronchus or lung: Secondary | ICD-10-CM

## 2019-08-23 NOTE — Progress Notes (Signed)
[address]       [place] [address]             [phone]     HPI: Mr. Kuhner returns for a unscheduled follow-up visit  Ostin Mathey is a 58 year old man with a history of obesity, atrial fibrillation, COPD, dyslipidemia, and tobacco abuse.  He quit smoking 5 years ago.  He had a low-dose screening CT which showed an 8 mm right apical nodule.  A follow-up CT showed an increase in size and on PET the nodule was hypermetabolic with an SUV of 11.  I did a right VATS lobectomy on 08/12/2019.  His postoperative course was uncomplicated and he went home on day 4.  He called yesterday saying that he was having some shortness of breath with ambulation and also felt short of breath when he was trying to lie down.  That was causing him to have difficulty sleeping.  He complains of some low-grade fevers but nothing greater than 99.5.  He has some incisional pain but is only taking narcotics once or twice a day.  He has not noticed any leg swelling.  Past Medical History:  Diagnosis Date  . Anxiety   . Arrhythmia   . Complication of anesthesia   . Dysrhythmia    afib  . Mixed dyslipidemia   . Paroxysmal atrial fibrillation (HCC)   . PONV (postoperative nausea and vomiting)     Current Outpatient Medications  Medication Sig Dispense Refill  . acetaminophen (TYLENOL) 500 MG tablet Take 2 tablets (1,000 mg total) by mouth every 6 (six) hours. 30 tablet 0  . atorvastatin (LIPITOR) 10 MG tablet Take 10 mg by mouth every evening.     Marland Kitchen ELIQUIS 5 MG TABS tablet Take 5 mg by mouth 2 (two) times daily.    Marland Kitchen escitalopram (LEXAPRO) 10 MG tablet Take 10 mg by mouth every evening.     . flecainide (TAMBOCOR) 150 MG tablet Take 1 tablet (150 mg total) by mouth 2 (two) times daily. 90 tablet 3  . guaiFENesin (MUCINEX) 600 MG 12 hr tablet Take 1 tablet (600 mg total) by mouth 2 (two) times daily for 7 days. 14 tablet 0  . levalbuterol (XOPENEX) 0.63 MG/3ML nebulizer solution Take 3  mLs (0.63 mg total) by nebulization 2 (two) times daily. 3 mL 12  . metoprolol succinate (TOPROL-XL) 50 MG 24 hr tablet TAKE 1/2 TABLET BY MOUTH EVERY MORNING and TAKE ONE TABLET BY MOUTH at night with or immediately following a meal (Patient taking differently: Take 25-50 mg by mouth See admin instructions. Take 0.5 tablet (25 mg) by mouth in the morning & take 1 tablet (50 mg) by mouth at night.) 30 tablet 4  . temazepam (RESTORIL) 15 MG capsule Take 15 mg by mouth at bedtime.     . traMADol (ULTRAM) 50 MG tablet Take 1 tablet (50 mg total) by mouth every 6 (six) hours as needed (mild pain). 30 tablet 0   No current facility-administered medications for this visit.     Physical Exam BP 115/69 (BP Location: Right Arm, Patient Position: Sitting, Cuff Size: Normal)   Pulse (!) 58   Temp 98 F (36.7 C)   Resp (!) 24   Ht 6' (1.829 m)   Wt 277 lb (125.6 kg)   SpO2 95% Comment: RA  BMI 37.36 kg/m  58 year old man in no acute distress Alert and oriented x3 with no focal deficits Incisions clean dry and  intact with no erythema Lungs diminished breath sounds at right base, otherwise clear, no rales or wheezing Cardiac mildly irregular  Diagnostic Tests: CHEST - 2 VIEW  COMPARISON:  08/16/2019  FINDINGS: Postsurgical changes from right upper lobectomy with perihilar atelectasis. Small right pleural effusion with loculated fluid along the apex with a small bubble of air in the right apical pleural fluid. Right lung volume loss. Left lung is clear. No left pneumothorax. Stable cardiomediastinal silhouette. No aggressive osseous lesion.  IMPRESSION: Postsurgical changes from right upper lobectomy with perihilar atelectasis. Small right pleural effusion with loculated fluid along the apex with a small bubble of air in the right apical pleural fluid. Right lung volume loss.   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  He is now about 11 days postoperatively.  Most of his complaints are pretty standard for the early postoperative period after lobectomy.  He has noted some low-grade fevers but nothing greater than 100.  He knows to call if he were to have a fever greater than 100.4.  He actually was able to get some sleep last night and feels better today.  He does have some residual space and fluid within that space on the right side.  There is not enough effusion to warrant any intervention.  I recommended that he return next week for scheduled appointment.  In the meantime if he were to have a fever greater than 100.4 or worsening shortness of breath or pain he should call us back.  Plan: Will return next week for scheduled follow-up appointment  Melrose Nakayama, MD [facility] [phone]

## 2019-08-23 NOTE — Progress Notes (Signed)
      [address]       [place] [address]             [phone]       

## 2019-08-25 ENCOUNTER — Other Ambulatory Visit: Payer: Self-pay | Admitting: *Deleted

## 2019-08-25 NOTE — Progress Notes (Signed)
The proposed treatment discussed in cancer conference 08/25/2019 is for discussion purpose only and is not a binding recommendation.  The patient was not physically examined nor present for their treatment options.  Therefore, final treatment plans cannot be decided.

## 2019-08-29 ENCOUNTER — Other Ambulatory Visit: Payer: Self-pay | Admitting: Thoracic Surgery (Cardiothoracic Vascular Surgery)

## 2019-08-29 DIAGNOSIS — Z902 Acquired absence of lung [part of]: Secondary | ICD-10-CM

## 2019-08-30 ENCOUNTER — Encounter: Payer: Self-pay | Admitting: Thoracic Surgery (Cardiothoracic Vascular Surgery)

## 2019-08-30 ENCOUNTER — Ambulatory Visit
Admission: RE | Admit: 2019-08-30 | Discharge: 2019-08-30 | Disposition: A | Discharge status: Home / Self Care | Payer: 59 | Source: Ambulatory Visit | Attending: Thoracic Surgery (Cardiothoracic Vascular Surgery) | Admitting: Thoracic Surgery (Cardiothoracic Vascular Surgery)

## 2019-08-30 ENCOUNTER — Other Ambulatory Visit: Payer: Self-pay

## 2019-08-30 ENCOUNTER — Ambulatory Visit (INDEPENDENT_AMBULATORY_CARE_PROVIDER_SITE_OTHER): Payer: Self-pay | Admitting: Thoracic Surgery (Cardiothoracic Vascular Surgery)

## 2019-08-30 VITALS — BP 108/66 | HR 52 | Temp 97.9°F | Resp 20 | Ht 72.0 in | Wt 270.0 lb

## 2019-08-30 DIAGNOSIS — R911 Solitary pulmonary nodule: Secondary | ICD-10-CM

## 2019-08-30 DIAGNOSIS — Z902 Acquired absence of lung [part of]: Secondary | ICD-10-CM

## 2019-08-30 NOTE — Progress Notes (Signed)
[address]       ,[place] [address]             [phone]     HPI: Aaron [name] returns for scheduled follow-up visit  Aaron [name] is a 58 year old man with a history of tobacco abuse, COPD obesity, atrial fibrillation, and mixed dyslipidemia.  He quit smoking 5 years ago at age 58.  He had a low-dose screening CT which showed an 8 mm right apical nodule.  On the follow-up CT had increased in size to 1.5 cm.  On PET the nodule was highly metabolic with an SUV of 11.  I did a thoracoscopic right upper lobectomy on 08/12/2019.  He went home on postoperative day #4.  I saw him back in the office last week.  He was having some shortness of breath and low-grade fevers.  Today he returns for scheduled follow-up.  He feels well.  He is not having any significant pain.  He is taking 1 pain pill before he goes to bed at night to help him rest.  He does find it hard to get comfortable when he first lies down.  He is anxious to return to work.  Past Medical History:  Diagnosis Date  . Anxiety   . Arrhythmia   . Complication of anesthesia   . Dysrhythmia    afib  . Mixed dyslipidemia   . Paroxysmal atrial fibrillation (HCC)   . PONV (postoperative nausea and vomiting)    Current Outpatient Medications  Medication Sig Dispense Refill  . acetaminophen (TYLENOL) 500 MG tablet Take 2 tablets (1,000 mg total) by mouth every 6 (six) hours. 30 tablet 0  . atorvastatin (LIPITOR) 10 MG tablet Take 10 mg by mouth every evening.     Marland Kitchen ELIQUIS 5 MG TABS tablet Take 5 mg by mouth 2 (two) times daily.    Marland Kitchen escitalopram (LEXAPRO) 10 MG tablet Take 10 mg by mouth every evening.     . flecainide (TAMBOCOR) 150 MG tablet Take 1 tablet (150 mg total) by mouth 2 (two) times daily. 90 tablet 3  . levalbuterol (XOPENEX) 0.63 MG/3ML nebulizer solution Take 3 mLs (0.63 mg total) by nebulization 2 (two) times daily. 3 mL 12  . metoprolol succinate (TOPROL-XL) 50 MG 24 hr tablet TAKE 1/2  TABLET BY MOUTH EVERY MORNING and TAKE ONE TABLET BY MOUTH at night with or immediately following a meal (Patient taking differently: Take 25-50 mg by mouth See admin instructions. Take 0.5 tablet (25 mg) by mouth in the morning & take 1 tablet (50 mg) by mouth at night.) 30 tablet 4  . temazepam (RESTORIL) 15 MG capsule Take 15 mg by mouth at bedtime.     . traMADol (ULTRAM) 50 MG tablet Take 1 tablet (50 mg total) by mouth every 6 (six) hours as needed (mild pain). 30 tablet 0   No current facility-administered medications for this visit.     Physical Exam BP 108/66 (BP Location: Right Arm)   Pulse (!) 52   Temp 97.9 F (36.6 C) (Skin)   Resp 20   Ht 6' (1.829 m)   Wt 270 lb (122.5 kg)   SpO2 94% Comment: RA  BMI 36.62 kg/m  Aaron [name] is a 58 year old gentleman in no acute distress Alert and oriented x3 with no focal deficits Lungs diminished at right base, otherwise clear Incisions healing well Cardiac bradycardic and regular No peripheral edema  Diagnostic Tests: CHEST - 2 VIEW  COMPARISON:  08/23/2019  FINDINGS: Postsurgical changes in the right hemithorax with similar appearance of volume loss and right pleural fluid tracking to the apex. Left lung remains clear. Stable cardiomediastinal contours  IMPRESSION: Stable postoperative appearance.   Electronically Signed   By: Macy Mis M.D.   On: 08/30/2019 10:50 I personally reviewed the chest x-ray and concur with the findings noted above  Impression: Aaron [name] is a 58 year old man with a history of tobacco abuse and COPD who had a low-dose screening CT earlier this year.  It showed an 8 mm right apical nodule.  On follow-up CT the nodule had increased in size and was markedly hypermetabolic on PET/CT.  I did a thoracoscopic right upper lobectomy on 08/12/2019.  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  From a surgical standpoint he is doing extremely well.  He is having minimal  discomfort.  He is only requiring 1 pain pill at night before he goes to bed to help him get comfortable.  I encouraged him to walk on a regular basis.  He may begin driving on a limited basis.  Appropriate precautions were discussed.  There are no restrictions on his physical activities, but he was advised to build into new activities gradually.  He wishes to return his to work as soon as possible.  I recommended that he plan to return to work on 09/19/2019.  In the meantime if he needs to attend an occasional meeting or check emails or something along those lines that is fine.  He is requesting referral to Dr. Bobby Rumpf in [place] who treated his sister previously.  We will make that referral  Plan: Referral to Dr. Lavera Guise Return in 2 months with PA and lateral chest x-ray  Aaron Nakayama, MD [facility] [phone]

## 2019-09-05 DIAGNOSIS — C3411 Malignant neoplasm of upper lobe, right bronchus or lung: Secondary | ICD-10-CM

## 2019-09-29 ENCOUNTER — Other Ambulatory Visit: Payer: Self-pay | Admitting: Cardiology

## 2019-10-27 ENCOUNTER — Other Ambulatory Visit: Payer: Self-pay

## 2019-10-27 ENCOUNTER — Encounter: Payer: Self-pay | Admitting: Cardiology

## 2019-10-27 ENCOUNTER — Ambulatory Visit (INDEPENDENT_AMBULATORY_CARE_PROVIDER_SITE_OTHER): Payer: 59 | Admitting: Cardiology

## 2019-10-27 VITALS — BP 108/70 | HR 53 | Ht 72.0 in | Wt 271.0 lb

## 2019-10-27 DIAGNOSIS — E782 Mixed hyperlipidemia: Secondary | ICD-10-CM

## 2019-10-27 DIAGNOSIS — I480 Paroxysmal atrial fibrillation: Secondary | ICD-10-CM

## 2019-10-27 DIAGNOSIS — Z902 Acquired absence of lung [part of]: Secondary | ICD-10-CM

## 2019-10-27 DIAGNOSIS — I208 Other forms of angina pectoris: Secondary | ICD-10-CM

## 2019-10-27 DIAGNOSIS — I2089 Other forms of angina pectoris: Secondary | ICD-10-CM

## 2019-10-27 DIAGNOSIS — R0600 Dyspnea, unspecified: Secondary | ICD-10-CM

## 2019-10-27 DIAGNOSIS — R0609 Other forms of dyspnea: Secondary | ICD-10-CM

## 2019-10-27 HISTORY — DX: Other forms of angina pectoris: I20.89

## 2019-10-27 HISTORY — DX: Other forms of angina pectoris: I20.8

## 2019-10-27 NOTE — Patient Instructions (Addendum)
Medication Instructions:  No medication changes *If you need a refill on your cardiac medications before your next appointment, please call your pharmacy*  Lab Work: You will need to have a BMET 1 week prior to your CT.  The order has been placed in the computer.  You can come to the office Monday through Friday 8:30 am to 4:30 pm. Closed 12:00 to 1:15. If you have labs (blood work) drawn today and your tests are completely normal, you will receive your results only by: Marland Kitchen MyChart Message (if you have MyChart) OR . A paper copy in the mail If you have any lab test that is abnormal or we need to change your treatment, we will call you to review the results.  Testing/Procedures: Your physician has requested that you have cardiac CT. Cardiac computed tomography (CT) is a painless test that uses an x-ray machine to take clear, detailed pictures of your heart. For further information please visit [email or web address]. Please follow instruction sheet as given.  Your cardiac CT will be scheduled at:   [facility] [address] [place], [place] [address] [phone]   [facility], please arrive at the [facility] main entrance of [facility] 30 minutes prior to test start time. Proceed to the [facility] Radiology Department (first floor) to check-in and test prep.    Please follow these instructions carefully (unless otherwise directed):  Hold all erectile dysfunction medications at least 3 days (72 hrs) prior to test.  On the Night Before the Test: . Be sure to Drink plenty of water. . Do not consume any caffeinated/decaffeinated beverages or chocolate 12 hours prior to your test. . Do not take any antihistamines 12 hours prior to your test.  On the Day of the Test: . Drink plenty of water. Do not drink any water within one hour of the test. . Do not eat any food 4 hours prior to the test. . You may take your regular medications prior to the test.   . Take metoprolol (Lopressor) two hours prior to test. . HOLD Furosemide/Hydrochlorothiazide morning of the test.          After the Test: . Drink plenty of water. . After receiving IV contrast, you may experience a mild flushed feeling. This is normal. . On occasion, you may experience a mild rash up to 24 hours after the test. This is not dangerous. If this occurs, you can take Benadryl 25 mg and increase your fluid intake. . If you experience trouble breathing, this can be serious. If it is severe call 911 IMMEDIATELY. If it is mild, please call our office. . If you take any of these medications: Glipizide/Metformin, Avandament, Glucavance, please do not take 48 hours after completing test unless otherwise instructed.   Once we have confirmed authorization from your insurance company, we will call you to set up a date and time for your test.   For non-scheduling related questions, please contact the cardiac imaging nurse navigator should you have any questions/concerns: Marchia Bond, RN Navigator Cardiac Imaging [facility] [phone] mobile     Follow-Up: At [facility], you and your health needs are our priority.  As part of our continuing mission to provide you with exceptional heart care, we have created designated Provider Care Teams.  These Care Teams include your primary Cardiologist (physician) and Advanced Practice Providers (APPs -  Physician Assistants and Nurse Practitioners) who all work together to provide you with  the care you need, when you need it.  Your next appointment:   1 month(s)  The format for your next appointment:   In Person  Provider:   Jyl Heinz, MD  Other Instructions  CT Angiogram  A CT angiogram is a procedure to look at the blood vessels in various areas of the body. For this procedure, a large X-ray machine, called a CT scanner, takes detailed pictures of blood vessels that have been injected with a  dye (contrast material). A CT angiogram allows your health care provider to see how well blood is flowing to the area of your body that is being checked. Your health care provider will be able to see if there are any problems, such as a blockage. Tell a health care provider about:  Any allergies you have.  All medicines you are taking, including vitamins, herbs, eye drops, creams, and over-the-counter medicines.  Any problems you or family members have had with anesthetic medicines.  Any blood disorders you have.  Any surgeries you have had.  Any medical conditions you have.  Whether you are pregnant or may be pregnant.  Whether you are breastfeeding.  Any anxiety disorders, chronic pain, or other conditions you have that may increase your stress or prevent you from lying still. What are the risks? Generally, this is a safe procedure. However, problems may occur, including:  Infection.  Bleeding.  Allergic reactions to medicines or dyes.  Damage to other structures or organs.  Kidney damage from the dye or contrast that is used.  Increased risk of cancer from radiation exposure. This risk is low. Talk with your health care provider about: ? The risks and benefits of testing. ? How you can receive the lowest dose of radiation. What happens before the procedure?  Wear comfortable clothing and remove any jewelry.  Follow instructions from your health care provider about eating and drinking. For most people, instructions may include these actions: ? For 12 hours before the test, avoid caffeine. This includes tea, coffee, soda, and energy drinks or pills. ? For 3-4 hours before the test, stop eating or drinking anything but water. ? Stay well hydrated by continuing to drink water before the exam. This will help to clear the contrast dye from your body after the test.  Ask your health care provider about changing or stopping your regular medicines. This is especially important  if you are taking diabetes medicines or blood thinners. What happens during the procedure?  An IV tube will be inserted into one of your veins.  You will be asked to lie on an exam table. This table will slide in and out of the CT machine during the procedure.  Contrast dye will be injected into the IV tube. You might feel warm, or you may get a metallic taste in your mouth.  The table that you are lying on will move into the CT machine tunnel for the scan.  The person running the machine will give you instructions while the scans are being done. You may be asked to: ? Keep your arms above your head. ? Hold your breath. ? Stay very still, even if the table is moving.  When the scanning is complete, you will be moved out of the machine.  The IV tube will be removed. The procedure may vary among health care providers and hospitals. What happens after the procedure?  You might feel warm, or you may get a metallic taste in your mouth.  You may  be asked to drink water or other fluids to wash (flush) the contrast material out of your body.  It is up to you to get the results of your procedure. Ask your health care provider, or the department that is doing the procedure, when your results will be ready. Summary  A CT angiogram is a procedure to look at the blood vessels in various areas of the body.  You will need to stay very still during the exam.  You may be asked to drink water or other fluids to wash (flush) the contrast material out of your body after your scan. This information is not intended to replace advice given to you by your health care provider. Make sure you discuss any questions you have with your health care provider. Document Revised: 11/18/2018 Document Reviewed: 05/08/2016 Elsevier Patient Education  [place].

## 2019-10-27 NOTE — Progress Notes (Signed)
Cardiology Office Note:    Date:  10/27/2019   ID:  Aaron [name], DOB Jul 26, 1961, MRN [id number]  PCP:  Lowella Dandy, NP  Cardiologist:  Jenean Lindau, MD   Referring MD: Lowella Dandy, NP    ASSESSMENT:    1. PAF (paroxysmal atrial fibrillation) ([place])   2. S/P lobectomy of lung   3. Mixed dyslipidemia    PLAN:    In order of problems listed above:  1. Anginal equivalent: Dyspnea on exertion: I feel in this gentleman with multiple risk factors for coronary artery disease that dyspnea on exertion is an anginal equivalent.  I want to assess him for the same.  He has decreased effort tolerance and before I asked him to go on a vigorous exercise program I want to make sure he has no coronary artery disease that is significant or obstructive.  In this context I discussed with him about CT coronary angiography and is agreeable.  Procedure benefits and potential is explained and he vocalized understanding.  Further recommendations will be made based on the findings of the coronary angiography. 2. Paroxysmal atrial fibrillation:I discussed with the patient atrial fibrillation, disease process. Management and therapy including rate and rhythm control, anticoagulation benefits and potential risks were discussed extensively with the patient. Patient had multiple questions which were answered to patient's satisfaction. 3. EKG reveals sinus rhythm nonspecific ST-T changes 4. Diet was discussed for dyslipidemia and overweight status and he vocalized understanding and promised to do better. 5. Follow-up appointment in a month or earlier if he has any concerns.  Patient has multiple complex issues and total evaluation time was 35 minutes.   Medication Adjustments/Labs and Tests Ordered: Current medicines are reviewed at length with the patient today.  Concerns regarding medicines are outlined above.  No orders of the defined types were placed in this encounter.  No orders of the defined types  were placed in this encounter.    Chief Complaint  Patient presents with  . Follow-up     History of Present Illness:    Aaron [name] is a 59 y.o. male.  Patient has past medical history approximately fibrillation.  He has had a lobectomy of the lung.  Subsequently is done well.  He gives history of dyspnea on exertion.  This is of some concern.  He tells me for this reason he cannot exercise much.  He is in stable sinus rhythm clinically.  He is overweight.  He denies any chest pain orthopnea or PND.  He tells me that for this reason he leads a sedentary lifestyle.  At the time of my evaluation, the patient is alert awake oriented and in no distress.  Past Medical History:  Diagnosis Date  . Anxiety   . Arrhythmia   . Complication of anesthesia   . Dysrhythmia    afib  . Mixed dyslipidemia   . Paroxysmal atrial fibrillation (HCC)   . PONV (postoperative nausea and vomiting)     Past Surgical History:  Procedure Laterality Date  . BACK SURGERY    . CARDIOVERSION N/A 07/15/2019   Procedure: CARDIOVERSION;  Surgeon: Pixie Casino, MD;  Location: [facility] ENDOSCOPY;  Service: Cardiovascular;  Laterality: N/A;  . CHOLECYSTECTOMY    . TEE WITH CARDIOVERSION    . TEE WITHOUT CARDIOVERSION N/A 07/15/2019   Procedure: TRANSESOPHAGEAL ECHOCARDIOGRAM (TEE);  Surgeon: Pixie Casino, MD;  Location: [facility] ENDOSCOPY;  Service: Cardiovascular;  Laterality: N/A;  . VIDEO ASSISTED THORACOSCOPY (VATS)/ LOBECTOMY  08/12/2019   Procedure: RIGHT Video Assisted Thoracoscopy (Vats) With Right Upper Lobectomy;  Surgeon: Melrose Nakayama, MD;  Location: [facility] OR;  Service: Thoracic;;    Current Medications: Current Meds  Medication Sig  . acetaminophen (TYLENOL) 500 MG tablet Take 2 tablets (1,000 mg total) by mouth every 6 (six) hours.  Marland Kitchen atorvastatin (LIPITOR) 10 MG tablet Take 10 mg by mouth every evening.   Marland Kitchen ELIQUIS 5 MG TABS tablet Take 5 mg by mouth 2 (two) times daily.  Marland Kitchen escitalopram  (LEXAPRO) 10 MG tablet Take 10 mg by mouth every evening.   . flecainide (TAMBOCOR) 150 MG tablet Take 1 tablet (150 mg total) by mouth 2 (two) times daily.  . metoprolol succinate (TOPROL-XL) 50 MG 24 hr tablet TAKE 1/2 TABLET BY MOUTH EVERY MORNING and TAKE ONE TABLET BY MOUTH at night with or immediately following a meal  . temazepam (RESTORIL) 15 MG capsule Take 15 mg by mouth at bedtime.   . traMADol (ULTRAM) 50 MG tablet Take 1 tablet (50 mg total) by mouth every 6 (six) hours as needed (mild pain).     Allergies:   Penicillins and Codeine   Social History   Socioeconomic History  . Marital status: Married    Spouse name: Not on file  . Number of children: Not on file  . Years of education: Not on file  . Highest education level: Not on file  Occupational History  . Not on file  Tobacco Use  . Smoking status: Former Smoker    Packs/day: 1.50    Years: 40.00    Pack years: 60.00    Types: Cigarettes    Quit date: 09/22/2013    Years since quitting: 6.0  . Smokeless tobacco: Never Used  Substance and Sexual Activity  . Alcohol use: Not Currently  . Drug use: Not Currently  . Sexual activity: Not on file  Other Topics Concern  . Not on file  Social History Narrative  . Not on file   Social Determinants of Health   Financial Resource Strain:   . Difficulty of Paying Living Expenses: Not on file  Food Insecurity:   . Worried About Charity fundraiser in the Last Year: Not on file  . Ran Out of Food in the Last Year: Not on file  Transportation Needs:   . Lack of Transportation (Medical): Not on file  . Lack of Transportation (Non-Medical): Not on file  Physical Activity:   . Days of Exercise per Week: Not on file  . Minutes of Exercise per Session: Not on file  Stress:   . Feeling of Stress : Not on file  Social Connections:   . Frequency of Communication with Friends and Family: Not on file  . Frequency of Social Gatherings with Friends and Family: Not on file   . Attends Religious Services: Not on file  . Active Member of Clubs or Organizations: Not on file  . Attends Archivist Meetings: Not on file  . Marital Status: Not on file     Family History: The patient's family history includes Lung cancer in his brother, father, mother, and sister.  ROS:   Please see the history of present illness.    All other systems reviewed and are negative.  EKGs/Labs/Other Studies Reviewed:    The following studies were reviewed today: I discussed the findings of my test with the patient at length   Recent Labs: 08/14/2019: ALT 20 08/15/2019: BUN 20; Creatinine, Ser  0.97; Hemoglobin 11.5; Platelets 197; Potassium 3.7; Sodium 135  Recent Lipid Panel No results found for: CHOL, TRIG, HDL, CHOLHDL, VLDL, LDLCALC, LDLDIRECT  Physical Exam:    VS:  BP 108/70   Pulse (!) 53   Ht 6' (1.829 m)   Wt 271 lb (122.9 kg)   SpO2 96%   BMI 36.75 kg/m     Wt Readings from Last 3 Encounters:  10/27/19 271 lb (122.9 kg)  08/30/19 270 lb (122.5 kg)  08/23/19 277 lb (125.6 kg)     GEN: Patient is in no acute distress HEENT: Normal NECK: No JVD; No carotid bruits LYMPHATICS: No lymphadenopathy CARDIAC: Hear sounds regular, 2/6 systolic murmur at the apex. RESPIRATORY:  Clear to auscultation without rales, wheezing or rhonchi  ABDOMEN: Soft, non-tender, non-distended MUSCULOSKELETAL:  No edema; No deformity  SKIN: Warm and dry NEUROLOGIC:  Alert and oriented x 3 PSYCHIATRIC:  Normal affect   Signed, Jenean Lindau, MD  10/27/2019 11:07 AM    [place]

## 2019-11-01 ENCOUNTER — Ambulatory Visit: Payer: 59 | Admitting: Thoracic Surgery (Cardiothoracic Vascular Surgery)

## 2019-11-14 ENCOUNTER — Other Ambulatory Visit: Payer: Self-pay | Admitting: Thoracic Surgery (Cardiothoracic Vascular Surgery)

## 2019-11-14 DIAGNOSIS — Z902 Acquired absence of lung [part of]: Secondary | ICD-10-CM

## 2019-11-15 ENCOUNTER — Other Ambulatory Visit: Payer: Self-pay

## 2019-11-15 ENCOUNTER — Ambulatory Visit (INDEPENDENT_AMBULATORY_CARE_PROVIDER_SITE_OTHER): Payer: Self-pay | Admitting: Thoracic Surgery (Cardiothoracic Vascular Surgery)

## 2019-11-15 ENCOUNTER — Ambulatory Visit
Admission: RE | Admit: 2019-11-15 | Discharge: 2019-11-15 | Disposition: A | Discharge status: Home / Self Care | Payer: 59 | Source: Ambulatory Visit | Attending: Thoracic Surgery (Cardiothoracic Vascular Surgery) | Admitting: Thoracic Surgery (Cardiothoracic Vascular Surgery)

## 2019-11-15 VITALS — BP 103/67 | HR 56 | Temp 97.9°F | Resp 20 | Ht 72.0 in | Wt 277.0 lb

## 2019-11-15 DIAGNOSIS — C3491 Malignant neoplasm of unspecified part of right bronchus or lung: Secondary | ICD-10-CM

## 2019-11-15 DIAGNOSIS — Z902 Acquired absence of lung [part of]: Secondary | ICD-10-CM

## 2019-11-15 NOTE — Progress Notes (Signed)
[address]       [place],Aaron [name]             [phone]     HPI: Aaron [name] returns for a scheduled follow-up visit  Aaron [name] is a 59 year old man with a remote history of tobacco abuse (quit 2015), COPD, obesity, hyperlipidemia, hypertension, and atrial fibrillation.  He was initially found to have an 8 mm right apical nodule on a low-dose screening CT.  That was followed in the nodule increased in size and was hypermetabolic.  I did a VATS right upper lobectomy on 08/12/2019.  Postoperative course was uncomplicated and he went home on day 4.  He is doing well.  He does get short of breath with activity.  He recently saw Dr. Geraldo Pitter his cardiologist.  He is not having any anginal pain.  He did see Dr.Lewis and has follow-up scheduled with him.  It only hurts when he sneezes, but it hurts a lot when he does so.  Past Medical History:  Diagnosis Date  . Anxiety   . Arrhythmia   . Complication of anesthesia   . Dysrhythmia    afib  . Mixed dyslipidemia   . Paroxysmal atrial fibrillation (HCC)   . PONV (postoperative nausea and vomiting)     Current Outpatient Medications  Medication Sig Dispense Refill  . atorvastatin (LIPITOR) 10 MG tablet Take 10 mg by mouth every evening.     Marland Kitchen ELIQUIS 5 MG TABS tablet Take 5 mg by mouth 2 (two) times daily.    Marland Kitchen escitalopram (LEXAPRO) 10 MG tablet Take 10 mg by mouth every evening.     . flecainide (TAMBOCOR) 150 MG tablet Take 1 tablet (150 mg total) by mouth 2 (two) times daily. 90 tablet 3  . metoprolol succinate (TOPROL-XL) 50 MG 24 hr tablet TAKE 1/2 TABLET BY MOUTH EVERY MORNING and TAKE ONE TABLET BY MOUTH at night with or immediately following a meal 30 tablet 4  . temazepam (RESTORIL) 15 MG capsule Take 15 mg by mouth at bedtime.     . traMADol (ULTRAM) 50 MG tablet Take 1 tablet (50 mg total) by mouth every 6 (six) hours as needed (mild pain). 30 tablet 0   No current facility-administered medications  for this visit.    Physical Exam BP 103/67   Pulse (!) 56   Temp 97.9 F (36.6 C)   Resp 20   Ht 6' (1.829 m)   Wt 277 lb (125.6 kg)   SpO2 92% Comment: RA  BMI 37.2 kg/m  59 year old man in no acute distress Alert and oriented x3 with no focal deficits Cardiac regular rate and rhythm Lungs diminished at right base but otherwise clear Incisions well-healed  Diagnostic Tests: Chest x-ray has not yet been officially read.  I reviewed the films.  Is a normal postoperative appearance.  Impression: Aaron [name] is a 59 year old man with a history of tobacco abuse, COPD, atrial fibrillation, obesity, and dyslipidemia.  He was found to have a right upper lobe lung nodule on a low-dose screening CT.  I did a thoracoscopic right upper lobectomy in October 2020.  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  From a surgical standpoint he is done extremely well.  He is not having any incisional pain except when he sneezes.  He is not taking any narcotics.  He saw Dr. Bobby Rumpf and will follow up with him in [place].  He does  have some shortness of breath.  I suspect this is primarily combination of pulmonary resection and his weight.  Dr. Geraldo Pitter is seeing him regarding his cardiac status.  Plan: Follow-up as planned with Dr. Bobby Rumpf.  I will be happy to see Aaron [name] back anytime if I can be of any further assistance with his care  Melrose Nakayama, MD [facility] [phone]

## 2019-11-24 ENCOUNTER — Ambulatory Visit (INDEPENDENT_AMBULATORY_CARE_PROVIDER_SITE_OTHER): Payer: 59 | Admitting: Cardiology

## 2019-11-24 ENCOUNTER — Encounter: Payer: Self-pay | Admitting: Cardiology

## 2019-11-24 ENCOUNTER — Other Ambulatory Visit: Payer: Self-pay

## 2019-11-24 VITALS — BP 104/68 | HR 60 | Ht 72.0 in | Wt 278.0 lb

## 2019-11-24 DIAGNOSIS — I480 Paroxysmal atrial fibrillation: Secondary | ICD-10-CM | POA: Diagnosis not present

## 2019-11-24 DIAGNOSIS — Z902 Acquired absence of lung [part of]: Secondary | ICD-10-CM

## 2019-11-24 DIAGNOSIS — E782 Mixed hyperlipidemia: Secondary | ICD-10-CM | POA: Diagnosis not present

## 2019-11-24 DIAGNOSIS — I208 Other forms of angina pectoris: Secondary | ICD-10-CM | POA: Diagnosis not present

## 2019-11-24 NOTE — Patient Instructions (Signed)
Medication Instructions:  No medication changes *If you need a refill on your cardiac medications before your next appointment, please call your pharmacy*   Lab Work: None ordered If you have labs (blood work) drawn today and your tests are completely normal, you will receive your results only by: Marland Kitchen MyChart Message (if you have MyChart) OR . A paper copy in the mail If you have any lab test that is abnormal or we need to change your treatment, we will call you to review the results.   Testing/Procedures: Your physician has requested that you have a lexiscan myoview. For further information please visit [email or web address]. Please follow instruction sheet, as given.    Follow-Up: At [facility], you and your health needs are our priority.  As part of our continuing mission to provide you with exceptional heart care, we have created designated Provider Care Teams.  These Care Teams include your primary Cardiologist (physician) and Advanced Practice Providers (APPs -  Physician Assistants and Nurse Practitioners) who all work together to provide you with the care you need, when you need it.  We recommend signing up for the patient portal called "MyChart".  Sign up information is provided on this After Visit Summary.  MyChart is used to connect with patients for Virtual Visits (Telemedicine).  Patients are able to view lab/test results, encounter notes, upcoming appointments, etc.  Non-urgent messages can be sent to your provider as well.   To learn more about what you can do with MyChart, go to [email or web address].    Your next appointment:   3 month(s)  The format for your next appointment:   In Person  Provider:   Jyl Heinz, MD   Other Instructions NA

## 2019-11-24 NOTE — Progress Notes (Signed)
Cardiology Office Note:    Date:  11/24/2019   ID:  Aaron [name], DOB 08/10/1961, MRN [id number]  PCP:  Lowella Dandy, NP  Cardiologist:  Jenean Lindau, MD   Referring MD: Lowella Dandy, NP    ASSESSMENT:    1. PAF (paroxysmal atrial fibrillation) ([place])   2. Mixed dyslipidemia   3. S/P lobectomy of lung   4. Anginal equivalent ([place])    PLAN:    In order of problems listed above:  1. Dyspnea on exertion: Primary prevention stressed with the patient.  Importance of compliance with diet and medication stressed and he vocalized understanding.  To evaluate this in view of his risk factors he will undergo Lexiscan sestamibi. 2. Essential hypertension: Blood pressure is stable 3. Paroxysmal atrial fibrillation:I discussed with the patient atrial fibrillation, disease process. Management and therapy including rate and rhythm control, anticoagulation benefits and potential risks were discussed extensively with the patient. Patient had multiple questions which were answered to patient's satisfaction. 4. Mixed dyslipidemia: Diet was advised weight reduction was stressed and he promises to comply.  Again, blood work was recently done by primary care physician. 5. Patient will be seen in follow-up appointment in 6 months or earlier if the patient has any concerns    Medication Adjustments/Labs and Tests Ordered: Current medicines are reviewed at length with the patient today.  Concerns regarding medicines are outlined above.  No orders of the defined types were placed in this encounter.  No orders of the defined types were placed in this encounter.    Chief Complaint  Patient presents with  . Follow-up    1 Month     History of Present Illness:    Aaron [name] is a 59 y.o. male.  Patient has past medical history of essential hypertension and proximal atrial fibrillation.  He has COPD but has quit smoking.  He denies any chest pain orthopnea or PND.  He had some dyspnea on  exertion which is considered to be an anginal equivalent.  His insurance company has denied CT coronary angiography.  No chest pain orthopnea or PND.  At the time of my evaluation, the patient is alert awake oriented and in no distress.  He mentions to me that [name] wanted his blood work recently and he was told it was fine.  Past Medical History:  Diagnosis Date  . Anginal equivalent ([place]) 10/27/2019  . Anxiety   . Arrhythmia   . Complication of anesthesia   . COPD  GOLD 0 = CT criteria only  01/26/2019   Quit smoking 2015 at wt = 170 lb  - 01/25/2019   Walked RA  2 laps @  approx 29ft each @ fast pace  stopped due to  [place] at wt = 276  - PFT's  07/01/2019  FEV1 3.16 (81 % ) ratio 0.78  p 7 % improvement from saba p nothing prior to study with DLCO wnl and f/v curve ok,  ERV 2% c/w effects of body habitus  . Dysrhythmia    afib  . Insomnia 02/16/2019  . Mixed dyslipidemia   . PAF (paroxysmal atrial fibrillation) ([place]) 07/27/2019  . Paroxysmal atrial fibrillation (HCC)   . PONV (postoperative nausea and vomiting)   . S/P lobectomy of lung 08/12/2019  . Solitary pulmonary nodule on lung CT 01/26/2019    LDSCT 11/17/18 that did show 8.7 mm RUL nodule and centrilobular emphysema / lingular tree-in-bud  - repeat CT  05/19/19 increase RUL  nodule to 15.6 mm  - PET 06/29/2019  The right upper lobe nodules highly hypermetabolic with maximum SUV of 11.2, most compatible with malignancy. No adenopathy or metastatic spread identified. 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 BACK SURGERY    . CARDIOVERSION N/A 07/15/2019   Procedure: CARDIOVERSION;  Surgeon: Pixie Casino, MD;  Location: [facility] ENDOSCOPY;  Service: Cardiovascular;  Laterality: N/A;  . CHOLECYSTECTOMY    . TEE WITH CARDIOVERSION    . TEE WITHOUT CARDIOVERSION N/A 07/15/2019   Procedure: TRANSESOPHAGEAL ECHOCARDIOGRAM (TEE);  Surgeon: Pixie Casino, MD;   Location: [place];  Service: Cardiovascular;  Laterality: N/A;  . VIDEO ASSISTED THORACOSCOPY (VATS)/ LOBECTOMY  08/12/2019   Procedure: RIGHT Video Assisted Thoracoscopy (Vats) With Right Upper Lobectomy;  Surgeon: Melrose Nakayama, MD;  Location: [facility] OR;  Service: Thoracic;;    Current Medications: Current Meds  Medication Sig  . atorvastatin (LIPITOR) 10 MG tablet Take 10 mg by mouth every evening.   Marland Kitchen ELIQUIS 5 MG TABS tablet Take 5 mg by mouth 2 (two) times daily.  Marland Kitchen escitalopram (LEXAPRO) 10 MG tablet Take 10 mg by mouth every evening.   . flecainide (TAMBOCOR) 150 MG tablet Take 1 tablet (150 mg total) by mouth 2 (two) times daily.  . metoprolol succinate (TOPROL-XL) 50 MG 24 hr tablet TAKE 1/2 TABLET BY MOUTH EVERY MORNING and TAKE ONE TABLET BY MOUTH at night with or immediately following a meal  . temazepam (RESTORIL) 15 MG capsule Take 15 mg by mouth at bedtime.   . traMADol (ULTRAM) 50 MG tablet Take 1 tablet (50 mg total) by mouth every 6 (six) hours as needed (mild pain).     Allergies:   Penicillins and Codeine   Social History   Socioeconomic History  . Marital status: Married    Spouse name: Not on file  . Number of children: Not on file  . Years of education: Not on file  . Highest education level: Not on file  Occupational History  . Not on file  Tobacco Use  . Smoking status: Former Smoker    Packs/day: 1.50    Years: 40.00    Pack years: 60.00    Types: Cigarettes    Quit date: 09/22/2013    Years since quitting: 6.1  . Smokeless tobacco: Never Used  Substance and Sexual Activity  . Alcohol use: Not Currently  . Drug use: Not Currently  . Sexual activity: Not on file  Other Topics Concern  . Not on file  Social History Narrative  . Not on file   Social Determinants of Health   Financial Resource Strain:   . Difficulty of Paying Living Expenses: Not on file  Food Insecurity:   . Worried About Charity fundraiser in the Last Year: Not on  file  . Ran Out of Food in the Last Year: Not on file  Transportation Needs:   . Lack of Transportation (Medical): Not on file  . Lack of Transportation (Non-Medical): Not on file  Physical Activity:   . Days of Exercise per Week: Not on file  . Minutes of Exercise per Session: Not on file  Stress:   . Feeling of Stress : Not on file  Social Connections:   . Frequency of Communication with Friends and Family: Not on file  . Frequency of Social Gatherings with Friends and Family: Not on file  .  Attends Religious Services: Not on file  . Active Member of Clubs or Organizations: Not on file  . Attends Archivist Meetings: Not on file  . Marital Status: Not on file     Family History: The patient's family history includes Lung cancer in his brother, father, mother, and sister.  ROS:   Please see the history of present illness.    All other systems reviewed and are negative.  EKGs/Labs/Other Studies Reviewed:    The following studies were reviewed today: EKG reveals sinus bradycardia and nonspecific ST-T changes.   Recent Labs: 08/14/2019: ALT 20 08/15/2019: BUN 20; Creatinine, Ser 0.97; Hemoglobin 11.5; Platelets 197; Potassium 3.7; Sodium 135  Recent Lipid Panel No results found for: CHOL, TRIG, HDL, CHOLHDL, VLDL, LDLCALC, LDLDIRECT  Physical Exam:    VS:  BP 104/68   Pulse 60   Ht 6' (1.829 m)   Wt 278 lb (126.1 kg)   SpO2 96%   BMI 37.70 kg/m     Wt Readings from Last 3 Encounters:  11/24/19 278 lb (126.1 kg)  11/15/19 277 lb (125.6 kg)  10/27/19 271 lb (122.9 kg)     GEN: Patient is in no acute distress HEENT: Normal NECK: No JVD; No carotid bruits LYMPHATICS: No lymphadenopathy CARDIAC: Hear sounds regular, 2/6 systolic murmur at the apex. RESPIRATORY:  Clear to auscultation without rales, wheezing or rhonchi  ABDOMEN: Soft, non-tender, non-distended MUSCULOSKELETAL:  No edema; No deformity  SKIN: Warm and dry NEUROLOGIC:  Alert and oriented  x 3 PSYCHIATRIC:  Normal affect   Signed, Jenean Lindau, MD  11/24/2019 8:44 AM    [place]

## 2019-11-25 ENCOUNTER — Other Ambulatory Visit: Payer: Self-pay | Admitting: Cardiology

## 2019-12-14 ENCOUNTER — Telehealth (HOSPITAL_COMMUNITY): Payer: Self-pay | Admitting: *Deleted

## 2019-12-14 NOTE — Telephone Encounter (Signed)
Patient given detailed instructions per Myocardial Perfusion Study Information Sheet for the test on 12/21/19 at 11:30. Patient notified to arrive 15 minutes early and that it is imperative to arrive on time for appointment to keep from having the test rescheduled.  If you need to cancel or reschedule your appointment, please call the office within 24 hours of your appointment. . Patient verbalized understanding.Veronia Beets

## 2019-12-21 ENCOUNTER — Ambulatory Visit: Payer: 59

## 2019-12-22 ENCOUNTER — Ambulatory Visit: Payer: 59

## 2020-01-01 ENCOUNTER — Other Ambulatory Visit: Payer: Self-pay | Admitting: Cardiology

## 2020-01-03 ENCOUNTER — Telehealth (HOSPITAL_COMMUNITY): Payer: Self-pay | Admitting: *Deleted

## 2020-01-03 NOTE — Telephone Encounter (Signed)
Patient given detailed instructions per Myocardial Perfusion Study Information Sheet for the test on 01/10/20 at 7:45. Patient notified to arrive 15 minutes early and that it is imperative to arrive on time for appointment to keep from having the test rescheduled.  If you need to cancel or reschedule your appointment, please call the office within 24 hours of your appointment. . Patient verbalized understanding.Aaron [name]

## 2020-01-05 IMAGING — CT NM PET TUM IMG INITIAL (PI) SKULL BASE T - THIGH
1 of 8 series · 1 of 25 positions shown · non-contrast
Comparison: CT chest 05/19/2019

CLINICAL DATA: Initial treatment strategy for lung nodule.

EXAM:
NUCLEAR MEDICINE PET SKULL BASE TO THIGH
TECHNIQUE: 14.3 mCi F-18 FDG was injected intravenously. Full-ring PET imaging
was performed from the skull base to thigh after the radiotracer. CT
data was obtained and used for attenuation correction and anatomic
localization.
Fasting blood glucose: 78 mg/dl

[Series 4: ct sk_thigh 5.0 hd_fov · axial · 5.0mm · 1.17mm/px · 1 of 254 slices shown]
[im 254/254  brain]
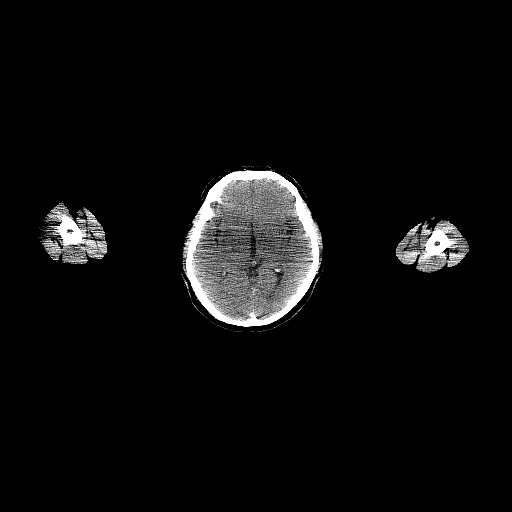

[1 of 25 positions shown; findings below may reference images not displayed]

FINDINGS: Mediastinal blood pool activity: SUV max

Liver activity: SUV max NA

NECK: Physiologic activity along the palatine and lingual tonsillar
tissues, relatively symmetric. No hypermetabolic or pathologically
enlarged adenopathy in the neck.

Incidental CT findings: none

CHEST: The [DATE] by 1.1 cm right upper lobe pulmonary nodule has
maximum SUV of 11.2, highly suspicious for malignancy. No
hypermetabolic or pathologic adenopathy identified.

Incidental CT findings: Stable emphysema. Left anterior descending
coronary artery atherosclerosis and right coronary artery
atherosclerosis. Mild cardiomegaly.

ABDOMEN/PELVIS: No significant abnormal hypermetabolic activity in
this region.

Incidental CT findings: Cholecystectomy. Aortoiliac atherosclerotic
vascular disease.

SKELETON: No significant abnormal hypermetabolic activity in this
region.

Incidental CT findings: none
IMPRESSION: 1. The right upper lobe nodules highly hypermetabolic with maximum
SUV of 11.2, most compatible with malignancy. No adenopathy or
metastatic spread identified.
2. Aortic Atherosclerosis (LFVFZ-XND.D) and Emphysema (LFVFZ-H41.8).
Coronary atherosclerosis.

## 2020-01-09 DIAGNOSIS — C3411 Malignant neoplasm of upper lobe, right bronchus or lung: Secondary | ICD-10-CM | POA: Diagnosis not present

## 2020-01-10 ENCOUNTER — Ambulatory Visit: Payer: 59

## 2020-01-11 ENCOUNTER — Ambulatory Visit: Payer: 59

## 2020-01-11 ENCOUNTER — Telehealth: Payer: Self-pay | Admitting: Cardiology

## 2020-01-11 NOTE — Telephone Encounter (Signed)
New Message  HR Manager Chrsi is calling in with the patient. Patient has given permission to speak with him on his behalf. States that patient has been denied by insurance to have the CT and the Stress test done. States that the denial letters are needed. Please assist.

## 2020-01-19 NOTE — Telephone Encounter (Signed)
Is this something that this group can assist with?? Thank you, Addley Ballinger

## 2020-02-18 IMAGING — DX DG CHEST 1V PORT
1 series · 1 of 1 positions shown · non-contrast
Comparison: 08/10/2019

CLINICAL DATA: Status post lobectomy

EXAM:
PORTABLE CHEST 1 VIEW

[chest ap]
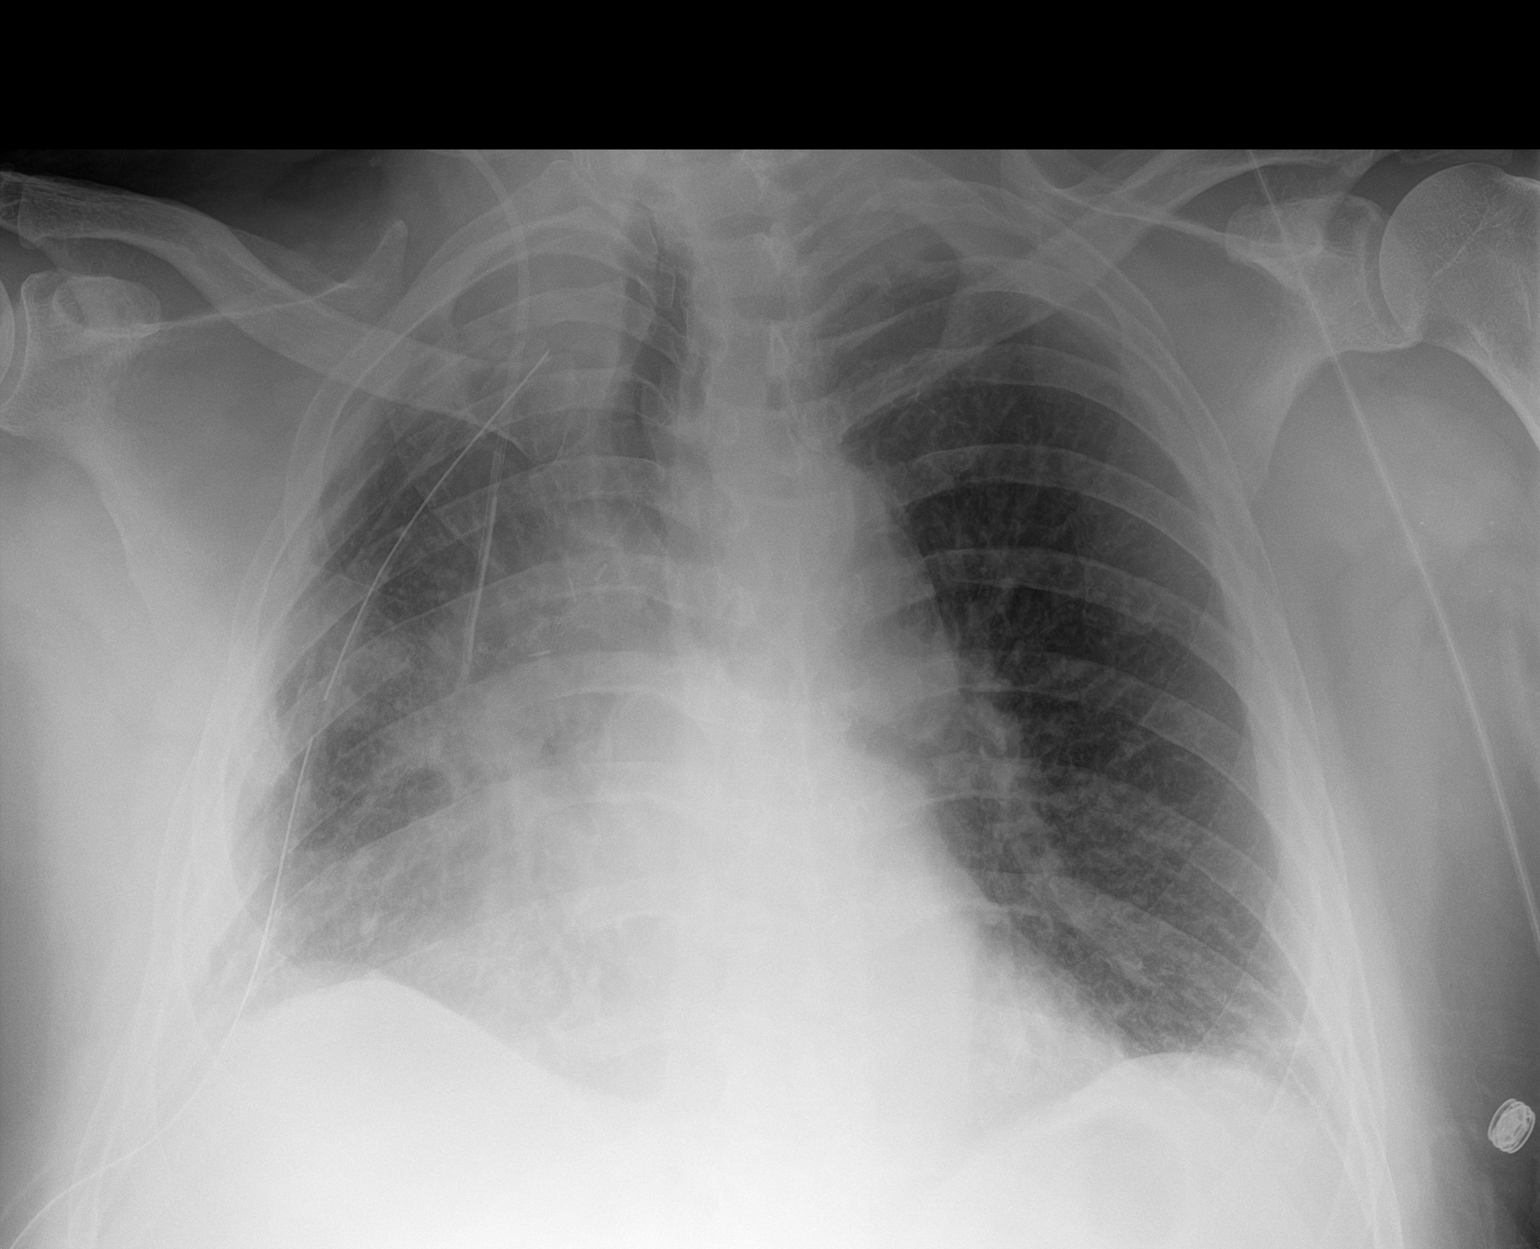

[1 of 1 positions shown; findings below may reference images not displayed]

FINDINGS: Interval right upper lobectomy. Right lung volume loss. Right-sided
chest tube directed towards the apex. Small right pleural effusion.
No pneumothorax. Left lung is clear. Stable cardiomediastinal
silhouette. No aggressive osseous lesion.

Right jugular central venous catheter with the tip projecting over
the SVC.
IMPRESSION: 1. Interval right upper lobectomy. Right-sided chest tube in
satisfactory position. No pneumothorax. Trace right pleural effusion
and atelectasis.

## 2020-02-20 IMAGING — DX DG CHEST 1V PORT
1 series · 1 of 1 positions shown · non-contrast
Comparison: Chest radiograph from one day prior.

CLINICAL DATA: Status post right upper lobectomy

EXAM:
PORTABLE CHEST 1 VIEW

[chest]
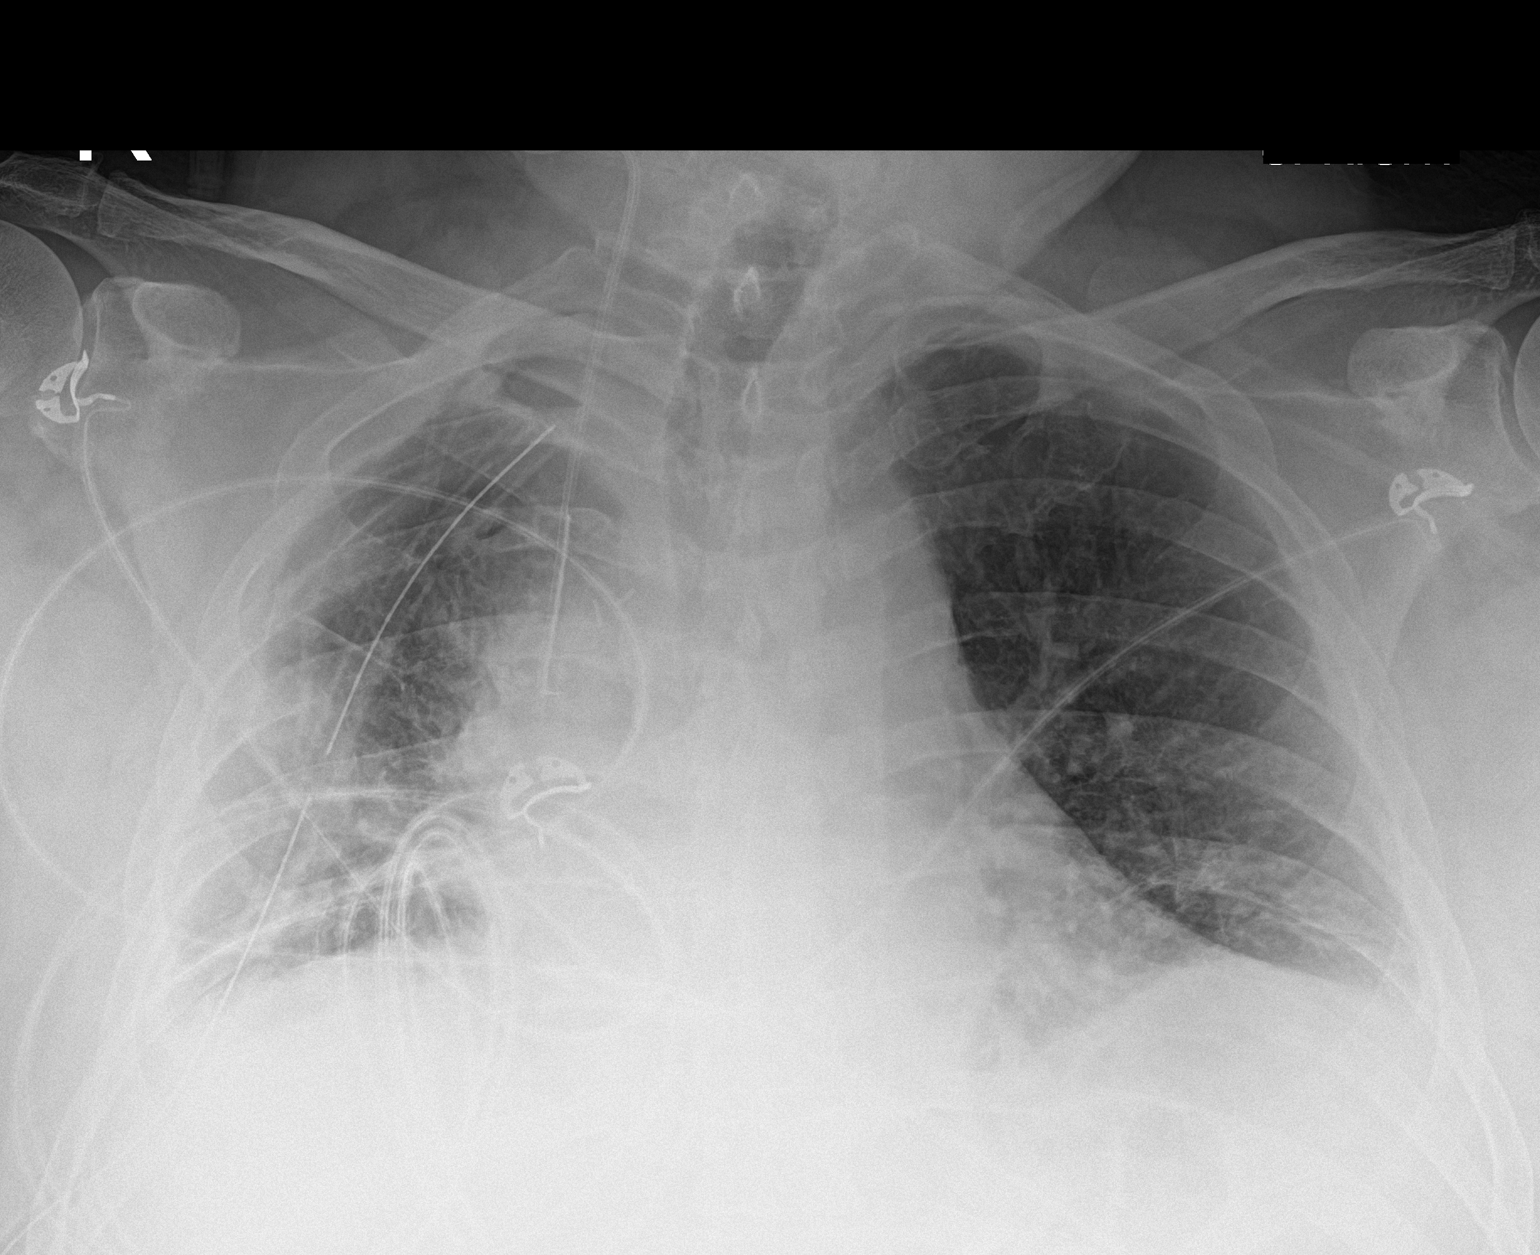

[1 of 1 positions shown; findings below may reference images not displayed]

FINDINGS: Stable right apical chest tube and right internal jugular central
venous catheter with tip in the lower third of the SVC. Stable
cardiomediastinal silhouette with normal heart size. Small right
apical pneumothorax is not appreciably changed. No left
pneumothorax. No pleural effusion. Stable hazy parahilar right lung
opacities. Minimal left basilar atelectasis, stable.
IMPRESSION: 1. No appreciable change in small right apical pneumothorax. Right
apical chest tube in place.
2. Stable hazy parahilar right lung opacities, favor atelectasis.
3. Stable minimal left basilar atelectasis.

## 2020-02-24 ENCOUNTER — Other Ambulatory Visit: Payer: Self-pay | Admitting: Cardiology

## 2020-02-28 ENCOUNTER — Ambulatory Visit: Payer: 59 | Admitting: Cardiology

## 2020-02-28 ENCOUNTER — Other Ambulatory Visit: Payer: Self-pay

## 2020-02-28 ENCOUNTER — Encounter: Payer: Self-pay | Admitting: Cardiology

## 2020-02-28 VITALS — BP 118/80 | HR 55 | Ht 72.0 in | Wt 282.4 lb

## 2020-02-28 DIAGNOSIS — Z902 Acquired absence of lung [part of]: Secondary | ICD-10-CM | POA: Diagnosis not present

## 2020-02-28 DIAGNOSIS — J449 Chronic obstructive pulmonary disease, unspecified: Secondary | ICD-10-CM | POA: Diagnosis not present

## 2020-02-28 DIAGNOSIS — E782 Mixed hyperlipidemia: Secondary | ICD-10-CM

## 2020-02-28 DIAGNOSIS — I480 Paroxysmal atrial fibrillation: Secondary | ICD-10-CM | POA: Diagnosis not present

## 2020-02-28 NOTE — Patient Instructions (Signed)

## 2020-02-28 NOTE — Progress Notes (Signed)
Cardiology Office Note:    Date:  02/28/2020   ID:  [name], DOB 1961-04-04, MRN [id number]  PCP:  Lowella Dandy, NP  Cardiologist:  Jenean Lindau, MD   Referring MD: Lowella Dandy, NP    ASSESSMENT:    1. PAF (paroxysmal atrial fibrillation) ([place])   2. COPD  GOLD 0 = CT criteria only    3. Mixed dyslipidemia   4. S/P lobectomy of lung    PLAN:    In order of problems listed above:  1. Primary prevention stressed with the patient.  Importance of compliance with diet medication stressed and he vocalized understanding. 2. Essential hypertension: Blood pressure stable and diet was emphasized also to lose weight especially abdominal obesity 3. Mixed dyslipidemia: Managed by primary care physician and lipids were fine and checked recently 4. Paroxysmal atrial fibrillation:I discussed with the patient atrial fibrillation, disease process. Management and therapy including rate and rhythm control, anticoagulation benefits and potential risks were discussed extensively with the patient. Patient had multiple questions which were answered to patient's satisfaction. 5. Patient will be seen in follow-up appointment in 4 months or earlier if the patient has any concerns    Medication Adjustments/Labs and Tests Ordered: Current medicines are reviewed at length with the patient today.  Concerns regarding medicines are outlined above.  No orders of the defined types were placed in this encounter.  No orders of the defined types were placed in this encounter.    Chief Complaint  Patient presents with  . Follow-up    3 MO FU      History of Present Illness:    Aaron [name] is a 59 y.o. male.  Patient has past medical history of paroxysmal atrial fibrillation, mixed dyslipidemia.  He denies any problems at this time and takes care of activities of daily living.  No chest pain orthopnea or PND.  At the time of my evaluation, the patient is alert awake oriented and in no  distress.  He is tolerating flecainide well.  Past Medical History:  Diagnosis Date  . Anginal equivalent ([place]) 10/27/2019  . Anxiety   . Arrhythmia   . Complication of anesthesia   . COPD  GOLD 0 = CT criteria only  01/26/2019   Quit smoking 2015 at wt = 170 lb  - 01/25/2019   Walked RA  2 laps @  approx 263ft each @ fast pace  stopped due to  [place] at wt = 276  - PFT's  07/01/2019  FEV1 3.16 (81 % ) ratio 0.78  p 7 % improvement from saba p nothing prior to study with DLCO wnl and f/v curve ok,  ERV 2% c/w effects of body habitus  . Dysrhythmia    afib  . Insomnia 02/16/2019  . Mixed dyslipidemia   . PAF (paroxysmal atrial fibrillation) ([place]) 07/27/2019  . Paroxysmal atrial fibrillation ([facility])   . PONV (postoperative nausea and vomiting)   . S/P lobectomy of lung 08/12/2019  . Solitary pulmonary nodule on lung CT 01/26/2019    LDSCT 11/17/18 that did show 8.7 mm RUL nodule and centrilobular emphysema / lingular tree-in-bud  - repeat CT  05/19/19 increase RUL nodule to 15.6 mm  - PET 06/29/2019  The right upper lobe nodules highly hypermetabolic with maximum SUV of 11.2, most compatible with malignancy. No adenopathy or metastatic spread identified. 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 BACK SURGERY    . CARDIOVERSION N/A 07/15/2019   Procedure: CARDIOVERSION;  Surgeon: Pixie Casino, MD;  Location: [facility] ENDOSCOPY;  Service: Cardiovascular;  Laterality: N/A;  . CHOLECYSTECTOMY    . TEE WITH CARDIOVERSION    . TEE WITHOUT CARDIOVERSION N/A 07/15/2019   Procedure: TRANSESOPHAGEAL ECHOCARDIOGRAM (TEE);  Surgeon: Pixie Casino, MD;  Location: [place];  Service: Cardiovascular;  Laterality: N/A;  . VIDEO ASSISTED THORACOSCOPY (VATS)/ LOBECTOMY  08/12/2019   Procedure: RIGHT Video Assisted Thoracoscopy (Vats) With Right Upper Lobectomy;  Surgeon: Melrose Nakayama, MD;  Location: [facility] OR;  Service:  Thoracic;;    Current Medications: Current Meds  Medication Sig  . atorvastatin (LIPITOR) 10 MG tablet Take 10 mg by mouth every evening.   Marland Kitchen ELIQUIS 5 MG TABS tablet Take 5 mg by mouth 2 (two) times daily.  Marland Kitchen escitalopram (LEXAPRO) 10 MG tablet Take 10 mg by mouth every evening.   . flecainide (TAMBOCOR) 150 MG tablet Take 1 tablet (150 mg total) by mouth 2 (two) times daily.  . metoprolol succinate (TOPROL-XL) 50 MG 24 hr tablet TAKE 1/2 TABLET BY MOUTH EVERY MORNING and TAKE ONE TABLET BY MOUTH at night with or immediately following a meal  . temazepam (RESTORIL) 15 MG capsule Take 15 mg by mouth at bedtime.   . TRELEGY ELLIPTA 100-62.5-25 MCG/INH AEPB Inhale 1 puff into the lungs daily.     Allergies:   Penicillins and Codeine   Social History   Socioeconomic History  . Marital status: Married    Spouse name: Not on file  . Number of children: Not on file  . Years of education: Not on file  . Highest education level: Not on file  Occupational History  . Not on file  Tobacco Use  . Smoking status: Former Smoker    Packs/day: 1.50    Years: 40.00    Pack years: 60.00    Types: Cigarettes    Quit date: 09/22/2013    Years since quitting: 6.4  . Smokeless tobacco: Never Used  Substance and Sexual Activity  . Alcohol use: Not Currently  . Drug use: Not Currently  . Sexual activity: Not on file  Other Topics Concern  . Not on file  Social History Narrative  . Not on file   Social Determinants of Health   Financial Resource Strain:   . Difficulty of Paying Living Expenses:   Food Insecurity:   . Worried About Charity fundraiser in the Last Year:   . Arboriculturist in the Last Year:   Transportation Needs:   . Film/video editor (Medical):   Marland Kitchen Lack of Transportation (Non-Medical):   Physical Activity:   . Days of Exercise per Week:   . Minutes of Exercise per Session:   Stress:   . Feeling of Stress :   Social Connections:   . Frequency of Communication  with Friends and Family:   . Frequency of Social Gatherings with Friends and Family:   . Attends Religious Services:   . Active Member of Clubs or Organizations:   . Attends Archivist Meetings:   Marland Kitchen Marital Status:      Family History: The patient's family history includes Lung cancer in his brother, father, mother, and sister.  ROS:   Please see the history of present illness.    All other systems reviewed and are negative.  EKGs/Labs/Other Studies Reviewed:    The following studies were  reviewed today: EKG reveals sinus rhythm and nonspecific ST-T changes and QRS was within normal limits   Recent Labs: 08/14/2019: ALT 20 08/15/2019: BUN 20; Creatinine, Ser 0.97; Hemoglobin 11.5; Platelets 197; Potassium 3.7; Sodium 135  Recent Lipid Panel No results found for: CHOL, TRIG, HDL, CHOLHDL, VLDL, LDLCALC, LDLDIRECT  Physical Exam:    VS:  BP 118/80   Pulse (!) 55   Ht 6' (1.829 m)   Wt 282 lb 6.4 oz (128.1 kg)   SpO2 94%   BMI 38.30 kg/m     Wt Readings from Last 3 Encounters:  02/28/20 282 lb 6.4 oz (128.1 kg)  11/24/19 278 lb (126.1 kg)  11/15/19 277 lb (125.6 kg)     GEN: Patient is in no acute distress HEENT: Normal NECK: No JVD; No carotid bruits LYMPHATICS: No lymphadenopathy CARDIAC: Hear sounds regular, 2/6 systolic murmur at the apex. RESPIRATORY:  Clear to auscultation without rales, wheezing or rhonchi  ABDOMEN: Soft, non-tender, non-distended MUSCULOSKELETAL:  No edema; No deformity  SKIN: Warm and dry NEUROLOGIC:  Alert and oriented x 3 PSYCHIATRIC:  Normal affect   Signed, Jenean Lindau, MD  02/28/2020 11:47 AM    [place]

## 2020-02-29 IMAGING — CR DG CHEST 2V
2 series · 2 of 2 positions shown · non-contrast
Comparison: 08/16/2019

CLINICAL DATA: Follow-up right sided VATS procedure 08/12/2019

EXAM:
CHEST - 2 VIEW

[w chest pa]
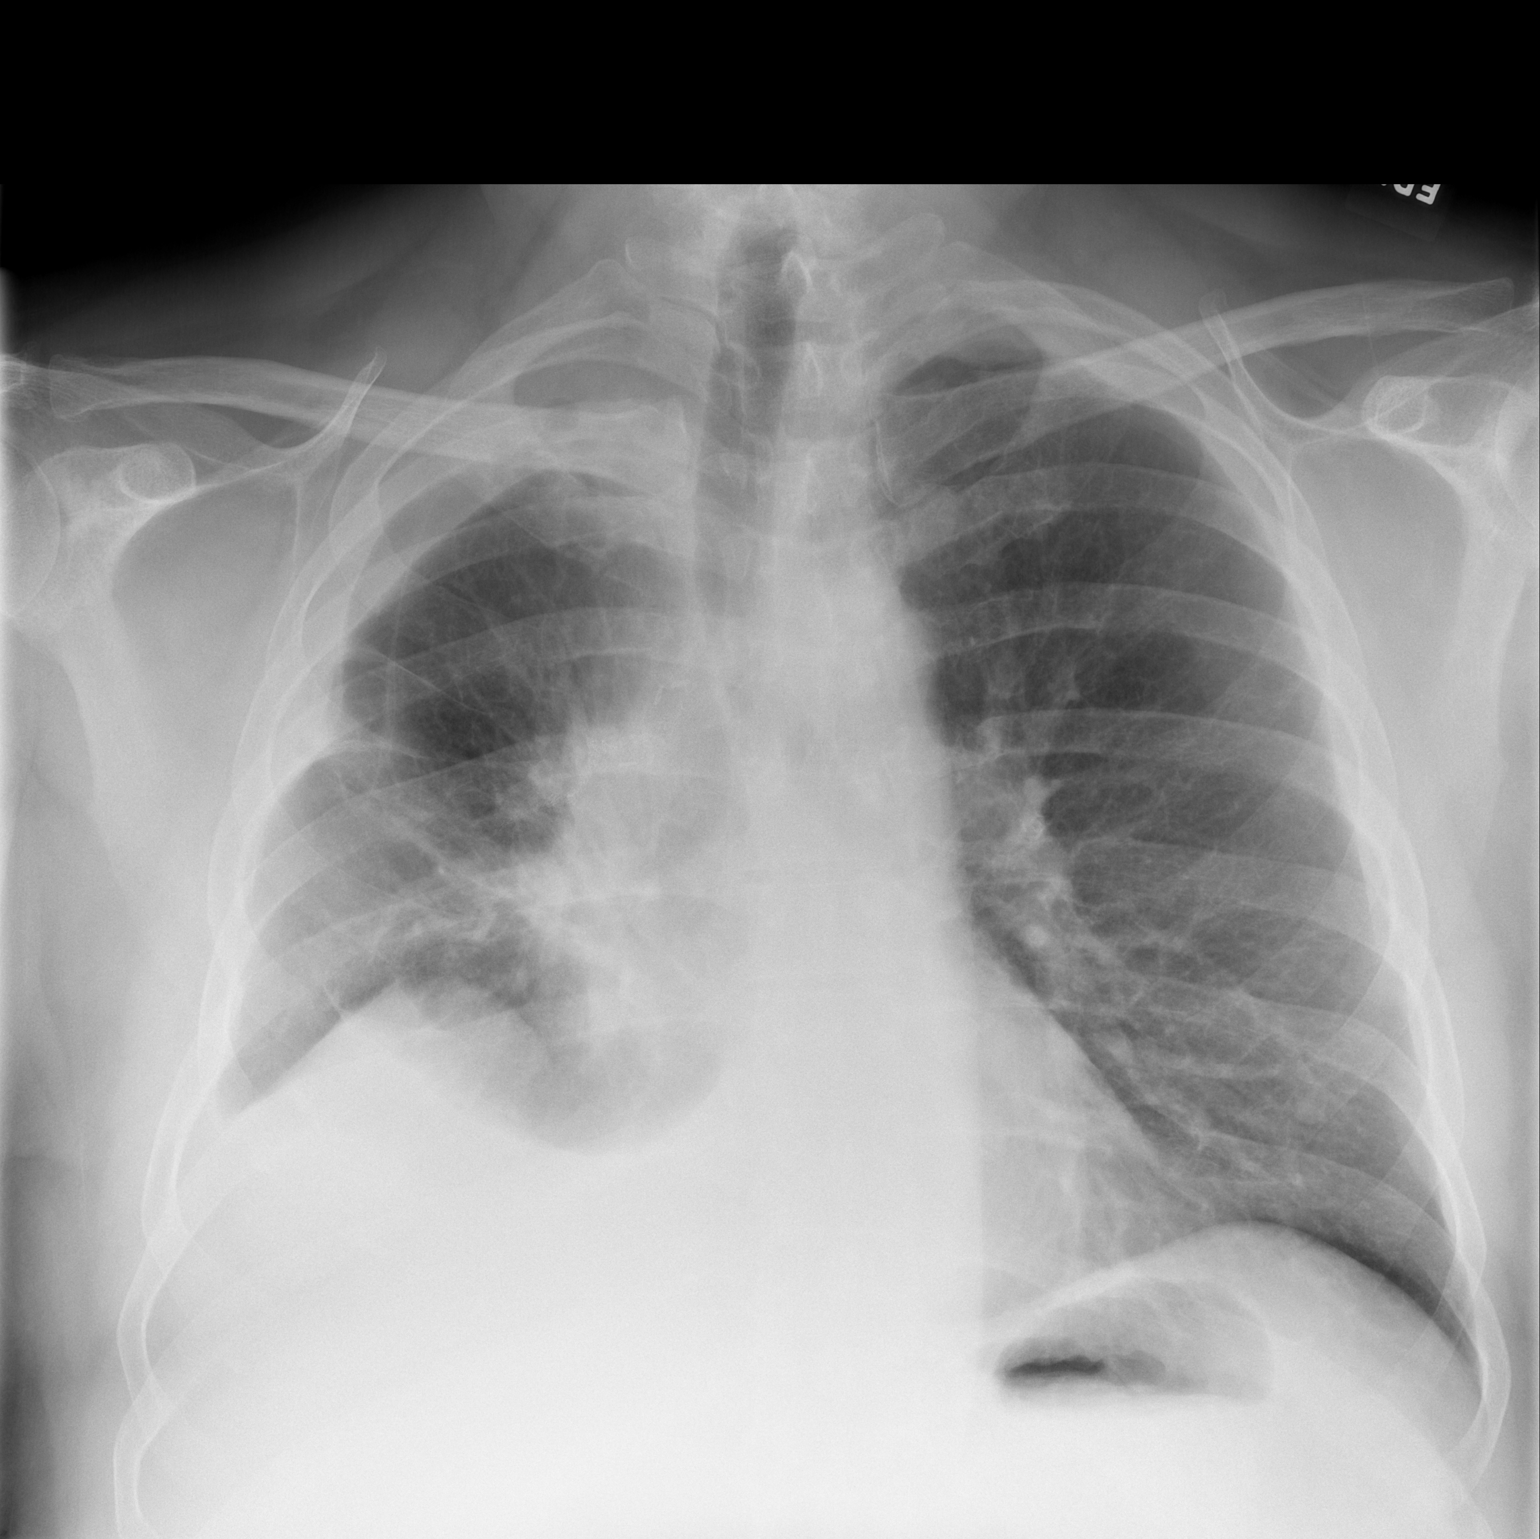

[w chest lat]
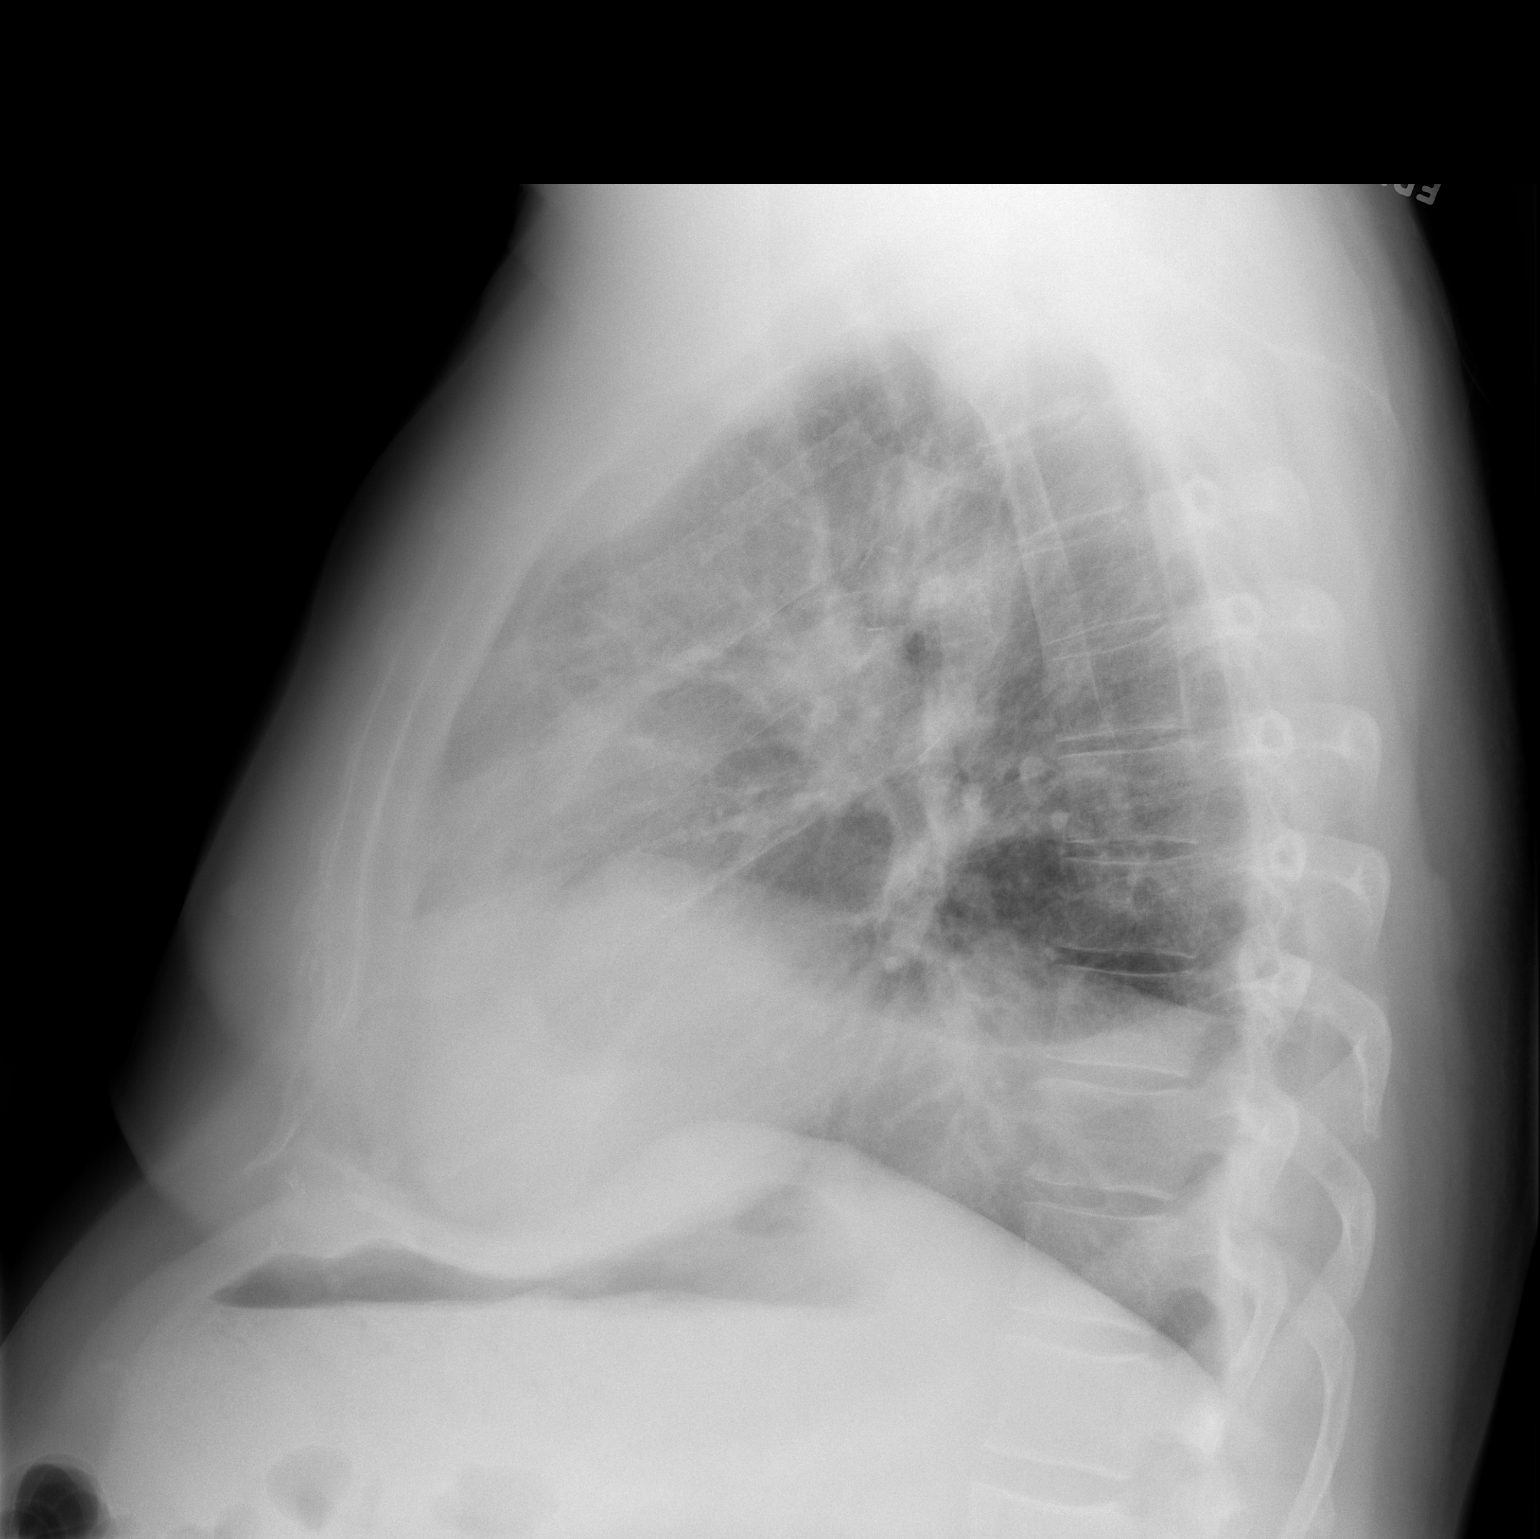

[2 of 2 positions shown; findings below may reference images not displayed]

FINDINGS: Postsurgical changes from right upper lobectomy with perihilar
atelectasis. Small right pleural effusion with loculated fluid along
the apex with a small bubble of air in the right apical pleural
fluid. Right lung volume loss. Left lung is clear. No left
pneumothorax. Stable cardiomediastinal silhouette. No aggressive
osseous lesion.
IMPRESSION: Postsurgical changes from right upper lobectomy with perihilar
atelectasis. Small right pleural effusion with loculated fluid along
the apex with a small bubble of air in the right apical pleural
fluid. Right lung volume loss.

## 2020-03-27 ENCOUNTER — Other Ambulatory Visit: Payer: Self-pay | Admitting: Cardiology

## 2020-05-10 DIAGNOSIS — C3411 Malignant neoplasm of upper lobe, right bronchus or lung: Secondary | ICD-10-CM

## 2020-05-30 ENCOUNTER — Ambulatory Visit: Payer: 59 | Admitting: Cardiology

## 2020-05-30 ENCOUNTER — Other Ambulatory Visit: Payer: Self-pay

## 2020-05-30 ENCOUNTER — Encounter: Payer: Self-pay | Admitting: Cardiology

## 2020-05-30 VITALS — BP 114/68 | HR 50 | Ht 72.0 in | Wt 292.8 lb

## 2020-05-30 DIAGNOSIS — E782 Mixed hyperlipidemia: Secondary | ICD-10-CM | POA: Diagnosis not present

## 2020-05-30 DIAGNOSIS — Z902 Acquired absence of lung [part of]: Secondary | ICD-10-CM

## 2020-05-30 DIAGNOSIS — I480 Paroxysmal atrial fibrillation: Secondary | ICD-10-CM

## 2020-05-30 DIAGNOSIS — I2510 Atherosclerotic heart disease of native coronary artery without angina pectoris: Secondary | ICD-10-CM

## 2020-05-30 DIAGNOSIS — J449 Chronic obstructive pulmonary disease, unspecified: Secondary | ICD-10-CM

## 2020-05-30 HISTORY — DX: Morbid (severe) obesity due to excess calories: E66.01

## 2020-05-30 HISTORY — DX: Atherosclerotic heart disease of native coronary artery without angina pectoris: I25.10

## 2020-05-30 NOTE — Patient Instructions (Signed)

## 2020-05-30 NOTE — Progress Notes (Signed)
Cardiology Office Note:    Date:  05/30/2020   ID:  Aaron [name], DOB [birth year], MRN [id number]  PCP:  Lowella Dandy, NP  Cardiologist:  Jenean Lindau, MD   Referring MD: Lowella Dandy, NP    ASSESSMENT:    1. PAF (paroxysmal atrial fibrillation) ([place])   2. Mixed dyslipidemia   3. S/P lobectomy of lung   4. Morbid obesity ([place])   5. COPD  GOLD 0 = CT criteria only     PLAN:    In order of problems listed above:  1. Secondary prevention stressed with the patient. Importance of compliance with diet medication stressed any vocalized understanding. 2. Coronary artery calcification: I discussed my findings with the patient at length. He has had a stress test last year. He is not keen on doing 1 at this time because of the pandemic and wants to postpone it by 6 months. He has stable dyspnea on exertion and no chest pain or any such issues. 3. Paroxysmal atrial fibrillation:I discussed with the patient atrial fibrillation, disease process. Management and therapy including rate and rhythm control, anticoagulation benefits and potential risks were discussed extensively with the patient. Patient had multiple questions which were answered to patient's satisfaction. 4. Mixed dyslipidemia: On lipid-lowering medication/statins. Managed by primary care physician. Labs from [facility] sheets were reviewed and they were fine. 5. Morbid obesity: Weight reduction was stressed and diet was emphasized and he promises to do better. 6. Patient will be seen in follow-up appointment in 6 months or earlier if the patient has any concerns    Medication Adjustments/Labs and Tests Ordered: Current medicines are reviewed at length with the patient today.  Concerns regarding medicines are outlined above.  Orders Placed This Encounter  Procedures  . EKG 12-Lead   No orders of the defined types were placed in this encounter.    No chief complaint on file.    History of Present Illness:    Aaron [name] is a 59 y.o. male. Patient has past medical history of paroxysmal atrial fibrillation, coronary artery calcification seen on CT scan, essential hypertension, mixed dyslipidemia and obesity. He has COPD and is post lobectomy. He denies any problems at this time takes care of activities of daily living. No chest pain orthopnea or PND. He has dyspnea on exertion which is chronic. At the time of my evaluation, the patient is alert awake oriented and in no distress.  Past Medical History:  Diagnosis Date  . Anginal equivalent ([place]) 10/27/2019  . Anxiety   . Arrhythmia   . Complication of anesthesia   . COPD  GOLD 0 = CT criteria only  01/26/2019   Quit smoking 2015 at wt = 170 lb  - 01/25/2019   Walked RA  2 laps @  approx 261ft each @ fast pace  stopped due to  [place] at wt = 276  - PFT's  07/01/2019  FEV1 3.16 (81 % ) ratio 0.78  p 7 % improvement from saba p nothing prior to study with DLCO wnl and f/v curve ok,  ERV 2% c/w effects of body habitus  . Dysrhythmia    afib  . Insomnia 02/16/2019  . Mixed dyslipidemia   . PAF (paroxysmal atrial fibrillation) ([place]) 07/27/2019  . Paroxysmal atrial fibrillation ([facility])   . PONV (postoperative nausea and vomiting)   . S/P lobectomy of lung 08/12/2019  . Solitary pulmonary nodule on lung CT 01/26/2019    LDSCT 11/17/18 that did  show 8.7 mm RUL nodule and centrilobular emphysema / lingular tree-in-bud  - repeat CT  05/19/19 increase RUL nodule to 15.6 mm  - PET 06/29/2019  The right upper lobe nodules highly hypermetabolic with maximum SUV of 11.2, most compatible with malignancy. No adenopathy or metastatic spread identified. 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 BACK SURGERY    . CARDIOVERSION N/A 07/15/2019   Procedure: CARDIOVERSION;  Surgeon: Pixie Casino, MD;  [facility] ENDOSCOPY;  Service: Cardiovascular;  Laterality: N/A;  . CHOLECYSTECTOMY    . TEE  WITH CARDIOVERSION    . TEE WITHOUT CARDIOVERSION N/A 07/15/2019   Procedure: TRANSESOPHAGEAL ECHOCARDIOGRAM (TEE);  Surgeon: Pixie Casino, MD;  Location: [place];  Service: Cardiovascular;  Laterality: N/A;  . VIDEO ASSISTED THORACOSCOPY (VATS)/ LOBECTOMY  08/12/2019   Procedure: RIGHT Video Assisted Thoracoscopy (Vats) With Right Upper Lobectomy;  Surgeon: Melrose Nakayama, MD;  Location: [facility] OR;  Service: Thoracic;;    Current Medications: Current Meds  Medication Sig  . atorvastatin (LIPITOR) 10 MG tablet Take 10 mg by mouth every evening.   Marland Kitchen ELIQUIS 5 MG TABS tablet Take 5 mg by mouth 2 (two) times daily.  Marland Kitchen escitalopram (LEXAPRO) 10 MG tablet Take 10 mg by mouth every evening.   . flecainide (TAMBOCOR) 150 MG tablet Take 1 tablet (150 mg total) by mouth 2 (two) times daily.  . metoprolol succinate (TOPROL-XL) 50 MG 24 hr tablet Take 1.5 tablets (75 mg total) by mouth daily. Take 1/2 tab in the AM, take 1 tab in the PM with a meal  . temazepam (RESTORIL) 15 MG capsule Take 15 mg by mouth at bedtime.   . TRELEGY ELLIPTA 100-62.5-25 MCG/INH AEPB Inhale 1 puff into the lungs daily.     Allergies:   Penicillins and Codeine   Social History   Socioeconomic History  . Marital status: Married    Spouse name: Not on file  . Number of children: Not on file  . Years of education: Not on file  . Highest education level: Not on file  Occupational History  . Not on file  Tobacco Use  . Smoking status: Former Smoker    Packs/day: 1.50    Years: 40.00    Pack years: 60.00    Types: Cigarettes    Quit date: 09/22/2013    Years since quitting: 6.6  . Smokeless tobacco: Never Used  Vaping Use  . Vaping Use: Never used  Substance and Sexual Activity  . Alcohol use: Not Currently  . Drug use: Not Currently  . Sexual activity: Not on file  Other Topics Concern  . Not on file  Social History Narrative  . Not on file   Social Determinants of Health   Financial Resource  Strain:   . Difficulty of Paying Living Expenses: Not on file  Food Insecurity:   . Worried About Charity fundraiser in the Last Year: Not on file  . Ran Out of Food in the Last Year: Not on file  Transportation Needs:   . Lack of Transportation (Medical): Not on file  . Lack of Transportation (Non-Medical): Not on file  Physical Activity:   . Days of Exercise per Week: Not on file  . Minutes of Exercise per Session: Not on file  Stress:   . Feeling of Stress : Not on file  Social Connections:   . Frequency  of Communication with Friends and Family: Not on file  . Frequency of Social Gatherings with Friends and Family: Not on file  . Attends Religious Services: Not on file  . Active Member of Clubs or Organizations: Not on file  . Attends Archivist Meetings: Not on file  . Marital Status: Not on file     Family History: The patient's family history includes Lung cancer in his brother, father, mother, and sister.  ROS:   Please see the history of present illness.    All other systems reviewed and are negative.  EKGs/Labs/Other Studies Reviewed:    The following studies were reviewed today: EKG reveals sinus rhythm and nonspecific ST-T changes.   Recent Labs: 08/14/2019: ALT 20 08/15/2019: BUN 20; Creatinine, Ser 0.97; Hemoglobin 11.5; Platelets 197; Potassium 3.7; Sodium 135  Recent Lipid Panel No results found for: CHOL, TRIG, HDL, CHOLHDL, VLDL, LDLCALC, LDLDIRECT  Physical Exam:    VS:  BP 114/68   Pulse (!) 50   Ht 6' (1.829 m)   Wt 292 lb 12.8 oz (132.8 kg)   SpO2 93%   BMI 39.71 kg/m     Wt Readings from Last 3 Encounters:  05/30/20 292 lb 12.8 oz (132.8 kg)  02/28/20 282 lb 6.4 oz (128.1 kg)  11/24/19 278 lb (126.1 kg)     GEN: Patient is in no acute distress HEENT: Normal NECK: No JVD; No carotid bruits LYMPHATICS: No lymphadenopathy CARDIAC: Hear sounds regular, 2/6 systolic murmur at the apex. RESPIRATORY:  Clear to auscultation  without rales, wheezing or rhonchi  ABDOMEN: Soft, non-tender, non-distended MUSCULOSKELETAL:  No edema; No deformity  SKIN: Warm and dry NEUROLOGIC:  Alert and oriented x 3 PSYCHIATRIC:  Normal affect   Signed, Jenean Lindau, MD  05/30/2020 9:15 AM    [place]

## 2020-07-02 ENCOUNTER — Telehealth: Payer: Self-pay

## 2020-07-02 MED ORDER — METOPROLOL SUCCINATE ER 50 MG PO TB24: 75.0000 mg | ORAL_TABLET | Freq: Every day | ORAL | 4 refills | Status: DC

## 2020-07-02 NOTE — Telephone Encounter (Signed)
Refill sent to pharmacy.   

## 2020-09-17 ENCOUNTER — Telehealth: Payer: Self-pay | Admitting: Oncology

## 2020-09-17 NOTE — Telephone Encounter (Signed)
Patient's spouse called regarding Dec 2021 Labs, CT Scan, Follow up.  Gave Kelli Orders from [facility] to enter into EPIC

## 2020-09-18 ENCOUNTER — Other Ambulatory Visit: Payer: Self-pay | Admitting: Hematology and Oncology

## 2020-09-18 DIAGNOSIS — C3411 Malignant neoplasm of upper lobe, right bronchus or lung: Secondary | ICD-10-CM

## 2020-09-19 ENCOUNTER — Telehealth: Payer: Self-pay | Admitting: Oncology

## 2020-09-19 NOTE — Telephone Encounter (Signed)
Per Mosaqi, patient scheduled for 09/20/2020 Labs, CT Chest.  Patient also scheduled for 09/24/2020 Follow Up (gave instructions)

## 2020-09-23 NOTE — Progress Notes (Unsigned)
Aaron [name]  [address] [place],  [place]  [address] [phone]  Clinic Day:  09/24/2020  Referring physician: Lowella Dandy, NP   HISTORY OF PRESENT ILLNESS:  The patient is a 60 y.o. male with stage IA3 (T1c N0 M0) lung adenocarcinoma, status post a right upper lobectomy in November 2020.  He comes in today to go over his most recent chest CT to ensure there remains no evidence of early disease recurrence.  Since his last visit, the patient has been doing well.  He denies having shortness of breath, hemoptysis, or other respiratory symptoms which concern him for disease recurrence    PHYSICAL EXAM:  Blood pressure 129/72, pulse (!) 48, temperature 98.1 F (36.7 C), resp. rate 18, height 6' (1.829 m), weight 281 lb 11.2 oz (127.8 kg), SpO2 94 %. Wt Readings from Last 3 Encounters:  09/24/20 281 lb 11.2 oz (127.8 kg)  05/30/20 292 lb 12.8 oz (132.8 kg)  02/28/20 282 lb 6.4 oz (128.1 kg)   Body mass index is 38.21 kg/m. Performance status (ECOG): 0 Physical Exam Constitutional:      Appearance: Normal appearance. He is not ill-appearing.  HENT:     Mouth/Throat:     Mouth: Mucous membranes are moist.     Pharynx: Oropharynx is clear. No oropharyngeal exudate or posterior oropharyngeal erythema.  Cardiovascular:     Rate and Rhythm: Normal rate and regular rhythm.     Heart sounds: No murmur heard. No friction rub. No gallop.   Pulmonary:     Effort: Pulmonary effort is normal. No respiratory distress.     Breath sounds: Normal breath sounds. No wheezing, rhonchi or rales.  Chest:  Breasts:     Right: No axillary adenopathy or supraclavicular adenopathy.     Left: No axillary adenopathy or supraclavicular adenopathy.    Abdominal:     General: Bowel sounds are normal. There is no distension.     Palpations: Abdomen is soft. There is no mass.     Tenderness: There is no abdominal tenderness.  Musculoskeletal:        General: No  swelling.     Right lower leg: No edema.     Left lower leg: No edema.  Lymphadenopathy:     Cervical: No cervical adenopathy.     Upper Body:     Right upper body: No supraclavicular or axillary adenopathy.     Left upper body: No supraclavicular or axillary adenopathy.     Lower Body: No right inguinal adenopathy. No left inguinal adenopathy.  Skin:    General: Skin is warm.     Coloration: Skin is not jaundiced.     Findings: No lesion or rash.  Neurological:     General: No focal deficit present.     Mental Status: He is alert and oriented to person, place, and time. Mental status is at baseline.     Cranial Nerves: Cranial nerves are intact.  Psychiatric:        Mood and Affect: Mood normal.        Behavior: Behavior normal.        Thought Content: Thought content normal.     LABS:    STUDIES:  His chest CT done last week revealed the following:  FINDINGS: Cardiovascular: Moderate three-vessel coronary artery atherosclerosis with minimal involvement of the aortic arch. No acute vascular findings. The heart size is normal. There is no pericardial effusion.  Mediastinum/Nodes: There are no enlarged  mediastinal, hilar or axillary lymph nodes.There are stable small mediastinal and hilar lymph nodes bilaterally. The thyroid gland, trachea and esophagus demonstrate no significant findings.  Lungs/Pleura: Stable small dependent right pleural effusion versus pleural thickening without nodularity. There are stable postsurgical changes from previous right upper lobe resection. Subpleural scarring peripherally in the right lung is stable. There is stable mild to moderate centrilobular and paraseptal emphysema. No suspicious pulmonary nodularity. Previously noted minimal nodularity along the right hemidiaphragm appears less evident.  Upper abdomen: The visualized upper abdomen appears stable without significant findings. Previous cholecystectomy.  Musculoskeletal/Chest  wall: There is no chest wall mass or suspicious osseous finding.  IMPRESSION: 1. Stable postsurgical changes from previous right upper lobe resection. No evidence of local recurrence or metastatic disease. 2. Stable small dependent right pleural effusion versus pleural thickening. 3. Coronary artery atherosclerosis. 4. Aortic Atherosclerosis (ICD10-I70.0) and Emphysema (ICD10-J43.9).  ASSESSMENT & PLAN:  Assessment/Plan:  A 60 y.o. male with stage IA3 (T1c N0 M0) lung adenocarcinoma, status post a right upper lobectomy in November 2020.   In clinic today, I went over his chest CT images with him, for which he could see he remains disease-free.  Clinically, he appears to be doing well.  I will see him back in 4 months for repeat clinical assessment.  I will begin alternating chest x-rays and CT scans with all future visits, with a chest x-ray being done on the day of his next visit for his continued radiographic lung cancer surveillance.  The patient understands all the plans discussed today and is in agreement with them.     Ariela Mochizuki Macarthur Critchley, MD

## 2020-09-24 ENCOUNTER — Telehealth: Payer: Self-pay | Admitting: Oncology

## 2020-09-24 ENCOUNTER — Inpatient Hospital Stay: Payer: 59 | Attending: Oncology | Admitting: Oncology

## 2020-09-24 ENCOUNTER — Other Ambulatory Visit: Payer: Self-pay | Admitting: Oncology

## 2020-09-24 ENCOUNTER — Other Ambulatory Visit: Payer: Self-pay

## 2020-09-24 VITALS — BP 129/72 | HR 48 | Temp 98.1°F | Resp 18 | Ht 72.0 in | Wt 281.7 lb

## 2020-09-24 DIAGNOSIS — C3411 Malignant neoplasm of upper lobe, right bronchus or lung: Secondary | ICD-10-CM

## 2020-09-24 NOTE — Telephone Encounter (Signed)
Per 1/3 los next appt given to patient 

## 2020-09-24 NOTE — Progress Notes (Signed)
Pt is aware of bradycardia.  Is going to f/u with Revankar.

## 2020-09-26 ENCOUNTER — Encounter: Payer: Self-pay | Admitting: Oncology

## 2020-09-27 ENCOUNTER — Other Ambulatory Visit: Payer: Self-pay | Admitting: Cardiology

## 2020-09-27 NOTE — Telephone Encounter (Signed)
Rx refill sent to pharmacy. 

## 2020-11-23 ENCOUNTER — Other Ambulatory Visit: Payer: Self-pay | Admitting: Cardiology

## 2020-11-27 ENCOUNTER — Other Ambulatory Visit: Payer: Self-pay

## 2020-11-27 DIAGNOSIS — R112 Nausea with vomiting, unspecified: Secondary | ICD-10-CM | POA: Insufficient documentation

## 2020-11-27 DIAGNOSIS — I480 Paroxysmal atrial fibrillation: Secondary | ICD-10-CM | POA: Insufficient documentation

## 2020-11-27 DIAGNOSIS — F419 Anxiety disorder, unspecified: Secondary | ICD-10-CM | POA: Insufficient documentation

## 2020-11-27 DIAGNOSIS — T8859XA Other complications of anesthesia, initial encounter: Secondary | ICD-10-CM | POA: Insufficient documentation

## 2020-11-27 DIAGNOSIS — Z9889 Other specified postprocedural states: Secondary | ICD-10-CM | POA: Insufficient documentation

## 2020-11-27 DIAGNOSIS — I499 Cardiac arrhythmia, unspecified: Secondary | ICD-10-CM | POA: Insufficient documentation

## 2020-11-29 ENCOUNTER — Ambulatory Visit: Payer: 59 | Admitting: Cardiology

## 2020-11-29 ENCOUNTER — Encounter: Payer: Self-pay | Admitting: Cardiology

## 2020-11-29 ENCOUNTER — Telehealth (HOSPITAL_COMMUNITY): Payer: Self-pay | Admitting: *Deleted

## 2020-11-29 ENCOUNTER — Encounter (HOSPITAL_COMMUNITY): Payer: Self-pay | Admitting: *Deleted

## 2020-11-29 ENCOUNTER — Other Ambulatory Visit: Payer: Self-pay

## 2020-11-29 ENCOUNTER — Telehealth: Payer: Self-pay | Admitting: Cardiology

## 2020-11-29 VITALS — BP 122/68 | HR 46 | Ht 72.0 in | Wt 286.6 lb

## 2020-11-29 DIAGNOSIS — E782 Mixed hyperlipidemia: Secondary | ICD-10-CM | POA: Diagnosis not present

## 2020-11-29 DIAGNOSIS — E669 Obesity, unspecified: Secondary | ICD-10-CM

## 2020-11-29 DIAGNOSIS — I480 Paroxysmal atrial fibrillation: Secondary | ICD-10-CM

## 2020-11-29 DIAGNOSIS — I2510 Atherosclerotic heart disease of native coronary artery without angina pectoris: Secondary | ICD-10-CM

## 2020-11-29 HISTORY — DX: Obesity, unspecified: E66.9

## 2020-11-29 MED ORDER — METOPROLOL SUCCINATE ER 25 MG PO TB24: 25.0000 mg | ORAL_TABLET | Freq: Every day | ORAL | 3 refills | Status: DC

## 2020-11-29 MED ORDER — METOPROLOL SUCCINATE ER 25 MG PO TB24: 25.0000 mg | ORAL_TABLET | Freq: Two times a day (BID) | ORAL | 3 refills | Status: DC

## 2020-11-29 NOTE — Progress Notes (Signed)
Cardiology Office Note:    Date:  11/29/2020   ID:  Aaron [name], DOB [birth year], MRN [id number]  PCP:  Aaron Dandy, NP  Cardiologist:  Aaron Lindau, MD   Referring MD: Aaron Dandy, NP    ASSESSMENT:    1. Coronary artery calcification seen on CAT scan   2. PAF (paroxysmal atrial fibrillation) ([place])   3. Mixed dyslipidemia    PLAN:    In order of problems listed above:  1. Coronary artery calcification seen on CT scan: Asymptomatic patient.  Secondary prevention stressed with the patient.  Importance of compliance with diet medication stressed any vocalized understanding.  I discussed coronary calcification report any vocalized understanding. 2. Paroxysmal atrial fibrillation:I discussed with the patient atrial fibrillation, disease process. Management and therapy including rate and rhythm control, anticoagulation benefits and potential risks were discussed extensively with the patient. Patient had multiple questions which were answered to patient's satisfaction.  Patient is not keen on getting off flecainide.  I told him about coronary calcifications and its implications.  I told him that we could do a stress test on a regular basis every year and he is agreeable for it.  He tells me that flecainide has helped him a lot.  He wishes to continue it.  I will respect his wishes.  Benefits and potential is explained to him and he vocalized understanding.  Arrhythmia potential was discussed.  I will make my further recommendation depending on the results of the stress test.  I also suggested CT coronary angiography with FFR but he prefers stress testing. 3. Essential hypertension: Blood pressure stable and diet was emphasized.  Because of sinus bradycardia I will reduce his beta-blocker to 25 mg twice daily. 4. Mixed dyslipidemia: Diet was emphasized.  Weight reduction stressed importance of regular exercise stressed he will have complete blood work today including lipids 5. Patient will  be seen in follow-up appointment in 4 months or earlier if the patient has any concerns    Medication Adjustments/Labs and Tests Ordered: Current medicines are reviewed at length with the patient today.  Concerns regarding medicines are outlined above.  No orders of the defined types were placed in this encounter.  No orders of the defined types were placed in this encounter.    No chief complaint on file.    History of Present Illness:    Aaron [name] is a 60 y.o. male.  Patient has past medical history of paroxysmal atrial fibrillation, essential hypertension dyslipidemia and coronary artery calcification on recent CT scan.  He is obese and active but does not exercise on a regular basis.  He denies any chest pain orthopnea or PND.  At the time of my evaluation, the patient is alert awake oriented and in no distress.  Past Medical History:  Diagnosis Date  . Anginal equivalent ([place]) 10/27/2019  . Anxiety   . Arrhythmia   . Complication of anesthesia   . COPD  GOLD 0 = CT criteria only  01/26/2019   Quit smoking 2015 at wt = 170 lb  - 01/25/2019   Walked RA  2 laps @  approx 255ft each @ fast pace  stopped due to  [place] at wt = 276  - PFT's  07/01/2019  FEV1 3.16 (81 % ) ratio 0.78  p 7 % improvement from saba p nothing prior to study with DLCO wnl and f/v curve ok,  ERV 2% c/w effects of body habitus  . Coronary artery  calcification seen on CAT scan 05/30/2020  . Dysrhythmia    afib  . Insomnia 02/16/2019  . Malignant 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 ([place]) 08/12/2019  . Mixed dyslipidemia   . Morbid obesity ([place]) 05/30/2020  . PAF (paroxysmal atrial fibrillation) ([place]) 07/27/2019  . Paroxysmal atrial fibrillation ([facility])   . PONV (postoperative nausea and vomiting)   . S/P lobectomy of lung 08/12/2019  . Solitary pulmonary nodule on lung CT 01/26/2019    LDSCT 11/17/18 that did show 8.7 mm RUL nodule and centrilobular emphysema / lingular tree-in-bud  - repeat CT  05/19/19 increase  RUL nodule to 15.6 mm  - PET 06/29/2019  The right upper lobe nodules highly hypermetabolic with maximum SUV of 11.2, most compatible with malignancy. No adenopathy or metastatic spread identified. 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 BACK SURGERY    . CARDIOVERSION N/A 07/15/2019   Procedure: CARDIOVERSION;  Surgeon: Aaron Casino, MD;  Location: [facility] ENDOSCOPY;  Service: Cardiovascular;  Laterality: N/A;  . CHOLECYSTECTOMY    . TEE WITH CARDIOVERSION    . TEE WITHOUT CARDIOVERSION N/A 07/15/2019   Procedure: TRANSESOPHAGEAL ECHOCARDIOGRAM (TEE);  Surgeon: Aaron Casino, MD;  Location: [place];  Service: Cardiovascular;  Laterality: N/A;  . VIDEO ASSISTED THORACOSCOPY (VATS)/ LOBECTOMY  08/12/2019   Procedure: RIGHT Video Assisted Thoracoscopy (Vats) With Right Upper Lobectomy;  Surgeon: Aaron Nakayama, MD;  Location: [facility] OR;  Service: Thoracic;;    Current Medications: Current Meds  Medication Sig  . atorvastatin (LIPITOR) 10 MG tablet Take 10 mg by mouth every evening.   Marland Kitchen ELIQUIS 5 MG TABS tablet Take 5 mg by mouth 2 (two) times daily.  Marland Kitchen escitalopram (LEXAPRO) 10 MG tablet Take 10 mg by mouth every evening.   . flecainide (TAMBOCOR) 150 MG tablet TAKE ONE TABLET BY MOUTH TWICE DAILY  . metoprolol succinate (TOPROL-XL) 50 MG 24 hr tablet Take 1.5 tablets (75 mg total) by mouth daily. Take 1/2 tab in the AM, take 1 tab in the PM with a meal  . temazepam (RESTORIL) 30 MG capsule Take 30 mg by mouth at bedtime.  . TRELEGY ELLIPTA 100-62.5-25 MCG/INH AEPB Inhale 1 puff into the lungs daily.     Allergies:   Penicillins and Codeine   Social History   Socioeconomic History  . Marital status: Married    Spouse name: Not on file  . Number of children: Not on file  . Years of education: Not on file  . Highest education level: Not on file  Occupational History  . Not on file   Tobacco Use  . Smoking status: Former Smoker    Packs/day: 1.50    Years: 40.00    Pack years: 60.00    Types: Cigarettes    Quit date: 09/22/2013    Years since quitting: 7.1  . Smokeless tobacco: Never Used  Vaping Use  . Vaping Use: Never used  Substance and Sexual Activity  . Alcohol use: Not Currently  . Drug use: Not Currently  . Sexual activity: Not on file  Other Topics Concern  . Not on file  Social History Narrative  . Not on file   Social Determinants of Health   Financial Resource Strain: Not on file  Food Insecurity: Not on file  Transportation Needs: Not on file  Physical Activity: Not on file  Stress: Not on file  Social  Connections: Not on file     Family History: The patient's family history includes Lung cancer in his brother, father, mother, and sister.  ROS:   Please see the history of present illness.    All other systems reviewed and are negative.  EKGs/Labs/Other Studies Reviewed:    The following studies were reviewed today: EKG reveals sinus bradycardia with nonspecific ST-T changes   Recent Labs: No results found for requested labs within last 8760 hours.  Recent Lipid Panel No results found for: CHOL, TRIG, HDL, CHOLHDL, VLDL, LDLCALC, LDLDIRECT  Physical Exam:    VS:  BP 122/68   Pulse (!) 46   Ht 6' (1.829 m)   Wt 286 lb 9.6 oz (130 kg)   SpO2 95%   BMI 38.87 kg/m     Wt Readings from Last 3 Encounters:  11/29/20 286 lb 9.6 oz (130 kg)  09/24/20 281 lb 11.2 oz (127.8 kg)  05/30/20 292 lb 12.8 oz (132.8 kg)     GEN: Patient is in no acute distress HEENT: Normal NECK: No JVD; No carotid bruits LYMPHATICS: No lymphadenopathy CARDIAC: Hear sounds regular, 2/6 systolic murmur at the apex. RESPIRATORY:  Clear to auscultation without rales, wheezing or rhonchi  ABDOMEN: Soft, non-tender, non-distended MUSCULOSKELETAL:  No edema; No deformity  SKIN: Warm and dry NEUROLOGIC:  Alert and oriented x 3 PSYCHIATRIC:  Normal  affect   Signed, Aaron Lindau, MD  11/29/2020 8:29 AM    [facility]

## 2020-11-29 NOTE — Patient Instructions (Signed)
Medication Instructions:  Your physician has recommended you make the following change in your medication:   Decrease your Metoprolol succinate to 25 mg twice daily.  *If you need a refill on your cardiac medications before your next appointment, please call your pharmacy*   Lab Work: Your physician recommends that you have labs done in the office today. Your test included  basic metabolic panel, complete blood count, TSH, liver function and lipids.  If you have labs (blood work) drawn today and your tests are completely normal, you will receive your results only by: Marland Kitchen MyChart Message (if you have MyChart) OR . A paper copy in the mail If you have any lab test that is abnormal or we need to change your treatment, we will call you to review the results.   Testing/Procedures:  Your physician has requested that you have a lexiscan myoview. For further information please visit [email or web address]. Please follow instruction sheet, as given.  The test will take 2 days and approximately 3 to 4 hours each day to complete; you may bring reading material.  If someone comes with you to your appointment, they will need to remain in the main lobby due to limited space in the testing area.   How to prepare for your Myocardial Perfusion Test: . Do not eat or drink 3 hours prior to your test, except you may have water. . Do not consume products containing caffeine (regular or decaffeinated) 12 hours prior to your test. (ex: coffee, chocolate, sodas, tea). . Do bring a list of your current medications with you.  If not listed below, you may take your medications as normal. . Do wear comfortable clothes (no dresses or overalls) and walking shoes, tennis shoes preferred (No heels or open toe shoes are allowed). . Do NOT wear cologne, perfume, aftershave, or lotions (deodorant is allowed). . If these instructions are not followed, your test will have to be rescheduled.    Follow-Up: At [facility],  you and your health needs are our priority.  As part of our continuing mission to provide you with exceptional heart care, we have created designated Provider Care Teams.  These Care Teams include your primary Cardiologist (physician) and Advanced Practice Providers (APPs -  Physician Assistants and Nurse Practitioners) who all work together to provide you with the care you need, when you need it.  We recommend signing up for the patient portal called "MyChart".  Sign up information is provided on this After Visit Summary.  MyChart is used to connect with patients for Virtual Visits (Telemedicine).  Patients are able to view lab/test results, encounter notes, upcoming appointments, etc.  Non-urgent messages can be sent to your provider as well.   To learn more about what you can do with MyChart, go to [email or web address].    Your next appointment:   4 month(s)  The format for your next appointment:   In Person  Provider:   Jyl Heinz, MD   Other Instructions  Cardiac Nuclear Scan  A cardiac nuclear scan is a test that is done to check the flow of blood to your heart. It is done when you are resting and when you are exercising. The test looks for problems such as:  Not enough blood reaching a portion of the heart.  The heart muscle not working as it should. You may need this test if:  You have heart disease.  You have had lab results that are not normal.  You have had heart surgery or a  balloon procedure to open up blocked arteries (angioplasty).  You have chest pain.  You have shortness of breath. In this test, a special dye (tracer) is put into your bloodstream. The tracer will travel to your heart. A camera will then take pictures of your heart to see how the tracer moves through your heart. This test is usually done at a hospital and takes 2-4 hours. Tell a doctor about:  Any allergies you have.  All medicines you are taking, including vitamins, herbs, eye drops,  creams, and over-the-counter medicines.  Any problems you or family members have had with anesthetic medicines.  Any blood disorders you have.  Any surgeries you have had.  Any medical conditions you have.  Whether you are pregnant or may be pregnant. What are the risks? Generally, this is a safe test. However, problems may occur, such as:  Serious chest pain and heart attack. This is only a risk if the stress portion of the test is done.  Rapid heartbeat.  A feeling of warmth in your chest. This feeling usually does not last long.  Allergic reaction to the tracer. What happens before the test?  Ask your doctor about changing or stopping your normal medicines. This is important.  Follow instructions from your doctor about what you cannot eat or drink.  Remove your jewelry on the day of the test. What happens during the test? 1. An IV tube will be inserted into one of your veins. 2. Your doctor will give you a small amount of tracer through the IV tube. 3. You will wait for 20-40 minutes while the tracer moves through your bloodstream. 4. Your heart will be monitored with an electrocardiogram (ECG). 5. You will lie down on an exam table. 6. Pictures of your heart will be taken for about 15-20 minutes. 7. You may also have a stress test. For this test, one of these things may be done: ? You will be asked to exercise on a treadmill or a stationary bike. ? You will be given medicines that will make your heart work harder. This is done if you are unable to exercise. 8. When blood flow to your heart has peaked, a tracer will again be given through the IV tube. 9. After 20-40 minutes, you will get back on the exam table. More pictures will be taken of your heart. 10. Depending on the tracer that is used, more pictures may need to be taken 3-4 hours later. 11. Your IV tube will be removed when the test is over. The test may vary among doctors and hospitals. What happens after the  test? 1. Ask your doctor: ? Whether you can return to your normal schedule, including diet, activities, and medicines. ? Whether you should drink more fluids. This will help to remove the tracer from your body. Drink enough fluid to keep your pee (urine) pale yellow. 2. Ask your doctor, or the department that is doing the test: ? When will my results be ready? ? How will I get my results? Summary  A cardiac nuclear scan is a test that is done to check the flow of blood to your heart.  Tell your doctor whether you are pregnant or may be pregnant.  Before the test, ask your doctor about changing or stopping your normal medicines. This is important.  Ask your doctor whether you can return to your normal activities. You may be asked to drink more fluids. This information is not intended to replace advice given to  you by your health care provider. Make sure you discuss any questions you have with your health care provider. Document Revised: 12/29/2018 Document Reviewed: 02/22/2018 Elsevier Patient Education  [place].

## 2020-11-29 NOTE — Telephone Encounter (Signed)
Pt c/o medication issue:  1. Name of Medication: metoprolol succinate (TOPROL-XL) 25 MG 24 hr tablet  2. How are you currently taking this medication (dosage and times per day)? N/a   3. Are you having a reaction (difficulty breathing--STAT)? N/a   4. What is your medication issue?   Aaron [name] with [facility] is requesting clarification for the Metoprolol dosage instructions prior to distributing it to the patient. She states they were given 2 sets of instructions.

## 2020-11-29 NOTE — Telephone Encounter (Signed)
Directions clarified with Mel Almond. She verbalized understanding and had no additional questions.

## 2020-11-29 NOTE — Telephone Encounter (Signed)
Instruction letter for stress test on 12/04/20 @ 11!5 via [employer].  Voice mailbox is full.

## 2020-11-30 LAB — HEPATIC FUNCTION PANEL
ALT: 25 IU/L (ref 0–44)
AST: 21 IU/L (ref 0–40)
Albumin: 3.9 g/dL (ref 3.8–4.8)
Alkaline Phosphatase: 76 IU/L (ref 44–121)
Bilirubin Total: 0.5 mg/dL (ref 0.0–1.2)
Bilirubin, Direct: 0.15 mg/dL (ref 0.00–0.40)
Total Protein: 7.1 g/dL (ref 6.0–8.5)

## 2020-11-30 LAB — CBC WITH DIFFERENTIAL/PLATELET
Basophils Absolute: 0.1 10*3/uL (ref 0.0–0.2)
Basos: 1 %
EOS (ABSOLUTE): 0.1 10*3/uL (ref 0.0–0.4)
Eos: 2 %
Hematocrit: 44.4 % (ref 37.5–51.0)
Hemoglobin: 14.9 g/dL (ref 13.0–17.7)
Immature Grans (Abs): 0 10*3/uL (ref 0.0–0.1)
Immature Granulocytes: 0 %
Lymphocytes Absolute: 1.4 10*3/uL (ref 0.7–3.1)
Lymphs: 17 %
MCH: 30.2 pg (ref 26.6–33.0)
MCHC: 33.6 g/dL (ref 31.5–35.7)
MCV: 90 fL (ref 79–97)
Monocytes Absolute: 1.1 10*3/uL — ABNORMAL HIGH (ref 0.1–0.9)
Monocytes: 14 %
Neutrophils Absolute: 5.3 10*3/uL (ref 1.4–7.0)
Neutrophils: 66 %
Platelets: 281 10*3/uL (ref 150–450)
RBC: 4.93 x10E6/uL (ref 4.14–5.80)
RDW: 12.4 % (ref 11.6–15.4)
WBC: 8.1 10*3/uL (ref 3.4–10.8)

## 2020-11-30 LAB — BASIC METABOLIC PANEL
BUN/Creatinine Ratio: 17 (ref 10–24)
BUN: 16 mg/dL (ref 8–27)
CO2: 22 mmol/L (ref 20–29)
Calcium: 8.6 mg/dL (ref 8.6–10.2)
Chloride: 103 mmol/L (ref 96–106)
Creatinine, Ser: 0.94 mg/dL (ref 0.76–1.27)
Glucose: 88 mg/dL (ref 65–99)
Potassium: 4.7 mmol/L (ref 3.5–5.2)
Sodium: 139 mmol/L (ref 134–144)
eGFR: 92 mL/min/{1.73_m2} (ref >59)

## 2020-11-30 LAB — LIPID PANEL
Chol/HDL Ratio: 4.1 ratio (ref 0.0–5.0)
Cholesterol, Total: 139 mg/dL (ref 100–199)
HDL: 34 mg/dL — ABNORMAL LOW (ref >39)
LDL Chol Calc (NIH): 87 mg/dL (ref 0–99)
Triglycerides: 95 mg/dL (ref 0–149)
VLDL Cholesterol Cal: 18 mg/dL (ref 5–40)

## 2020-11-30 LAB — TSH: TSH: 2.02 u[IU]/mL (ref 0.450–4.500)

## 2020-12-04 ENCOUNTER — Ambulatory Visit (INDEPENDENT_AMBULATORY_CARE_PROVIDER_SITE_OTHER): Payer: 59

## 2020-12-04 ENCOUNTER — Other Ambulatory Visit: Payer: Self-pay

## 2020-12-04 DIAGNOSIS — I2510 Atherosclerotic heart disease of native coronary artery without angina pectoris: Secondary | ICD-10-CM

## 2020-12-04 DIAGNOSIS — I480 Paroxysmal atrial fibrillation: Secondary | ICD-10-CM | POA: Diagnosis not present

## 2020-12-04 MED ORDER — REGADENOSON 0.4 MG/5ML IV SOLN
0.4000 mg | Freq: Once | INTRAVENOUS | Status: AC
  Administered 2020-12-04: 0.4 mg via INTRAVENOUS

## 2020-12-04 MED ORDER — TECHNETIUM TC 99M TETROFOSMIN IV KIT
32.4000 | PACK | Freq: Once | INTRAVENOUS | Status: AC | PRN
  Administered 2020-12-04: 32.4 via INTRAVENOUS

## 2020-12-05 ENCOUNTER — Ambulatory Visit: Payer: 59

## 2020-12-05 MED ORDER — TECHNETIUM TC 99M TETROFOSMIN IV KIT
32.4000 | PACK | Freq: Once | INTRAVENOUS | Status: AC | PRN
  Administered 2020-12-05: 31.8 via INTRAVENOUS

## 2020-12-06 LAB — MYOCARDIAL PERFUSION IMAGING
LV dias vol: 163 mL (ref 62–150)
LV sys vol: 57 mL
Peak HR: 64 {beats}/min
Rest HR: 52 {beats}/min
SDS: 2
SRS: 4
SSS: 6
TID: 0.97

## 2021-01-21 NOTE — Progress Notes (Signed)
[facility]  [address] [place],  [place]  [address] [phone]  Clinic Day:  01/22/2021  Referring physician: Lowella Dandy, NP   HISTORY OF PRESENT ILLNESS:  The patient is a 60 y.o. male with stage IA3 (T1c N0 M0) lung adenocarcinoma, status post a right upper lobectomy in November 2020.  He comes in today to go over his chest x-ray done yesterday to ensure there remains no evidence of early disease recurrence.  Since his last visit, the patient has been doing well.  He denies having shortness of breath, hemoptysis, or other respiratory symptoms which concern him for disease recurrence   PHYSICAL EXAM:  Blood pressure 123/89, pulse (!) 47, temperature 98 F (36.7 C), resp. rate 18, height 6' (1.829 m), weight 284 lb 6.4 oz (129 kg), SpO2 94 %. Wt Readings from Last 3 Encounters:  01/22/21 284 lb 6.4 oz (129 kg)  12/04/20 286 lb (129.7 kg)  11/29/20 286 lb 9.6 oz (130 kg)   Body mass index is 38.57 kg/m. Performance status (ECOG): 0 Physical Exam Constitutional:      Appearance: Normal appearance. He is not ill-appearing.  HENT:     Mouth/Throat:     Mouth: Mucous membranes are moist.     Pharynx: Oropharynx is clear. No oropharyngeal exudate or posterior oropharyngeal erythema.  Cardiovascular:     Rate and Rhythm: Normal rate and regular rhythm.     Heart sounds: No murmur heard. No friction rub. No gallop.   Pulmonary:     Effort: Pulmonary effort is normal. No respiratory distress.     Breath sounds: Normal breath sounds. No wheezing, rhonchi or rales.  Chest:  Breasts:     Right: No axillary adenopathy or supraclavicular adenopathy.     Left: No axillary adenopathy or supraclavicular adenopathy.    Abdominal:     General: Bowel sounds are normal. There is no distension.     Palpations: Abdomen is soft. There is no mass.     Tenderness: There is no abdominal tenderness.  Musculoskeletal:        General: No swelling.     Right  lower leg: No edema.     Left lower leg: No edema.  Lymphadenopathy:     Cervical: No cervical adenopathy.     Upper Body:     Right upper body: No supraclavicular or axillary adenopathy.     Left upper body: No supraclavicular or axillary adenopathy.     Lower Body: No right inguinal adenopathy. No left inguinal adenopathy.  Skin:    General: Skin is warm.     Coloration: Skin is not jaundiced.     Findings: No lesion or rash.  Neurological:     General: No focal deficit present.     Mental Status: He is alert and oriented to person, place, and time. Mental status is at baseline.     Cranial Nerves: Cranial nerves are intact.  Psychiatric:        Mood and Affect: Mood normal.        Behavior: Behavior normal.        Thought Content: Thought content normal.    SCANS:  His chest x-ray done yesterday revealed the following: FINDINGS: Heart size and mediastinal contours remain within normal limits. Surgical clips are again seen in the right paratracheal region and right midlung. Pleural-parenchymal scarring in the right mid and lower lung remains stable, consistent with postop changes. No evidence of new or increased  areas of pulmonary opacity. No evidence of pleural effusion.  IMPRESSION: Stable right lung pleural-parenchymal scarring.  No active disease.  ASSESSMENT & PLAN:  Assessment/Plan:  A 60 y.o. male with stage IA3 (T1c N0 M0) lung adenocarcinoma, status post a right upper lobectomy in November 2020.   In clinic today, I went over his chest x-ray images with him, for which he could see he remains disease free. Clinically, the patient is doing very well.  I will see him back in 4 months for repeat clinical assessment, with a chest CT being done a day before his next visit for his continued radiographic lung cancer surveillance.  The patient understands all the plans discussed today and is in agreement with them.    Celestina Gironda Macarthur Critchley, MD

## 2021-01-22 ENCOUNTER — Other Ambulatory Visit: Payer: Self-pay | Admitting: Hematology and Oncology

## 2021-01-22 ENCOUNTER — Other Ambulatory Visit: Payer: Self-pay | Admitting: Oncology

## 2021-01-22 ENCOUNTER — Other Ambulatory Visit: Payer: Self-pay

## 2021-01-22 ENCOUNTER — Inpatient Hospital Stay: Payer: 59

## 2021-01-22 ENCOUNTER — Inpatient Hospital Stay: Payer: 59 | Attending: Oncology | Admitting: Oncology

## 2021-01-22 VITALS — BP 123/89 | HR 47 | Temp 98.0°F | Resp 18 | Ht 72.0 in | Wt 284.4 lb

## 2021-01-22 DIAGNOSIS — C3411 Malignant neoplasm of upper lobe, right bronchus or lung: Secondary | ICD-10-CM | POA: Diagnosis not present

## 2021-01-22 LAB — BASIC METABOLIC PANEL
BUN: 20 (ref 4–21)
CO2: 25 — AB (ref 13–22)
Chloride: 103 (ref 99–108)
Creatinine: 0.7 (ref 0.6–1.3)
Glucose: 88
Potassium: 4.5 (ref 3.4–5.3)
Sodium: 136 — AB (ref 137–147)

## 2021-01-22 LAB — CBC
MCV: 92 (ref 80–94)
RBC: 5.08 (ref 3.87–5.11)

## 2021-01-22 LAB — CBC AND DIFFERENTIAL
HCT: 47 (ref 41–53)
Hemoglobin: 15.4 (ref 13.5–17.5)
Neutrophils Absolute: 6.65
Platelets: 257 (ref 150–399)
WBC: 9.5

## 2021-01-22 LAB — HEPATIC FUNCTION PANEL
ALT: 22 (ref 10–40)
AST: 24 (ref 14–40)
Alkaline Phosphatase: 73 (ref 25–125)
Bilirubin, Total: 1.3

## 2021-01-22 LAB — COMPREHENSIVE METABOLIC PANEL
Albumin: 4 (ref 3.5–5.0)
Calcium: 8.4 — AB (ref 8.7–10.7)

## 2021-02-04 ENCOUNTER — Encounter: Payer: Self-pay | Admitting: Oncology

## 2021-04-10 ENCOUNTER — Other Ambulatory Visit: Payer: Self-pay

## 2021-04-11 ENCOUNTER — Ambulatory Visit: Payer: 59 | Admitting: Cardiology

## 2021-04-11 ENCOUNTER — Other Ambulatory Visit: Payer: Self-pay

## 2021-04-11 ENCOUNTER — Encounter: Payer: Self-pay | Admitting: Cardiology

## 2021-04-11 VITALS — BP 124/66 | HR 51 | Ht 72.0 in | Wt 292.0 lb

## 2021-04-11 DIAGNOSIS — I480 Paroxysmal atrial fibrillation: Secondary | ICD-10-CM

## 2021-04-11 DIAGNOSIS — I2510 Atherosclerotic heart disease of native coronary artery without angina pectoris: Secondary | ICD-10-CM

## 2021-04-11 DIAGNOSIS — E782 Mixed hyperlipidemia: Secondary | ICD-10-CM | POA: Diagnosis not present

## 2021-04-11 DIAGNOSIS — C3411 Malignant neoplasm of upper lobe, right bronchus or lung: Secondary | ICD-10-CM

## 2021-04-11 NOTE — Progress Notes (Signed)
Cardiology Office Note:    Date:  04/11/2021   ID:  Aaron [name], DOB August 16, 1961, MRN [id number]  PCP:  Lowella Dandy, NP  Cardiologist:  Jenean Lindau, MD   Referring MD: Lowella Dandy, NP    ASSESSMENT:    1. PAF (paroxysmal atrial fibrillation) ([place])   2. Coronary artery calcification seen on CAT scan   3. Mixed dyslipidemia   4. Morbid obesity ([place])   5. Malignant 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 (HCC)    PLAN:    In order of problems listed above:  Coronary artery calcification: Secondary prevention stressed with the patient.  Importance of compliance with diet medication stressed and vocalized understanding.  He is on flecainide and is very keen on continuing it.  He cannot afford medication such as dronedarone.  With his significant smoking history I am reluctant to put this young gentleman on amiodarone.  He prefers flecainide.  His stress test was fine and he will continue it.  Benefits and potential is explained and he vocalized understanding.  EKG done today was unremarkable.  I told him to walk half an hour on a daily basis and exercise.  He understands and plans to do so. Paroxysmal atrial fibrillation:I discussed with the patient atrial fibrillation, disease process. Management and therapy including rate and rhythm control, anticoagulation benefits and potential risks were discussed extensively with the patient. Patient had multiple questions which were answered to patient's satisfaction. Essential hypertension: Blood pressure stable and diet was emphasized. Mixed dyslipidemia: Recent lipids were reviewed and they are fine and I am happy about it.  Lipids followed by primary care. Morbid obesity: Weight reduction was stressed and diet was emphasized risks of obesity explained and discussed to do better. Patient will be seen in follow-up appointment in 6 months or earlier if the patient has any concerns    Medication Adjustments/Labs and Tests Ordered: Current  medicines are reviewed at length with the patient today.  Concerns regarding medicines are outlined above.  No orders of the defined types were placed in this encounter.  No orders of the defined types were placed in this encounter.    No chief complaint on file.    History of Present Illness:    Aaron [name] is a 60 y.o. male.  Patient has history of coronary artery calcifications, cancer of the lung in remission, essential hypertension dyslipidemia obesity and paroxysmal atrial fibrillation.  He denies any problems at this time and takes care of activities of daily living.  No chest pain orthopnea or PND.  At the time of my evaluation, the patient is alert awake oriented and in no distress.  Past Medical History:  Diagnosis Date   Anginal equivalent ([place]) 10/27/2019   Anxiety    Arrhythmia    Complication of anesthesia    COPD  GOLD 0 = CT criteria only  01/26/2019   Quit smoking 2015 at wt = 170 lb  - 01/25/2019   Walked RA  2 laps @  approx 242ft each @ fast pace  stopped due to  [place] at wt = 276  - PFT's  07/01/2019  FEV1 3.16 (81 % ) ratio 0.78  p 7 % improvement from saba p nothing prior to study with DLCO wnl and f/v curve ok,  ERV 2% c/w effects of body habitus   Coronary artery calcification seen on CAT scan 05/30/2020   Dysrhythmia    afib   Insomnia 02/16/2019   Malignant  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 ([place]) 08/12/2019   Mixed dyslipidemia    Morbid obesity ([place]) 05/30/2020   Obesity (BMI 35.0-39.9 without comorbidity) 11/29/2020   PAF (paroxysmal atrial fibrillation) ([place]) 07/27/2019   Paroxysmal atrial fibrillation (HCC)    PONV (postoperative nausea and vomiting)    S/P lobectomy of lung 08/12/2019   Solitary pulmonary nodule on lung CT 01/26/2019    LDSCT 11/17/18 that did show 8.7 mm RUL nodule and centrilobular emphysema / lingular tree-in-bud  - repeat CT  05/19/19 increase RUL nodule to 15.6 mm  - PET 06/29/2019  The right upper lobe nodules highly hypermetabolic  with maximum SUV of 11.2, most compatible with malignancy. No adenopathy or metastatic spread identified. 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 ([place]    Past Surgical History:  Procedure Laterality Date   BACK SURGERY     CARDIOVERSION N/A 07/15/2019   Procedure: CARDIOVERSION;  Surgeon: Pixie Casino, MD;  Location: [facility];  Service: Cardiovascular;  Laterality: N/A;   CHOLECYSTECTOMY     TEE WITH CARDIOVERSION     TEE WITHOUT CARDIOVERSION N/A 07/15/2019   Procedure: TRANSESOPHAGEAL ECHOCARDIOGRAM (TEE);  Surgeon: Pixie Casino, MD;  Location: [facility];  Service: Cardiovascular;  Laterality: N/A;   [place] (VATS)/ LOBECTOMY  08/12/2019   Procedure: RIGHT Video Assisted Thoracoscopy (Vats) With Right Upper Lobectomy;  Surgeon: Melrose Nakayama, MD;  Location: [facility];  Service: Thoracic;;    Current Medications: Current Meds  Medication Sig   atorvastatin (LIPITOR) 10 MG tablet Take 10 mg by mouth every evening.    ELIQUIS 5 MG TABS tablet Take 5 mg by mouth 2 (two) times daily.   escitalopram (LEXAPRO) 10 MG tablet Take 10 mg by mouth every evening.    flecainide (TAMBOCOR) 150 MG tablet Take 150 mg by mouth 2 (two) times daily.   metoprolol succinate (TOPROL-XL) 25 MG 24 hr tablet Take 1 tablet (25 mg total) by mouth 2 (two) times daily.   temazepam (RESTORIL) 30 MG capsule Take 30 mg by mouth at bedtime.   TRELEGY ELLIPTA 100-62.5-25 MCG/INH AEPB Inhale 1 puff into the lungs daily.     Allergies:   Penicillins and Codeine   Social History   Socioeconomic History   Marital status: Married    Spouse name: Not on file   Number of children: Not on file   Years of education: Not on file   Highest education level: Not on file  Occupational History   Not on file  Tobacco Use   Smoking status: Former    Packs/day: 1.50    Years: 40.00    Pack years: 60.00    Types: Cigarettes    Quit date: 09/22/2013     Years since quitting: 7.5   Smokeless tobacco: Never  Vaping Use   Vaping Use: Never used  Substance and Sexual Activity   Alcohol use: Not Currently   Drug use: Not Currently   Sexual activity: Not on file  Other Topics Concern   Not on file  Social History Narrative   Not on file   Social Determinants of Health   Financial Resource Strain: Not on file  Food Insecurity: Not on file  Transportation Needs: Not on file  Physical Activity: Not on file  Stress: Not on file  Social Connections: Not on file     Family History: The patient's family history includes Lung cancer in his brother, father, mother, and sister.  ROS:   Please see the history of present illness.    All other systems reviewed and are negative.  EKGs/Labs/Other Studies Reviewed:    The following studies were reviewed today: EKG reveals sinus rhythm and nonspecific ST-T changes.   Recent Labs: 11/29/2020: TSH 2.020 01/22/2021: ALT 22; BUN 20; Creatinine 0.7; Hemoglobin 15.4; Platelets 257; Potassium 4.5; Sodium 136  Recent Lipid Panel    Component Value Date/Time   CHOL 139 11/29/2020 0837   TRIG 95 11/29/2020 0837   HDL 34 (L) 11/29/2020 0837   CHOLHDL 4.1 11/29/2020 0837   LDLCALC 87 11/29/2020 0837    Physical Exam:    VS:  BP 124/66   Pulse (!) 51   Ht 6' (1.829 m)   Wt 292 lb (132.5 kg)   SpO2 94%   BMI 39.60 kg/m     Wt Readings from Last 3 Encounters:  04/11/21 292 lb (132.5 kg)  01/22/21 284 lb 6.4 oz (129 kg)  12/04/20 286 lb (129.7 kg)     GEN: Patient is in no acute distress HEENT: Normal NECK: No JVD; No carotid bruits LYMPHATICS: No lymphadenopathy CARDIAC: Hear sounds regular, 2/6 systolic murmur at the apex. RESPIRATORY:  Clear to auscultation without rales, wheezing or rhonchi  ABDOMEN: Soft, non-tender, non-distended MUSCULOSKELETAL:  No edema; No deformity  SKIN: Warm and dry NEUROLOGIC:  Alert and oriented x 3 PSYCHIATRIC:  Normal affect   Signed, Jenean Lindau, MD  04/11/2021 8:16 AM    [facility]

## 2021-04-11 NOTE — Patient Instructions (Signed)

## 2021-05-24 ENCOUNTER — Inpatient Hospital Stay: Payer: 59 | Admitting: Oncology

## 2021-05-24 NOTE — Progress Notes (Signed)
[facility]  [address] [place],  [place]  [address] [phone]  Clinic Day:  06/03/2021  Referring physician: Lowella Dandy, NP  This document serves as a record of services personally performed by [name], MD. It was created on their behalf by Curry,Lauren E, a trained medical scribe. The creation of this record is based on the scribe's personal observations and the provider's statements to them.  HISTORY OF PRESENT ILLNESS:  The patient is a 60 y.o. male with stage IA3 (T1c N0 M0) lung adenocarcinoma, status post a right upper lobectomy in November 2020.  He comes in today to go over his chest CT to ensure there remains no evidence of early disease recurrence.  Since his last visit, the patient has been doing well.  He has chronic shortness of breath, which has been an intermittent issue since his lung cancer surgery.  He denies having other respiratory symptoms which concern him for disease recurrence.    PHYSICAL EXAM:  Blood pressure 124/72, pulse (!) 55, temperature 98.1 F (36.7 C), resp. rate 18, height 6' (1.829 m), weight 291 lb 3.2 oz (132.1 kg), SpO2 92 %. Wt Readings from Last 3 Encounters:  06/03/21 291 lb 3.2 oz (132.1 kg)  04/11/21 292 lb (132.5 kg)  01/22/21 284 lb 6.4 oz (129 kg)   Body mass index is 39.49 kg/m. Performance status (ECOG): 0 Physical Exam Constitutional:      Appearance: Normal appearance. He is not ill-appearing.  HENT:     Mouth/Throat:     Mouth: Mucous membranes are moist.     Pharynx: Oropharynx is clear. No oropharyngeal exudate or posterior oropharyngeal erythema.  Cardiovascular:     Rate and Rhythm: Normal rate and regular rhythm.     Heart sounds: No murmur heard.   No friction rub. No gallop.  Pulmonary:     Effort: Pulmonary effort is normal. No respiratory distress.     Breath sounds: Normal breath sounds. No wheezing, rhonchi or rales.  Abdominal:     General: Bowel sounds are  normal. There is no distension.     Palpations: Abdomen is soft. There is no mass.     Tenderness: There is no abdominal tenderness.  Musculoskeletal:        General: No swelling.     Right lower leg: No edema.     Left lower leg: No edema.  Lymphadenopathy:     Cervical: No cervical adenopathy.     Upper Body:     Right upper body: No supraclavicular or axillary adenopathy.     Left upper body: No supraclavicular or axillary adenopathy.     Lower Body: No right inguinal adenopathy. No left inguinal adenopathy.  Skin:    General: Skin is warm.     Coloration: Skin is not jaundiced.     Findings: No lesion or rash.  Neurological:     General: No focal deficit present.     Mental Status: He is alert and oriented to person, place, and time. Mental status is at baseline.     Cranial Nerves: Cranial nerves are intact.  Psychiatric:        Mood and Affect: Mood normal.        Behavior: Behavior normal.        Thought Content: Thought content normal.   SCANS:  His CT chest revealed the following: FINDINGS: Cardiovascular: Aortic atherosclerosis. Tortuous thoracic aorta. Borderline cardiomegaly. Left main and 3 vessel coronary artery  calcification. No central pulmonary embolism, on this non-dedicated study.  Mediastinum/Nodes: No supraclavicular adenopathy. No mediastinal or hilar adenopathy.  Lungs/Pleura: Resolved right-sided pleural fluid with mild pleural thickening remaining.  Right upper lobectomy.  Mild centrilobular and moderate paraseptal emphysema.  Upper Abdomen: Cholecystectomy. Normal imaged portions of the liver, spleen, stomach, pancreas, adrenal glands, right kidney.  Musculoskeletal: No acute osseous abnormality.  IMPRESSION: 1. Status post right upper lobectomy, without recurrent or metastatic disease. 2. Resolved trace right pleural fluid. Right-sided pleural thickening remains. 3. Aortic atherosclerosis (ICD10-I70.0) and emphysema (ICD10-J43.9). 4.  Age advanced coronary artery atherosclerosis. Recommend assessment of coronary risk factors and consideration of medical therapy.  ASSESSMENT & PLAN:  Assessment/Plan:  A 60 y.o. male with stage IA3 (T1c N0 M0) lung adenocarcinoma, status post a right upper lobectomy in November 2020.   In clinic today, I went over his chest CT images with him, for which he could see he remains disease free. From a lung cancer perspective, the patient is doing very well.  As he has persistent shortness of breath, I will have him see pulmonology in [facility] for further evaluation.  Otherwise, I will see him back in 6 months for repeat clinical assessment, with a chest x-ray being done on the day of his next visit for his continued radiographic lung cancer surveillance.  The patient understands all the plans discussed today and is in agreement with them.    I, Rita Ohara, am acting as scribe for [name], MD    I have reviewed this report as typed by the medical scribe, and it is complete and accurate.  Rolando Whitby Macarthur Critchley, MD

## 2021-05-31 ENCOUNTER — Encounter: Payer: Self-pay | Admitting: Oncology

## 2021-05-31 LAB — BASIC METABOLIC PANEL
BUN: 15 (ref 4–21)
CO2: 25 — AB (ref 13–22)
Chloride: 104 (ref 99–108)
Creatinine: 0.9 (ref 0.6–1.3)
Glucose: 128
Potassium: 3.8 (ref 3.4–5.3)
Sodium: 139 (ref 137–147)

## 2021-05-31 LAB — CBC AND DIFFERENTIAL
HCT: 45 (ref 41–53)
Hemoglobin: 14.8 (ref 13.5–17.5)
Neutrophils Absolute: 6.84
Platelets: 241 (ref 150–399)
WBC: 9.5

## 2021-05-31 LAB — CBC: RBC: 4.97 (ref 3.87–5.11)

## 2021-05-31 LAB — COMPREHENSIVE METABOLIC PANEL
Albumin: 3.9 (ref 3.5–5.0)
Calcium: 8.6 — AB (ref 8.7–10.7)

## 2021-05-31 LAB — HEPATIC FUNCTION PANEL
ALT: 27 (ref 10–40)
AST: 29 (ref 14–40)
Alkaline Phosphatase: 77 (ref 25–125)
Bilirubin, Total: 1.1

## 2021-06-03 ENCOUNTER — Other Ambulatory Visit: Payer: Self-pay | Admitting: Oncology

## 2021-06-03 ENCOUNTER — Inpatient Hospital Stay: Payer: 59 | Attending: Oncology | Admitting: Oncology

## 2021-06-03 ENCOUNTER — Telehealth: Payer: Self-pay | Admitting: Oncology

## 2021-06-03 VITALS — BP 124/72 | HR 55 | Temp 98.1°F | Resp 18 | Ht 72.0 in | Wt 291.2 lb

## 2021-06-03 DIAGNOSIS — C3411 Malignant neoplasm of upper lobe, right bronchus or lung: Secondary | ICD-10-CM | POA: Diagnosis not present

## 2021-06-03 NOTE — Telephone Encounter (Signed)
Per 9/12 LOS, patient scheduled March 2023 Appt's Gave patient Appt Summary/Order X-Ray Chest

## 2021-07-08 ENCOUNTER — Other Ambulatory Visit: Payer: Self-pay

## 2021-07-08 ENCOUNTER — Ambulatory Visit (INDEPENDENT_AMBULATORY_CARE_PROVIDER_SITE_OTHER): Payer: 59 | Admitting: Internal Medicine

## 2021-07-08 ENCOUNTER — Encounter: Payer: Self-pay | Admitting: Internal Medicine

## 2021-07-08 DIAGNOSIS — R911 Solitary pulmonary nodule: Secondary | ICD-10-CM | POA: Diagnosis not present

## 2021-07-08 DIAGNOSIS — J449 Chronic obstructive pulmonary disease, unspecified: Secondary | ICD-10-CM | POA: Diagnosis not present

## 2021-07-08 MED ORDER — ALBUTEROL SULFATE HFA 108 (90 BASE) MCG/ACT IN AERS: INHALATION_SPRAY | RESPIRATORY_TRACT | 2 refills | Status: DC

## 2021-07-08 NOTE — Assessment & Plan Note (Signed)
Quit smoking 2015 at wt = 170 lb  - 01/25/2019   Walked RA  2 laps @  approx 240ft each @ fast pace  stopped due to  [place] at wt = 276  - PFT's  07/01/2019  FEV1 3.16 (81 % ) ratio 0.78  p 7 % improvement from saba p nothing prior to study with DLCO wnl and f/v curve ok,  ERV 2% c/w effects of body habitus @ 285  - 07/08/2021   Walked on RA x  3  lap(s) =  approx  450 @ avg  pace, stopped due to end of sudy, mild sob p 2nd lap with lowest 02 sats 93%   Strongly suspect obesity/ deconditioning > limiting sob  At this point so rec  Sub max ex 30 min daily, check sats at peak ex and f/u 6 weeks with pfts   Ok to try albuterol 15 min before an activity (on alternating days)  that you know would usually make you short of breath and see if it makes any difference and if makes none then don't take albuterol after activity unless you can't catch your breath as this means it's the resting that helps, not the albuterol.       Each maintenance medication was reviewed in detail including emphasizing most importantly the difference between maintenance and prns and under what circumstances the prns are to be triggered using an action plan format where appropriate.  Total time for H and P, chart review, counseling, reviewing  Dpi/hfa/02  device(s) , directly observing portions of ambulatory 02 saturation study/ and generating customized AVS unique to this office visit / same day charting =  34 min

## 2021-07-08 NOTE — Patient Instructions (Addendum)
Plan A = Automatic = Always=  Continue trelegy one click am  - do this before your dental care - I would recommend arm and hammer for the tooth brushing part and make sluggy/ gargle after brushing   Plan B = Backup (to supplement plan A, not to replace it) Only use your albuterol inhaler as a rescue medication to be used if you can't catch your breath by resting or doing a relaxed purse lip breathing pattern.  - The less you use it, the better it will work when you need it. - Ok to use the inhaler up to 2 puffs  every 4 hours if you must but call for appointment if use goes up over your usual need - Don't leave home without it !!  (think of it like the spare tire for your car)    Ok to try albuterol 15 min before an activity (on alternating days)  that you know would usually make you short of breath and see if it makes any difference and if makes none then don't take albuterol after activity unless you can't catch your breath as this means it's the resting that helps, not the albuterol.  We will walk you today to see how you do at peak exercise - you can check your own 02 level with exercise to make to sure you are not losing ground.  Please schedule a follow up office visit in 6 weeks with pft's on return  - don't use any albuterol before you come in

## 2021-07-08 NOTE — Progress Notes (Signed)
Aaron [name], male    DOB: 06/17/1961,     MRN: [id number]   Brief patient profile:  60  yowm quit smoking 2015 at wt 170 with gradual wt gain up to 270 range but definitely noted doe assoc with this in 2019 gradually worse since onset so referred to pulmonary clinic 01/25/2019 by Aaron [name] with abn LDSCT 11/17/18 that did show 8.7 mm RUL nodule and centrilobular emphysema / lingular tree-in-bud      History of Present Illness  01/25/2019  Pulmonary/ 1st office eval/Aaron [name] / not on inhalers at present and not really wanting to start any  Chief Complaint  Patient presents with   Pulmonary Consult    Referred by Aaron Peace, NP. Pt c/o DOE for the past year. He gets winded walking up hill or stairs.   Dyspnea:  MMRC2 = can't walk a nl pace on a flat grade s sob but does fine slow and flat / one flight sob at top s stopping / still doing yardwork for 10 min Cough: none Sleep: on side / bed is flat/ one pillow  SABA use: none  Rec - repeat CT  05/19/19 increase RUL nodule to 15.6 mm  - PET 06/29/2019  The right upper lobe nodules highly hypermetabolic with maximum SUV of 11.2, most compatible with malignancy. No adenopathy or metastatic spread identified. 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 Aaron [name]) > no RT or chemo and f/b Aaron [name] in [facility]     07/08/2021  f/u ov/Aaron [name] re: emphysema on Ct/ MPN   maint on Telegy   Chief Complaint  Patient presents with   Follow-up    SOB: during exertion, has worsened   Dyspnea:  does fine on flat surface, has to go slower than others all day long = MMRC2 = can't walk a nl pace on a flat grade s sob but does fine slow and flat  - staying bout the same - [place] in [place] walks 30 - 45 min feels holding ground but not checking 02 levels Cough: none  Sleeping: flat bed one pillow  SABA use: none  02: none  Covid status:   vax x 3    No obvious day to day or daytime variability or assoc excess/ purulent sputum or mucus  plugs or hemoptysis or cp or chest tightness, subjective wheeze or overt sinus or hb symptoms.   Sleepy  without nocturnal  or early am exacerbation  of respiratory  c/o's or need for noct saba. Also denies any obvious fluctuation of symptoms with weather or environmental changes or other aggravating or alleviating factors except as outlined above   No unusual exposure hx or h/o childhood pna/ asthma or knowledge of premature birth.  Current Allergies, Complete Past Medical History, Past Surgical History, Family History, and Social History were reviewed in [employer] record.  ROS  The following are not active complaints unless bolded Hoarseness, sore throat, dysphagia, dental problems, itching, sneezing,  nasal congestion or discharge of excess mucus or purulent secretions, ear ache,   fever, chills, sweats, unintended wt loss or wt gain, classically pleuritic or exertional cp,  orthopnea pnd or arm/hand swelling  or leg swelling, presyncope, palpitations, abdominal pain, anorexia, nausea, vomiting, diarrhea  or change in bowel habits or change in bladder habits, change in stools or change in urine, dysuria, hematuria,  rash, arthralgias, visual complaints, headache, numbness, weakness or ataxia or problems with walking or coordination,  change in mood  or  memory.        Current Meds  Medication Sig   atorvastatin (LIPITOR) 10 MG tablet Take 10 mg by mouth every evening.    ELIQUIS 5 MG TABS tablet Take 5 mg by mouth 2 (two) times daily.   escitalopram (LEXAPRO) 10 MG tablet Take 10 mg by mouth every evening.    flecainide (TAMBOCOR) 150 MG tablet Take 150 mg by mouth 2 (two) times daily.   metoprolol succinate (TOPROL-XL) 25 MG 24 hr tablet Take 1 tablet (25 mg total) by mouth 2 (two) times daily.   temazepam (RESTORIL) 30 MG capsule Take 30 mg by mouth at bedtime.   TRELEGY ELLIPTA 100-62.5-25 MCG/INH AEPB Inhale 1 puff into the lungs daily.            Objective:        Wt Readings from Last 3 Encounters:  07/08/21 292 lb 12.8 oz (132.8 kg)  06/03/21 291 lb 3.2 oz (132.1 kg)  04/11/21 292 lb (132.5 kg)      Vital signs reviewed  07/08/2021  - Note at rest 02 sats  97% on RA   General appearance:    obese pleasant amb wm nad / mild pseudowheeze over trachea      HEENT : pt wearing mask not removed for exam due to covid - 19 concerns.    NECK :  without JVD/Nodes/TM/ nl carotid upstrokes bilaterally   LUNGS: no acc muscle use,  Mild barrel  contour chest wall with bilateral  Distant bs s audible wheeze and  without cough on insp or exp maneuvers  and mild  Hyperresonant  to  percussion bilaterally     CV:  RRR  no s3 or murmur or increase in P2, and no edema   ABD:  soft and nontender with pos end  insp Hoover's  in the supine position. No bruits or organomegaly appreciated, bowel sounds nl  MS:   Nl gait/  ext warm without deformities, calf tenderness, cyanosis or clubbing No obvious joint restrictions   SKIN: warm and dry without lesions    NEURO:  alert, approp, nl sensorium with  no motor or cerebellar deficits apparent.           Assessment

## 2021-07-08 NOTE — Assessment & Plan Note (Signed)
LDSCT 11/17/18 that did show 8.7 mm RUL nodule and centrilobular emphysema / lingular tree-in-bud  - repeat CT  05/19/19 increase RUL nodule to 15.6 mm  - PET 06/29/2019  The right upper lobe nodules highly hypermetabolic with maximum SUV of 11.2, most compatible with malignancy. No adenopathy or metastatic spread identified. 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 [place])

## 2021-07-10 ENCOUNTER — Other Ambulatory Visit: Payer: Self-pay | Admitting: Cardiology

## 2021-07-10 DIAGNOSIS — I480 Paroxysmal atrial fibrillation: Secondary | ICD-10-CM

## 2021-07-10 DIAGNOSIS — I2510 Atherosclerotic heart disease of native coronary artery without angina pectoris: Secondary | ICD-10-CM

## 2021-08-09 ENCOUNTER — Other Ambulatory Visit: Payer: Self-pay | Admitting: Cardiology

## 2021-08-29 ENCOUNTER — Encounter: Payer: Self-pay | Admitting: Internal Medicine

## 2021-08-29 ENCOUNTER — Ambulatory Visit (INDEPENDENT_AMBULATORY_CARE_PROVIDER_SITE_OTHER): Payer: 59 | Admitting: Internal Medicine

## 2021-08-29 ENCOUNTER — Other Ambulatory Visit: Payer: Self-pay

## 2021-08-29 DIAGNOSIS — J449 Chronic obstructive pulmonary disease, unspecified: Secondary | ICD-10-CM

## 2021-08-29 DIAGNOSIS — R911 Solitary pulmonary nodule: Secondary | ICD-10-CM | POA: Diagnosis not present

## 2021-08-29 LAB — PULMONARY FUNCTION TEST
DL/VA % pred: 94 %
DL/VA: 3.94 ml/min/mmHg/L
DLCO cor % pred: 67 %
DLCO cor: 19.62 ml/min/mmHg
DLCO unc % pred: 67 %
DLCO unc: 19.62 ml/min/mmHg
FEF 25-75 Post: 3.06 L/s
FEF 25-75 Pre: 1.97 L/s
FEF2575-%Change-Post: 55 %
FEF2575-%Pred-Post: 100 %
FEF2575-%Pred-Pre: 64 %
FEV1-%Change-Post: 16 %
FEV1-%Pred-Post: 74 %
FEV1-%Pred-Pre: 64 %
FEV1-Post: 2.84 L
FEV1-Pre: 2.44 L
FEV1FVC-%Change-Post: 0 %
FEV1FVC-%Pred-Pre: 97 %
FEV6-%Change-Post: 15 %
FEV6-%Pred-Post: 79 %
FEV6-%Pred-Pre: 68 %
FEV6-Post: 3.84 L
FEV6-Pre: 3.32 L
FEV6FVC-%Pred-Post: 104 %
FEV6FVC-%Pred-Pre: 104 %
FVC-%Change-Post: 15 %
FVC-%Pred-Post: 76 %
FVC-%Pred-Pre: 65 %
FVC-Post: 3.84 L
FVC-Pre: 3.32 L
Post FEV1/FVC ratio: 74 %
Post FEV6/FVC ratio: 100 %
Pre FEV1/FVC ratio: 73 %
Pre FEV6/FVC Ratio: 100 %
RV % pred: 95 %
RV: 2.29 L
TLC % pred: 83 %
TLC: 6.15 L

## 2021-08-29 MED ORDER — BUDESONIDE-FORMOTEROL FUMARATE 80-4.5 MCG/ACT IN AERO: INHALATION_SPRAY | RESPIRATORY_TRACT | 12 refills | Status: DC

## 2021-08-29 NOTE — Assessment & Plan Note (Signed)
PFTs 08/29/2021 with ERV 3%   C/w effects of obesity on lung volumes > rec wt loss / reconditioning           Each maintenance medication was reviewed in detail including emphasizing most importantly the difference between maintenance and prns and under what circumstances the prns are to be triggered using an action plan format where appropriate.  Total time for H and P, chart review, counseling, reviewing hfa device(s) and generating customized AVS unique to this summary final office visit / same day charting = 30 min

## 2021-08-29 NOTE — Assessment & Plan Note (Signed)
Quit smoking 2015 at wt = 170 lb  - 01/25/2019   Walked RA  2 laps @  approx 270ft each @ fast pace  stopped due to  [place] at wt = 276  - PFT's  07/01/2019  FEV1 3.16 (81 % ) ratio 0.78  p 7 % improvement from saba p nothing prior to study with DLCO wnl and f/v curve ok,  ERV 2% c/w effects of body habitus @ 285  - 07/08/2021   Walked on RA x  3  lap(s) =  approx  450 @ avg  pace, stopped due to end of sudy, mild sob p 2nd lap with lowest 02 sats 93%  - PFT's  08/29/2021  FEV1 2.84 (74 % ) ratio 0.74  p 16 % improvement from saba p 0 prior to study with DLCO  19.62 (67%) corrects to 3.94 (94%)  for alv volume and FV curve a bit jagged esp on insp but no significant obst    He is more AB than copd so rec symbicort 80 2bid and f/u prn  - The proper method of use, as well as anticipated side effects, of a metered-dose inhaler were discussed and demonstrated to the patient using teach back method.

## 2021-08-29 NOTE — Progress Notes (Signed)
Full PFT performed today. °

## 2021-08-29 NOTE — Patient Instructions (Signed)
Full PFT performed today. °

## 2021-08-29 NOTE — Patient Instructions (Signed)
Plan A = Automatic = Always=   start Symbicort 80 Take 2 puffs first thing in am and then another 2 puffs about 12 hours later.    Work on inhaler technique:  relax and gently blow all the way out then take a nice smooth full deep breath back in, triggering the inhaler at same time you start breathing in.  Hold for up to 5 seconds if you can. Blow out thru nose. Rinse and gargle with water when done.  If mouth or throat bother you at all,  try brushing teeth/gums/tongue with arm and hammer toothpaste/ make a slurry and gargle and spit out.       Plan B = Backup (to supplement plan A, not to replace it) Only use your albuterol inhaler as a rescue medication to be used if you can't catch your breath by resting or doing a relaxed purse lip breathing pattern.  - The less you use it, the better it will work when you need it. - Ok to use the inhaler up to 2 puffs  every 4 hours if you must but call for appointment if use goes up over your usual need - Don't leave home without it !!  (think of it like the spare tire for your car)    Ok to try albuterol 15 min before an activity (on alternating days)  that you know would usually make you short of breath and see if it makes any difference and if makes none then don't take albuterol after activity unless you can't catch your breath as this means it's the resting that helps, not the albuterol.   To get the most out of exercise, you need to be continuously aware that you are short of breath, but never out of breath, for at least 30 minutes daily. As you improve, it will actually be easier for you to do the same amount of exercise  in  30 minutes so always push to the level where you are short of breath.   Make sure you check your oxygen saturations at highest level of activity   Pulmonary follow up is as needed

## 2021-08-29 NOTE — Progress Notes (Signed)
Aaron [name], male    DOB: 09-22-1961,     MRN: [id number]   Brief patient profile:  60  yowm quit smoking 2015 at wt 170 with gradual wt gain up to 270 range but definitely noted doe assoc with this in 2019 gradually worse since onset so referred to pulmonary clinic 01/25/2019 by Laverna Peace with abn LDSCT 11/17/18 that did show 8.7 mm RUL nodule and centrilobular emphysema / lingular tree-in-bud      History of Present Illness  01/25/2019  Pulmonary/ 1st office eval/Aaron [name] / not on inhalers at present and not really wanting to start any  Chief Complaint  Patient presents with   Pulmonary Consult    Referred by Laverna Peace, NP. Pt c/o DOE for the past year. He gets winded walking up hill or stairs.   Dyspnea:  MMRC2 = can't walk a nl pace on a flat grade s sob but does fine slow and flat / one flight sob at top s stopping / still doing yardwork for 10 min Cough: none Sleep: on side / bed is flat/ one pillow  SABA use: none  Rec - repeat CT  05/19/19 increase RUL nodule to 15.6 mm  - PET 06/29/2019  The right upper lobe nodules highly hypermetabolic with maximum SUV of 11.2, most compatible with malignancy. No adenopathy or metastatic spread identified. 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 [facility]) > no RT or chemo and f/b Dr Bobby Rumpf in [facility]     07/08/2021  f/u ov/Aaron [name] re: emphysema on Ct/ MPN   maint on Telegy   Chief Complaint  Patient presents with   Follow-up    SOB: during exertion, has worsened   Dyspnea:  does fine on flat surface, has to go slower than others all day long = MMRC2 = can't walk a nl pace on a flat grade s sob but does fine slow and flat  - staying bout the same - [place] in [place] walks 30 - 45 min feels holding ground but not checking 02 levels Cough: none  Sleeping: flat bed one pillow  SABA use: none  02: none  Covid status:   vax x 3   Rec Plan A = Automatic = Always=  Continue trelegy one click am   Plan B = Backup (to  supplement plan A, not to replace it) Only use your albuterol inhaler as a rescue medication  Ok to try albuterol 15 min before an activity (on alternating days)   Please schedule a follow up office visit in 6 weeks with pft's on return  - don't use any albuterol before you come in   08/29/2021  f/u ov/Aaron [name] re: GOLD 0/spn   maint on trelegy   Chief Complaint  Patient presents with   Follow-up    PFT's done today. Breathing is overall doing well. He rarely uses his rescue inhaler.   Dyspnea:  shop to house sob and it's slightly up hill  Cough: none  Sleeping: flat bed / one pillow SABA use: avg once every 2 weeks  02: none/ not checking sats  Covid status: vax x 3 / summer 2022 omicron   No obvious day to day or daytime variability or assoc excess/ purulent sputum or mucus plugs or hemoptysis or cp or chest tightness, subjective wheeze or overt sinus or hb symptoms.   Sleeping  without nocturnal  or early am exacerbation  of respiratory  c/o's or need for noct saba. Also denies any  obvious fluctuation of symptoms with weather or environmental changes or other aggravating or alleviating factors except as outlined above   No unusual exposure hx or h/o childhood pna/ asthma or knowledge of premature birth.  Current Allergies, Complete Past Medical History, Past Surgical History, Family History, and Social History were reviewed in [employer] record.  ROS  The following are not active complaints unless bolded Hoarseness, sore throat, dysphagia, dental problems, itching, sneezing,  nasal congestion or discharge of excess mucus or purulent secretions, ear ache,   fever, chills, sweats, unintended wt loss or wt gain, classically pleuritic or exertional cp,  orthopnea pnd or arm/hand swelling  or leg swelling, presyncope, palpitations, abdominal pain, anorexia, nausea, vomiting, diarrhea  or change in bowel habits or change in bladder habits, change in stools or change in  urine, dysuria, hematuria,  rash, arthralgias, visual complaints, headache, numbness, weakness or ataxia or problems with walking or coordination,  change in mood or  memory.        Current Meds  Medication Sig   albuterol (PROAIR HFA) 108 (90 Base) MCG/ACT inhaler 2 puffs every 4 hours as needed only  if your can't catch your breath   atorvastatin (LIPITOR) 10 MG tablet Take 10 mg by mouth every evening.    ELIQUIS 5 MG TABS tablet Take 5 mg by mouth 2 (two) times daily.   escitalopram (LEXAPRO) 10 MG tablet Take 10 mg by mouth every evening.    flecainide (TAMBOCOR) 150 MG tablet TAKE ONE TABLET BY MOUTH TWICE DAILY   metoprolol succinate (TOPROL-XL) 25 MG 24 hr tablet TAKE ONE TABLET BY MOUTH TWICE DAILY   temazepam (RESTORIL) 30 MG capsule Take 30 mg by mouth at bedtime.   TRELEGY ELLIPTA 100-62.5-25 MCG/INH AEPB Inhale 1 puff into the lungs daily.         Objective:    wts    08/29/2021      290   07/08/21 292 lb 12.8 oz (132.8 kg)  06/03/21 291 lb 3.2 oz (132.1 kg)  04/11/21 292 lb (132.5 kg)    Vital signs reviewed  08/29/2021  - Note at rest 02 sats  96% on RA   General appearance:    obese wm nad     HEENT : pt wearing mask not removed for exam due to covid -19 concerns.    NECK :  without JVD/Nodes/TM/ nl carotid upstrokes bilaterally   LUNGS: no acc muscle use,  Nl contour chest which is clear to A and P bilaterally without cough on insp or exp maneuvers   CV:  RRR  no s3 or murmur or increase in P2, and no edema   ABD:  obese soft and nontender with nl inspiratory excursion in the supine position. No bruits or organomegaly appreciated, bowel sounds nl  MS:  Nl gait/ ext warm without deformities, calf tenderness, cyanosis or clubbing No obvious joint restrictions   SKIN: warm and dry without lesions    NEURO:  alert, approp, nl sensorium with  no motor or cerebellar deficits apparent.             Assessment

## 2021-10-16 ENCOUNTER — Ambulatory Visit: Payer: 59 | Admitting: Cardiology

## 2021-10-16 ENCOUNTER — Encounter: Payer: Self-pay | Admitting: Cardiology

## 2021-10-16 ENCOUNTER — Other Ambulatory Visit: Payer: Self-pay

## 2021-10-16 VITALS — BP 120/70 | HR 50 | Ht 72.0 in | Wt 294.0 lb

## 2021-10-16 DIAGNOSIS — I480 Paroxysmal atrial fibrillation: Secondary | ICD-10-CM

## 2021-10-16 DIAGNOSIS — E782 Mixed hyperlipidemia: Secondary | ICD-10-CM

## 2021-10-16 DIAGNOSIS — I2510 Atherosclerotic heart disease of native coronary artery without angina pectoris: Secondary | ICD-10-CM | POA: Diagnosis not present

## 2021-10-16 NOTE — Progress Notes (Signed)
Cardiology Office Note:    Date:  10/16/2021   ID:  Aaron [name], DOB 11-15-1960, MRN [id number]  PCP:  Aaron Dandy, NP  Cardiologist:  Aaron Lindau, MD   Referring MD: Aaron Dandy, NP    ASSESSMENT:    1. Coronary artery calcification seen on CAT scan   2. PAF (paroxysmal atrial fibrillation) ([place])   3. Mixed dyslipidemia   4. Morbid obesity ([place])    PLAN:    In order of problems listed above:  Coronary artery calcification: Secondary prevention stressed with the patient.  Importance of compliance with diet medication stressed and he vocalized understanding.  He was advised to walk at least half an hour a day 5 days a week and he promises to do so. Paroxysmal atrial fibrillation:I discussed with the patient atrial fibrillation, disease process. Management and therapy including rate and rhythm control, anticoagulation benefits and potential risks were discussed extensively with the patient. Patient had multiple questions which were answered to patient's satisfaction Mixed dyslipidemia: Diet was emphasized.  Lifestyle modification urged and he promises to do better. Essential hypertension: Blood pressure stable and diet was emphasized. Morbid obesity: Weight loss was discussed and patient is planning a weight loss group which is also being guided by a pharmacist.  He seems to be motivated to losing weight. Patient will be seen in follow-up appointment in 6 months or earlier if the patient has any concerns    Medication Adjustments/Labs and Tests Ordered: Current medicines are reviewed at length with the patient today.  Concerns regarding medicines are outlined above.  No orders of the defined types were placed in this encounter.  No orders of the defined types were placed in this encounter.    Chief Complaint  Patient presents with   Follow-up     History of Present Illness:    Aaron [name] is a 61 y.o. male.  Patient has past medical history of coronary  artery calcification, essential hypertension, dyslipidemia, morbid obesity and paroxysmal atrial fibrillation.  He denies any problems at this time and takes care of activities of daily living.  No chest pain orthopnea or PND.  At the time of my evaluation, the patient is alert awake oriented and in no distress.  Past Medical History:  Diagnosis Date   Anginal equivalent ([place]) 10/27/2019   Anxiety    Arrhythmia    Complication of anesthesia    COPD  GOLD 0 = CT criteria only  01/26/2019   Quit smoking 2015 at wt = 170 lb  - 01/25/2019   Walked RA  2 laps @  approx 275ft each @ fast pace  stopped due to  [place] at wt = 276  - PFT's  07/01/2019  FEV1 3.16 (81 % ) ratio 0.78  p 7 % improvement from saba p nothing prior to study with DLCO wnl and f/v curve ok,  ERV 2% c/w effects of body habitus   Coronary artery calcification seen on CAT scan 05/30/2020   Dysrhythmia    afib   Insomnia 02/16/2019   Malignant 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 ([place]) 08/12/2019   Mixed dyslipidemia    Morbid obesity ([place]) 05/30/2020   Obesity (BMI 35.0-39.9 without comorbidity) 11/29/2020   PAF (paroxysmal atrial fibrillation) ([place]) 07/27/2019   Paroxysmal atrial fibrillation ([facility])    PONV (postoperative nausea and vomiting)    S/P lobectomy of lung 08/12/2019   Solitary pulmonary nodule on lung CT 01/26/2019    LDSCT  11/17/18 that did show 8.7 mm RUL nodule and centrilobular emphysema / lingular tree-in-bud  - repeat CT  05/19/19 increase RUL nodule to 15.6 mm  - PET 06/29/2019  The right upper lobe nodules highly hypermetabolic with maximum SUV of 11.2, most compatible with malignancy. No adenopathy or metastatic spread identified. 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 ([place]    Past Surgical History:  Procedure Laterality Date   BACK SURGERY     CARDIOVERSION N/A 07/15/2019   Procedure: CARDIOVERSION;  Surgeon: Pixie Casino, MD;  Location: [facility];  Service: Cardiovascular;   Laterality: N/A;   CHOLECYSTECTOMY     TEE WITH CARDIOVERSION     TEE WITHOUT CARDIOVERSION N/A 07/15/2019   Procedure: TRANSESOPHAGEAL ECHOCARDIOGRAM (TEE);  Surgeon: Pixie Casino, MD;  Location: [facility];  Service: Cardiovascular;  Laterality: N/A;   [place] (VATS)/ LOBECTOMY  08/12/2019   Procedure: RIGHT Video Assisted Thoracoscopy (Vats) With Right Upper Lobectomy;  Surgeon: Melrose Nakayama, MD;  Location: [facility] OR;  Service: Thoracic;;    Current Medications: Current Meds  Medication Sig   albuterol (PROAIR HFA) 108 (90 Base) MCG/ACT inhaler 2 puffs every 4 hours as needed only  if your can't catch your breath   atorvastatin (LIPITOR) 10 MG tablet Take 10 mg by mouth every evening.    budesonide-formoterol (SYMBICORT) 80-4.5 MCG/ACT inhaler Take 2 puffs first thing in am and then another 2 puffs about 12 hours later.   ELIQUIS 5 MG TABS tablet Take 5 mg by mouth 2 (two) times daily.   escitalopram (LEXAPRO) 10 MG tablet Take 10 mg by mouth every evening.    flecainide (TAMBOCOR) 150 MG tablet TAKE ONE TABLET BY MOUTH TWICE DAILY   metoprolol succinate (TOPROL-XL) 25 MG 24 hr tablet TAKE ONE TABLET BY MOUTH TWICE DAILY   temazepam (RESTORIL) 30 MG capsule Take 30 mg by mouth at bedtime.     Allergies:   Penicillins and Codeine   Social History   Socioeconomic History   Marital status: Married    Spouse name: Not on file   Number of children: Not on file   Years of education: Not on file   Highest education level: Not on file  Occupational History   Not on file  Tobacco Use   Smoking status: Former    Packs/day: 1.50    Years: 40.00    Pack years: 60.00    Types: Cigarettes    Quit date: 09/22/2013    Years since quitting: 8.0   Smokeless tobacco: Never  Vaping Use   Vaping Use: Never used  Substance and Sexual Activity   Alcohol use: Not Currently   Drug use: Not Currently   Sexual activity: Not on file  Other Topics Concern   Not on  file  Social History Narrative   Not on file   Social Determinants of Health   Financial Resource Strain: Not on file  Food Insecurity: Not on file  Transportation Needs: Not on file  Physical Activity: Not on file  Stress: Not on file  Social Connections: Not on file     Family History: The patient's family history includes Lung cancer in his brother, father, mother, and sister.  ROS:   Please see the history of present illness.    All other systems reviewed and are negative.  EKGs/Labs/Other Studies Reviewed:    The following studies were reviewed today: EKG reveals sinus rhythm QRS within normal limits and nonspecific  ST-T changes   Recent Labs: 11/29/2020: TSH 2.020 05/31/2021: ALT 27; BUN 15; Creatinine 0.9; Hemoglobin 14.8; Platelets 241; Potassium 3.8; Sodium 139  Recent Lipid Panel    Component Value Date/Time   CHOL 139 11/29/2020 0837   TRIG 95 11/29/2020 0837   HDL 34 (L) 11/29/2020 0837   CHOLHDL 4.1 11/29/2020 0837   LDLCALC 87 11/29/2020 0837    Physical Exam:    VS:  BP 120/70 (BP Location: Left Arm, Patient Position: Sitting, Cuff Size: Normal)    Pulse (!) 50    Ht 6' (1.829 m)    Wt 294 lb (133.4 kg)    SpO2 95%    BMI 39.87 kg/m     Wt Readings from Last 3 Encounters:  10/16/21 294 lb (133.4 kg)  08/29/21 290 lb (131.5 kg)  07/08/21 292 lb 12.8 oz (132.8 kg)     GEN: Patient is in no acute distress HEENT: Normal NECK: No JVD; No carotid bruits LYMPHATICS: No lymphadenopathy CARDIAC: Hear sounds regular, 2/6 systolic murmur at the apex. RESPIRATORY:  Clear to auscultation without rales, wheezing or rhonchi  ABDOMEN: Soft, non-tender, non-distended MUSCULOSKELETAL:  No edema; No deformity  SKIN: Warm and dry NEUROLOGIC:  Alert and oriented x 3 PSYCHIATRIC:  Normal affect   Signed, Aaron Lindau, MD  10/16/2021 2:59 PM    [place]

## 2021-10-16 NOTE — Patient Instructions (Signed)

## 2021-11-26 NOTE — Progress Notes (Incomplete)
[facility]  [address] [place],  [place]  [address] [phone]  Clinic Day:  11/26/2021  Referring physician: Lowella Dandy, NP  This document serves as a record of services personally performed by [name], MD. It was created on their behalf by Curry,Lauren E, a trained medical scribe. The creation of this record is based on the scribe's personal observations and the provider's statements to them.  HISTORY OF PRESENT ILLNESS:  The patient is a 61 y.o. male with stage IA3 (T1c N0 M0) lung adenocarcinoma, status post a right upper lobectomy in November 2020.  He comes in today to go over his chest x-ray to ensure there remains no evidence of early disease recurrence.  Since his last visit, the patient has been doing well.  He has chronic shortness of breath, which has been an intermittent issue since his lung cancer surgery.  He denies having other respiratory symptoms which concern him for disease recurrence.    PHYSICAL EXAM:  There were no vitals taken for this visit. Wt Readings from Last 3 Encounters:  10/16/21 294 lb (133.4 kg)  08/29/21 290 lb (131.5 kg)  07/08/21 292 lb 12.8 oz (132.8 kg)   There is no height or weight on file to calculate BMI. Performance status (ECOG): 0 Physical Exam Constitutional:      Appearance: Normal appearance. He is not ill-appearing.  HENT:     Mouth/Throat:     Mouth: Mucous membranes are moist.     Pharynx: Oropharynx is clear. No oropharyngeal exudate or posterior oropharyngeal erythema.  Cardiovascular:     Rate and Rhythm: Normal rate and regular rhythm.     Heart sounds: No murmur heard.   No friction rub. No gallop.  Pulmonary:     Effort: Pulmonary effort is normal. No respiratory distress.     Breath sounds: Normal breath sounds. No wheezing, rhonchi or rales.  Abdominal:     General: Bowel sounds are normal. There is no distension.     Palpations: Abdomen is soft. There is no mass.      Tenderness: There is no abdominal tenderness.  Musculoskeletal:        General: No swelling.     Right lower leg: No edema.     Left lower leg: No edema.  Lymphadenopathy:     Cervical: No cervical adenopathy.     Upper Body:     Right upper body: No supraclavicular or axillary adenopathy.     Left upper body: No supraclavicular or axillary adenopathy.     Lower Body: No right inguinal adenopathy. No left inguinal adenopathy.  Skin:    General: Skin is warm.     Coloration: Skin is not jaundiced.     Findings: No lesion or rash.  Neurological:     General: No focal deficit present.     Mental Status: He is alert and oriented to person, place, and time. Mental status is at baseline.  Psychiatric:        Mood and Affect: Mood normal.        Behavior: Behavior normal.        Thought Content: Thought content normal.   SCANS:  His chest X-RAY revealed the following: ***  ASSESSMENT & PLAN:  Assessment/Plan:  A 61 y.o. male with stage IA3 (T1c N0 M0) lung adenocarcinoma, status post a right upper lobectomy in November 2020.   In clinic today, I went over his chest x-ray images with him,  for which he could see he remains disease free. From a lung cancer perspective, the patient is doing very well.  As he has persistent shortness of breath, I will have him see pulmonology in [facility] for further evaluation.  Otherwise, I will see him back in 6 months for repeat clinical assessment, with a chest CT being done on the day of his next visit for his continued radiographic lung cancer surveillance.  The patient understands all the plans discussed today and is in agreement with them.    I, Rita Ohara, am acting as scribe for [name], MD    I have reviewed this report as typed by the medical scribe, and it is complete and accurate.  Dequincy Macarthur Critchley, MD

## 2021-12-02 ENCOUNTER — Inpatient Hospital Stay: Payer: 59 | Attending: Oncology | Admitting: Oncology

## 2021-12-02 ENCOUNTER — Other Ambulatory Visit: Payer: Self-pay | Admitting: Oncology

## 2021-12-02 ENCOUNTER — Encounter: Payer: Self-pay | Admitting: Oncology

## 2021-12-02 ENCOUNTER — Other Ambulatory Visit: Payer: Self-pay

## 2021-12-02 VITALS — BP 161/72 | HR 51 | Temp 97.6°F | Resp 18 | Ht 72.0 in | Wt 296.9 lb

## 2021-12-02 DIAGNOSIS — C3411 Malignant neoplasm of upper lobe, right bronchus or lung: Secondary | ICD-10-CM

## 2021-12-04 ENCOUNTER — Encounter: Payer: Self-pay | Admitting: Oncology

## 2021-12-05 ENCOUNTER — Encounter: Payer: Self-pay | Admitting: Oncology

## 2021-12-05 NOTE — Progress Notes (Signed)
?Aaron [name]  ?[address] ?[place],  [place]  [address] ?[phone] ? ?Clinic Day:  12/06/2021 ? ?Referring physician: Lowella Dandy, NP ? ?This document serves as a record of services personally performed by Aaron Potter, MD. It was created on their behalf by Curry,Lauren E, a trained medical scribe. The creation of this record is based on the scribe's personal observations and the provider's statements to them. ? ?HISTORY OF PRESENT ILLNESS:  ?The patient is a 61 y.o. male with stage IA3 (T1c N0 M0) lung adenocarcinoma, status post a right upper lobectomy in November 2020.  He comes in today to go over his chest CT as his chest x-ray done last week showed nodules of uncertain etiology.   Since his last visit, the patient has been doing okay.  He does have occasional spells of shortness of breath.  He recalls having one episode with exertion and another episode while just waking up in the morning.  He does have a history of panic attacks.  He also has had atrial fibrillation which required cardioversion.  He denies having other respiratory symptoms which concern him for disease recurrence.   ? ?PHYSICAL EXAM:  ?Blood pressure (!) 141/76, pulse (!) 48, temperature 97.9 ?F (36.6 ?C), resp. rate 18, height 6' (1.829 m), weight 296 lb 4.8 oz (134.4 kg), SpO2 97 %. ?Wt Readings from Last 3 Encounters:  ?12/06/21 296 lb 4.8 oz (134.4 kg)  ?12/02/21 296 lb 14.4 oz (134.7 kg)  ?10/16/21 294 lb (133.4 kg)  ? ?Body mass index is 40.19 kg/m?Marland Kitchen ?Performance status (ECOG): 0 ?Physical Exam ?Constitutional:   ?   Appearance: Normal appearance. He is not ill-appearing.  ?HENT:  ?   Mouth/Throat:  ?   Mouth: Mucous membranes are moist.  ?   Pharynx: Oropharynx is clear. No oropharyngeal exudate or posterior oropharyngeal erythema.  ?Cardiovascular:  ?   Rate and Rhythm: Normal rate and regular rhythm.  ?   Heart sounds: No murmur heard. ?  No friction rub. No gallop.  ?Pulmonary:  ?   Effort:  Pulmonary effort is normal. No respiratory distress.  ?   Breath sounds: Normal breath sounds. No wheezing, rhonchi or rales.  ?Abdominal:  ?   General: Bowel sounds are normal. There is no distension.  ?   Palpations: Abdomen is soft. There is no mass.  ?   Tenderness: There is no abdominal tenderness.  ?Musculoskeletal:     ?   General: No swelling.  ?   Right lower leg: No edema.  ?   Left lower leg: No edema.  ?Lymphadenopathy:  ?   Cervical: No cervical adenopathy.  ?   Upper Body:  ?   Right upper body: No supraclavicular or axillary adenopathy.  ?   Left upper body: No supraclavicular or axillary adenopathy.  ?   Lower Body: No right inguinal adenopathy. No left inguinal adenopathy.  ?Skin: ?   General: Skin is warm.  ?   Coloration: Skin is not jaundiced.  ?   Findings: No lesion or rash.  ?Neurological:  ?   General: No focal deficit present.  ?   Mental Status: He is alert and oriented to person, place, and time. Mental status is at baseline.  ?Psychiatric:     ?   Mood and Affect: Mood normal.     ?   Behavior: Behavior normal.     ?   Thought Content: Thought content normal.  ? ?SCANS:  His chest CT revealed the following: ?FINDINGS: ?Cardiovascular: Heart size is normal without pericardial effusion. ?Three-vessel coronary artery disease as before. Aortic caliber is ?normal with scattered aortic atherosclerosis both calcified and ?noncalcified. ? ?Central pulmonary vasculature is normal caliber. Distortion of RIGHT ?hilum following partial lung resection. ? ?Mediastinum/Nodes: No adenopathy in the chest. Esophagus grossly ?normal. ? ?Lungs/Pleura: No acute findings relative to imaged portions the ?liver, biliary tree, pancreas, spleen, adrenal glands or kidneys. ?Post cholecystectomy. Visualized gastrointestinal tract is ?unremarkable to the extent evaluated in the upper abdomen. No upper ?abdominal lymphadenopathy. ? ?Musculoskeletal: No acute bone finding. No destructive bone process. ?Spinal  degenerative changes. Similar appearance of focal herniation ?of RIGHT middle lobe into the RIGHT third intercostal space. ? ?IMPRESSION: ?1. Signs of RIGHT upper lobectomy without new or progressive ?findings. No signs of metastatic disease to the chest. ?2. Aortic atherosclerosis. ?3. Three-vessel coronary artery disease as before. ? ?Aortic Atherosclerosis (ICD10-I70.0). ? ?ASSESSMENT & PLAN:  ?Assessment/Plan:  [name] with stage IA3 (T1c N0 M0) lung adenocarcinoma, status post a right upper lobectomy in November 2020.   In clinic today, I went over his chest CT images with him, for which he could see there are no findings which suggest disease recurrence.  From a lung cancer perspective, the patient remains disease-free.  My concern is that his shortness of breath is multifactorial.  There may be both a cardiopulmonary and psychiatric component present.  Of note, he was recently seen by pulmonology in [facility] for which they did not appreciate anything ominous from a pulmonary standpoint.  He knows to speak with his primary care provider if these episodes of dyspnea persist.   Otherwise, I will see him back in 6 months for repeat clinical assessment, with a chest x-ray being done on the day of his next visit for his continued radiographic lung cancer surveillance.  The patient understands all the plans discussed today and is in agreement with them.  ? ? ?I, Rita Ohara, am acting as scribe for Aaron Potter, MD   ? ?I have reviewed this report as typed by the medical scribe, and it is complete and accurate. ? ?Matraca Hunkins Macarthur Critchley, MD ? ? ? ?  ?

## 2021-12-06 ENCOUNTER — Inpatient Hospital Stay: Payer: 59 | Admitting: Oncology

## 2021-12-06 VITALS — BP 141/76 | HR 48 | Temp 97.9°F | Resp 18 | Ht 72.0 in | Wt 296.3 lb

## 2021-12-06 DIAGNOSIS — C3411 Malignant neoplasm of upper lobe, right bronchus or lung: Secondary | ICD-10-CM | POA: Diagnosis not present

## 2022-02-06 ENCOUNTER — Emergency Department (HOSPITAL_BASED_OUTPATIENT_CLINIC_OR_DEPARTMENT_OTHER)
Admission: EM | Admit: 2022-02-06 | Discharge: 2022-02-06 | Disposition: A | Discharge status: Home / Self Care | Payer: 59 | Attending: Emergency Medicine | Admitting: Emergency Medicine

## 2022-02-06 ENCOUNTER — Other Ambulatory Visit: Payer: Self-pay

## 2022-02-06 DIAGNOSIS — Z7951 Long term (current) use of inhaled steroids: Secondary | ICD-10-CM | POA: Diagnosis not present

## 2022-02-06 DIAGNOSIS — I2510 Atherosclerotic heart disease of native coronary artery without angina pectoris: Secondary | ICD-10-CM | POA: Diagnosis not present

## 2022-02-06 DIAGNOSIS — M25561 Pain in right knee: Secondary | ICD-10-CM

## 2022-02-06 DIAGNOSIS — J449 Chronic obstructive pulmonary disease, unspecified: Secondary | ICD-10-CM | POA: Diagnosis not present

## 2022-02-06 DIAGNOSIS — M25461 Effusion, right knee: Secondary | ICD-10-CM | POA: Insufficient documentation

## 2022-02-06 DIAGNOSIS — Z7901 Long term (current) use of anticoagulants: Secondary | ICD-10-CM | POA: Diagnosis not present

## 2022-02-06 NOTE — Discharge Instructions (Addendum)
You were seen in the emergency department for right knee pain.    I have greater suspicion for injuries to the soft tissue in your knee, including your ligaments or meniscus.  I think that you need an MRI to rule out the soft tissue injuries.  As we discussed I think calling your primary doctor is a great idea.  Make sure to call her in the morning and hopefully she can go ahead and order the MRI for you.  I have attached the contact information for an orthopedic provider that we use, but your primary care can send a referral as well.  Continue to monitor how you're doing and return to the ER for new or worsening symptoms.

## 2022-02-06 NOTE — ED Provider Notes (Signed)
[facility] EMERGENCY DEPARTMENT Provider Note   CSN: [id number] Arrival date & time: 02/06/22  1541     History  Chief Complaint  Patient presents with   Knee Injury    Aaron [name] is a 61 y.o. male who presents emergency department with right knee pain.  Patient states that he has been having problems with that knee for the past 3 months or so, but when he was getting up in his [place] earlier today he felt "something rip inside his knee".  He denies any fall, or hitting the knee on anything.  He states that since then he has been unable to put pressure on that leg.  No medications taken prior to arrival.  No numbness.  HPI     Home Medications Prior to Admission medications   Medication Sig Start Date End Date Taking? Authorizing Provider  albuterol (PROAIR HFA) 108 (90 Base) MCG/ACT inhaler 2 puffs every 4 hours as needed only  if your can't catch your breath 07/08/21   Tanda Rockers, MD  atorvastatin (LIPITOR) 10 MG tablet Take 10 mg by mouth every evening.  12/31/18   [provider]  budesonide-formoterol (SYMBICORT) 80-4.5 MCG/ACT inhaler Take 2 puffs first thing in am and then another 2 puffs about 12 hours later. 08/29/21   Tanda Rockers, MD  ELIQUIS 5 MG TABS tablet Take 5 mg by mouth 2 (two) times daily. 02/11/19   [provider]  escitalopram (LEXAPRO) 10 MG tablet Take 10 mg by mouth every evening.  01/24/19   [provider]  flecainide (TAMBOCOR) 150 MG tablet TAKE ONE TABLET BY MOUTH TWICE DAILY 08/09/21   Revankar, Reita Cliche, MD  metoprolol succinate (TOPROL-XL) 25 MG 24 hr tablet TAKE ONE TABLET BY MOUTH TWICE DAILY 07/10/21   Revankar, Reita Cliche, MD  temazepam (RESTORIL) 30 MG capsule Take 30 mg by mouth at bedtime. 10/29/20   [provider]      Allergies    Penicillins and Codeine    Review of Systems   Review of Systems  Musculoskeletal:  Positive for arthralgias.       Right knee pain  All other systems  reviewed and are negative.  Physical Exam Updated Vital Signs BP 124/61 (BP Location: Left Arm)   Pulse (!) 51   Temp 98.1 F (36.7 C) (Oral)   Resp 19   Ht 6\' 1"  (1.854 m)   Wt 131.5 kg   SpO2 91%   BMI 38.26 kg/m  Physical Exam Vitals and nursing note reviewed.  Constitutional:      Appearance: Normal appearance.  HENT:     Head: Normocephalic and atraumatic.  Eyes:     Conjunctiva/sclera: Conjunctivae normal.  Cardiovascular:     Pulses:          Posterior tibial pulses are 2+ on the right side and 2+ on the left side.  Pulmonary:     Effort: Pulmonary effort is normal. No respiratory distress.  Musculoskeletal:     Right knee: Effusion present. No deformity.     Instability Tests: Anterior drawer test positive.     Comments: Mild joint effusion. No deformity noted. No overlying skin changes or increased joint warmth. Normal sensation.   Skin:    General: Skin is warm and dry.  Neurological:     Mental Status: He is alert.  Psychiatric:        Mood and Affect: Mood normal.        Behavior:  Behavior normal.    ED Results / Procedures / Treatments   Labs (all labs ordered are listed, but only abnormal results are displayed) Labs Reviewed - No data to display  EKG None  Radiology No results found.  Procedures Procedures    Medications Ordered in ED Medications - No data to display  ED Course/ Medical Decision Making/ A&P                           Medical Decision Making  This patient is a 61 y.o. male who presents to the ED for concern of right knee pain.   Differential diagnoses prior to evaluation: Acute fracture/dislocation, ligamentous injury, knee sprain, DVT, septic joint  Past Medical History / Co-morbidities / Social History: Paroxysmal atrial fibrillation, COPD, CAD  Physical Exam: Physical exam performed. The pertinent findings include: Mild joint effusion. No deformity noted. No overlying skin changes or increased joint warmth.   Neuromuscularly and neurovascularly intact bilateral lower extremities.   Disposition: After consideration of the diagnostic results and the patients response to treatment, I feel that patient's not requiring admission or inpatient treatment for his symptoms.  I have greater suspicion for soft tissue injury in the knee, including ligamentous or meniscal injury.  Recommend the patient have an MRI of his knee.  He plans to call his PCP tomorrow morning to either schedule a scan or schedule an appointment with her.  She will refer to orthopedic surgery..  I have low suspicion for septic joint as patient is clinically well-appearing, afebrile, and there is no erythema or increased warmth of the joint.. Discussed reasons to return to the emergency department, and the patient is agreeable to the plan.   Final Clinical Impression(s) / ED Diagnoses Final diagnoses:  Acute pain of right knee    Rx / DC Orders ED Discharge Orders     None      Portions of this report may have been transcribed using voice recognition software. Every effort was made to ensure accuracy; however, inadvertent computerized transcription errors may be present.    Estill Cotta 02/06/22 1716    Gareth Morgan, MD 02/07/22 [phone]

## 2022-02-06 NOTE — ED Triage Notes (Signed)
Pt c/o right knee injury, was getting in the [place] and "something rip inside". Did not fall but unable to put pressure. Per pt, he's been having rt knee pain for weeks.

## 2022-02-06 NOTE — ED Notes (Signed)
Knee immobilizer in place. +circulation noted.

## 2022-05-20 ENCOUNTER — Other Ambulatory Visit: Payer: Self-pay | Admitting: Cardiology

## 2022-05-30 ENCOUNTER — Other Ambulatory Visit: Payer: Self-pay | Admitting: Cardiology

## 2022-05-31 DIAGNOSIS — I517 Cardiomegaly: Secondary | ICD-10-CM | POA: Diagnosis not present

## 2022-06-08 NOTE — Progress Notes (Signed)
Aaron [name]  [address] [place],  [place]  [address] [phone]  Clinic Day:  06/09/2022  Referring physician: Lowella Dandy, NP  HISTORY OF PRESENT ILLNESS:  The patient is a 61 y.o. male with stage IA3 (T1c N0 M0) lung adenocarcinoma, status post a right upper lobectomy in November 2020.  He comes in today for routine follow-up.  Of note, the patient was just in the hospital last week for acute dyspnea and tachycardia.  A CT angiogram done at that time did not reveal a pulmonary embolism or any pulmonary disease to explain his symptoms.  Eventually, the patient was discharged the following day.  Since that time, the patient claims to be doing well.  He denies having respiratory symptoms which concern him for disease recurrence.    PHYSICAL EXAM:  Blood pressure 129/78, pulse (!) 52, temperature 97.6 F (36.4 C), resp. rate 18, height 6' (1.829 m), weight (!) 302 lb 4.8 oz (137.1 kg), SpO2 92 %. Wt Readings from Last 3 Encounters:  06/09/22 (!) 302 lb 4.8 oz (137.1 kg)  02/06/22 290 lb (131.5 kg)  12/06/21 296 lb 4.8 oz (134.4 kg)   Body mass index is 41 kg/m. Performance status (ECOG): 0 Physical Exam Constitutional:      Appearance: Normal appearance. He is not ill-appearing.  HENT:     Mouth/Throat:     Mouth: Mucous membranes are moist.     Pharynx: Oropharynx is clear. No oropharyngeal exudate or posterior oropharyngeal erythema.  Cardiovascular:     Rate and Rhythm: Normal rate and regular rhythm.     Heart sounds: No murmur heard.    No friction rub. No gallop.  Pulmonary:     Effort: Pulmonary effort is normal. No respiratory distress.     Breath sounds: Normal breath sounds. No wheezing, rhonchi or rales.  Abdominal:     General: Bowel sounds are normal. There is no distension.     Palpations: Abdomen is soft. There is no mass.     Tenderness: There is no abdominal tenderness.  Musculoskeletal:        General: No swelling.      Right lower leg: No edema.     Left lower leg: No edema.  Lymphadenopathy:     Cervical: No cervical adenopathy.     Upper Body:     Right upper body: No supraclavicular or axillary adenopathy.     Left upper body: No supraclavicular or axillary adenopathy.     Lower Body: No right inguinal adenopathy. No left inguinal adenopathy.  Skin:    General: Skin is warm.     Coloration: Skin is not jaundiced.     Findings: No lesion or rash.  Neurological:     General: No focal deficit present.     Mental Status: He is alert and oriented to person, place, and time. Mental status is at baseline.  Psychiatric:        Mood and Affect: Mood normal.        Behavior: Behavior normal.        Thought Content: Thought content normal.    SCANS:  His chest CT on 05-30-22 revealed the following: FINDINGS: Cardiovascular: There is homogeneous enhancement in thoracic aorta. Coronary artery calcifications are seen. There are no intraluminal filling defects in pulmonary artery branches.  Mediastinum/Nodes: No significant lymphadenopathy is seen.  Lungs/Pleura: There is decreased volume in right lung consistent with previous partial removal. Few blebs are seen in the  left apex. Linear densities in right lung appear stable suggesting scarring. There is no focal pulmonary consolidation. There is no pleural effusion or pneumothorax.  Upper Abdomen: There is fatty infiltration liver. Surgical clips are seen in gallbladder fossa.  Musculoskeletal: Unremarkable.  Review of the MIP images confirms the above findings.  IMPRESSION: There is no evidence of pulmonary artery embolism. There is no evidence of thoracic aortic dissection. There is no focal pulmonary consolidation.  Coronary artery calcifications are seen. Fatty liver.  Other findings as described in the body of the report.  ASSESSMENT & PLAN:  Assessment/Plan:  A 61 y.o. male with stage IA3 (T1c N0 M0) lung adenocarcinoma, status post a  right upper lobectomy in November 2020.   In clinic today, I went over his chest CT images that were recently done in the hospital, for which he could see there are no findings which suggest disease recurrence.  From a lung cancer perspective, the patient remains disease-free.  My concern is that his shortness of breath may still be multifactorial.  From a lung cancer standpoint, I do believe the patient is doing very well.  I will see him back in 6 months for repeat clinical assessment, with a chest CT being done a day before his next visit to ascertain his new disease baseline.  The patient understands all the plans discussed today and is in agreement with them.   Aaron Renken Macarthur Critchley, MD

## 2022-06-09 ENCOUNTER — Inpatient Hospital Stay: Payer: 59 | Attending: Oncology | Admitting: Oncology

## 2022-06-09 ENCOUNTER — Inpatient Hospital Stay: Payer: 59

## 2022-06-09 VITALS — BP 129/78 | HR 52 | Temp 97.6°F | Resp 18 | Ht 72.0 in | Wt 302.3 lb

## 2022-06-09 DIAGNOSIS — C3411 Malignant neoplasm of upper lobe, right bronchus or lung: Secondary | ICD-10-CM

## 2022-06-09 LAB — BASIC METABOLIC PANEL
BUN: 14 (ref 4–21)
CO2: 27 — AB (ref 13–22)
Chloride: 103 (ref 99–108)
Creatinine: 0.8 (ref 0.6–1.3)
Glucose: 91
Potassium: 3.8 mEq/L (ref 3.5–5.1)
Sodium: 138 (ref 137–147)

## 2022-06-09 LAB — CBC: RBC: 4.81 (ref 3.87–5.11)

## 2022-06-09 LAB — HEPATIC FUNCTION PANEL
ALT: 28 U/L (ref 10–40)
AST: 31 (ref 14–40)
Alkaline Phosphatase: 87 (ref 25–125)
Bilirubin, Total: 1.1

## 2022-06-09 LAB — CBC AND DIFFERENTIAL
HCT: 44 (ref 41–53)
Hemoglobin: 14.7 (ref 13.5–17.5)
Neutrophils Absolute: 8.17
Platelets: 270 10*3/uL (ref 150–400)
WBC: 11.5

## 2022-06-09 LAB — COMPREHENSIVE METABOLIC PANEL
Albumin: 3.9 (ref 3.5–5.0)
Calcium: 8.6 — AB (ref 8.7–10.7)

## 2022-06-19 ENCOUNTER — Telehealth: Payer: Self-pay | Admitting: Cardiology

## 2022-06-19 NOTE — Telephone Encounter (Signed)
Pt would like to a provider switch  From: Revankar  To: Tobb

## 2022-07-14 ENCOUNTER — Other Ambulatory Visit: Payer: Self-pay | Admitting: Cardiology

## 2022-07-14 DIAGNOSIS — I480 Paroxysmal atrial fibrillation: Secondary | ICD-10-CM

## 2022-07-14 DIAGNOSIS — I2510 Atherosclerotic heart disease of native coronary artery without angina pectoris: Secondary | ICD-10-CM

## 2022-08-01 ENCOUNTER — Ambulatory Visit: Payer: 59 | Admitting: Cardiology

## 2022-08-04 ENCOUNTER — Ambulatory Visit: Payer: 59 | Attending: Cardiology | Admitting: Cardiology

## 2022-08-04 ENCOUNTER — Encounter: Payer: Self-pay | Admitting: Cardiology

## 2022-08-04 VITALS — BP 112/70 | HR 55 | Ht 72.0 in | Wt 305.0 lb

## 2022-08-04 DIAGNOSIS — R0683 Snoring: Secondary | ICD-10-CM

## 2022-08-04 DIAGNOSIS — Z79899 Other long term (current) drug therapy: Secondary | ICD-10-CM

## 2022-08-04 DIAGNOSIS — Z01812 Encounter for preprocedural laboratory examination: Secondary | ICD-10-CM | POA: Diagnosis not present

## 2022-08-04 DIAGNOSIS — R400 Somnolence: Secondary | ICD-10-CM | POA: Diagnosis not present

## 2022-08-04 DIAGNOSIS — R0609 Other forms of dyspnea: Secondary | ICD-10-CM

## 2022-08-04 DIAGNOSIS — I2510 Atherosclerotic heart disease of native coronary artery without angina pectoris: Secondary | ICD-10-CM

## 2022-08-04 DIAGNOSIS — I480 Paroxysmal atrial fibrillation: Secondary | ICD-10-CM

## 2022-08-04 NOTE — H&P (View-Only) (Signed)
Cardiology Office Note:    Date:  08/04/2022   ID:  Aaron [name], DOB [birth year], MRN [id number]  PCP:  Lowella Dandy, NP  Cardiologist:  Berniece Salines, DO  Electrophysiologist:  None   Referring MD: Lowella Dandy, NP      History of Present Illness:    Aaron [name] is a 61 y.o. male with a hx of coronary artery disease patient reports that he had a cath many many years ago, recently he has had a nuclear stress test back in 2023, paroxysmal atrial fibrillation on Eliquis and flecainide, morbid obesity, hyperlipidemia, history of lung cancer ectomy of the upper right lobe, COPD here today to be evaluated for worsening shortness of breath.  Patient tells me that over the last several months he has had worsening shortness of breath.  He has had to see his pulmonologist who is done multiple work-up and told him that his lung capacity based on his PFTs are all within normal.  And asked the patient to follow-up with cardiology.  He tells me on minimal exertion he gets significantly short of breath.  He is concerned about this.  He does not have much chest discomfort.  But he also notes that he has fatigue and daytime somnolence as well as snores at night.   No lightheadedness no dizziness. 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/27/2019   Anxiety    Arrhythmia    Complication of anesthesia    COPD  GOLD 0 = CT criteria only  01/26/2019   Quit smoking 2015 at wt = 170 lb  - 01/25/2019   Walked RA  2 laps @  approx 251ft each @ fast pace  stopped due to  [place] at wt = 276  - PFT's  07/01/2019  FEV1 3.16 (81 % ) ratio 0.78  p 7 % improvement from saba p nothing prior to study with DLCO wnl and f/v curve ok,  ERV 2% c/w effects of body habitus   Coronary artery calcification seen on CAT scan 05/30/2020   Dysrhythmia    afib   Insomnia 02/16/2019   Malignant 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 ([place]) 08/12/2019   Mixed dyslipidemia    Morbid obesity ([place]) 05/30/2020    Obesity (BMI 35.0-39.9 without comorbidity) 11/29/2020   PAF (paroxysmal atrial fibrillation) ([place]) 07/27/2019   Paroxysmal atrial fibrillation ([facility])    PONV (postoperative nausea and vomiting)    S/P lobectomy of lung 08/12/2019   Solitary pulmonary nodule on lung CT 01/26/2019    LDSCT 11/17/18 that did show 8.7 mm RUL nodule and centrilobular emphysema / lingular tree-in-bud  - repeat CT  05/19/19 increase RUL nodule to 15.6 mm  - PET 06/29/2019  The right upper lobe nodules highly hypermetabolic with maximum SUV of 11.2, most compatible with malignancy. No adenopathy or metastatic spread identified. 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 ([facility]    Past Surgical History:  Procedure Laterality Date   BACK SURGERY     CARDIOVERSION N/A 07/15/2019   Procedure: CARDIOVERSION;  Surgeon: Pixie Casino, MD;  Location: [facility];  Service: Cardiovascular;  Laterality: N/A;   CHOLECYSTECTOMY     TEE WITH CARDIOVERSION     TEE WITHOUT CARDIOVERSION N/A 07/15/2019   Procedure: TRANSESOPHAGEAL ECHOCARDIOGRAM (TEE);  Surgeon: Pixie Casino, MD;  Location: [facility];  Service: Cardiovascular;  Laterality: N/A;   VIDEO ASSISTED THORACOSCOPY (VATS)/ LOBECTOMY  08/12/2019  Procedure: RIGHT Video Assisted Thoracoscopy (Vats) With Right Upper Lobectomy;  Surgeon: Melrose Nakayama, MD;  Location[facility];  Service: Thoracic;;    Current Medications: Current Meds  Medication Sig   atorvastatin (LIPITOR) 10 MG tablet Take 10 mg by mouth every evening.    ELIQUIS 5 MG TABS tablet Take 5 mg by mouth 2 (two) times daily.   escitalopram (LEXAPRO) 10 MG tablet Take 10 mg by mouth every evening.    flecainide (TAMBOCOR) 150 MG tablet TAKE ONE TABLET BY MOUTH TWICE DAILY   metoprolol succinate (TOPROL-XL) 25 MG 24 hr tablet TAKE ONE TABLET BY MOUTH TWICE DAILY   temazepam (RESTORIL) 30 MG capsule Take 30 mg by mouth at bedtime.     Allergies:   Penicillins and Codeine    Social History   Socioeconomic History   Marital status: Married    Spouse name: Not on file   Number of children: Not on file   Years of education: Not on file   Highest education level: Not on file  Occupational History   Not on file  Tobacco Use   Smoking status: Former    Packs/day: 1.50    Years: 40.00    Total pack years: 60.00    Types: Cigarettes    Quit date: 09/22/2013    Years since quitting: 8.8   Smokeless tobacco: Never  Vaping Use   Vaping Use: Never used  Substance and Sexual Activity   Alcohol use: Not Currently   Drug use: Not Currently   Sexual activity: Not on file  Other Topics Concern   Not on file  Social History Narrative   Not on file   Social Determinants of Health   Financial Resource Strain: Not on file  Food Insecurity: Not on file  Transportation Needs: Not on file  Physical Activity: Not on file  Stress: Not on file  Social Connections: Not on file     Family History: The patient's family history includes Lung cancer in his brother, father, mother, and sister.  ROS:   Review of Systems  Constitution: Negative for decreased appetite, fever and weight gain.  HENT: Negative for congestion, ear discharge, hoarse voice and sore throat.   Eyes: Negative for discharge, redness, vision loss in right eye and visual halos.  Cardiovascular: Negative for chest pain, dyspnea on exertion, leg swelling, orthopnea and palpitations.  Respiratory: Negative for cough, hemoptysis, shortness of breath and snoring.   Endocrine: Negative for heat intolerance and polyphagia.  Hematologic/Lymphatic: Negative for bleeding problem. Does not bruise/bleed easily.  Skin: Negative for flushing, nail changes, rash and suspicious lesions.  Musculoskeletal: Negative for arthritis, joint pain, muscle cramps, myalgias, neck pain and stiffness.  Gastrointestinal: Negative for abdominal pain, bowel incontinence, diarrhea and excessive appetite.  Genitourinary:  Negative for decreased libido, genital sores and incomplete emptying.  Neurological: Negative for brief paralysis, focal weakness, headaches and loss of balance.  Psychiatric/Behavioral: Negative for altered mental status, depression and suicidal ideas.  Allergic/Immunologic: Negative for HIV exposure and persistent infections.    EKGs/Labs/Other Studies Reviewed:    The following studies were reviewed today:   EKG:  The ekg ordered today demonstrates sinus bradycardia.  Nuclear stress test which was done December 05, 2020 The left ventricular ejection fraction is normal (55-65%). Nuclear stress EF: 65%. There was no ST segment deviation noted during stress. This is a low risk study. No evidence of ischemia or MI.  Transthoracic echocardiogram 03/07/2019 IMPRESSIONS     1. The  left ventricle has normal systolic function, with an ejection  fraction of 55-60%. The cavity size was normal. Left ventricular diastolic  Doppler parameters are indeterminate.   2. The right ventricle has normal systolic function. The cavity was  normal. There is no increase in right ventricular wall thickness.   3. Left atrial size was moderately dilated.   4. Right atrial size was mildly dilated.   5. The aortic valve has an indeterminate number of cusps. Aortic valve  regurgitation was not assessed by color flow Doppler.   FINDINGS   Left Ventricle: The left ventricle has normal systolic function, with an  ejection fraction of 55-60%. The cavity size was normal. There is no  increase in left ventricular wall thickness. Left ventricular diastolic  Doppler parameters are indeterminate.   Right Ventricle: The right ventricle has normal systolic function. The  cavity was normal. There is no increase in right ventricular wall  thickness.   Left Atrium: Left atrial size was moderately dilated.   Right Atrium: Right atrial size was mildly dilated. Right atrial pressure  is estimated at 3 mmHg.    Interatrial Septum: No atrial level shunt detected by color flow Doppler.   Pericardium: There is no evidence of pericardial effusion.   Mitral Valve: The mitral valve is normal in structure. Mitral valve  regurgitation is mild by color flow Doppler.   Tricuspid Valve: The tricuspid valve is normal in structure. Tricuspid  valve regurgitation was not visualized by color flow Doppler.   Aortic Valve: The aortic valve has an indeterminate number of cusps Aortic  valve regurgitation was not assessed by color flow Doppler.   Pulmonic Valve: The pulmonic valve was normal in structure. Pulmonic valve  regurgitation was not assessed by color flow Doppler.   Venous: The inferior vena cava measures 1.70 cm, is normal in size with  greater than 50% respiratory variability.       Recent Labs: 06/09/2022: ALT 28; BUN 14; Creatinine 0.8; Hemoglobin 14.7; Platelets 270; Potassium 3.8; Sodium 138  Recent Lipid Panel    Component Value Date/Time   CHOL 139 11/29/2020 0837   TRIG 95 11/29/2020 0837   HDL 34 (L) 11/29/2020 0837   CHOLHDL 4.1 11/29/2020 0837   LDLCALC 87 11/29/2020 0837    Physical Exam:    VS:  BP 112/70   Pulse (!) 55   Ht 6' (1.829 m)   Wt (!) 305 lb (138.3 kg)   SpO2 98%   BMI 41.37 kg/m     Wt Readings from Last 3 Encounters:  08/04/22 (!) 305 lb (138.3 kg)  06/09/22 (!) 302 lb 4.8 oz (137.1 kg)  02/06/22 290 lb (131.5 kg)     GEN: Well nourished, well developed in no acute distress HEENT: Normal NECK: No JVD; No carotid bruits LYMPHATICS: No lymphadenopathy CARDIAC: S1S2 noted,RRR, no murmurs, rubs, gallops RESPIRATORY:  Clear to auscultation without rales, wheezing or rhonchi  ABDOMEN: Soft, non-tender, non-distended, +bowel sounds, no guarding. EXTREMITIES: No edema, No cyanosis, no clubbing MUSCULOSKELETAL:  No deformity  SKIN: Warm and dry NEUROLOGIC:  Alert and oriented x 3, non-focal PSYCHIATRIC:  Normal affect, good insight  ASSESSMENT:     1. Medication management   2. Pre-procedure lab exam   3. Snoring   4. Daytime somnolence   5. DOE (dyspnea on exertion)   6. PAF (paroxysmal atrial fibrillation) ([place])   7. Coronary artery calcification seen on CAT scan   8. Morbid obesity ([place])    PLAN:  His dyspnea on exertion I suspect is his anginal equivalent and also like to understand pressures in his right heart as I do suspect highly the patient does have sleep apnea to also rule out pulmonary hypertension.  I read left heart catheterization will be done at this time.  The patient understands that risks include but are not limited to stroke (1 in 1000), death (1 in 88), kidney failure [usually temporary] (1 in 500), bleeding (1 in 200), allergic reaction [possibly serious] (1 in 200), and agrees to proceed. He will take aspirin 81 mg on the day of this procedure.  In terms of his paroxysmal atrial fibrillation he is in sinus bradycardia today we will continue the patient on the Eliquis as well as the flecainide.  We will get flecainide level.  After we get his diagnostic ischemic evaluation understand his right heart pressure I will like to place a monitor on the patient  I do highly suspect that he has sleep apnea in the setting of daytime somnolence we will get a sleep study set up for the patient.  The patient understands the need to lose weight with diet and exercise. We have discussed specific strategies for this.  The patient is in agreement with the above plan. The patient left the office in stable condition.  The patient will follow up in 4 weeks or sooner if needed.   Medication Adjustments/Labs and Tests Ordered: Current medicines are reviewed at length with the patient today.  Concerns regarding medicines are outlined above.  Orders Placed This Encounter  Procedures   CBC   Basic Metabolic Panel (BMET)   Flecainide level   EKG 12-Lead   Split night study   No orders of the defined types were placed in  this encounter.   Patient Instructions  Medication Instructions:  Your physician recommends that you continue on your current medications as directed. Please refer to the Current Medication list given to you today.  *If you need a refill on your cardiac medications before your next appointment, please call your pharmacy*   Lab Work: Your physician recommends that you have the following labs drawn today: CBC, BMP, and Flecainide level   If you have labs (blood work) drawn today and your tests are completely normal, you will receive your results only by: [place] (if you have MyChart) OR A paper copy in the mail If you have any lab test that is abnormal or we need to change your treatment, we will call you to review the results.   Testing/Procedures: Your physician has requested that you have a cardiac catheterization. Cardiac catheterization is used to diagnose and/or treat various heart conditions. Doctors may recommend this procedure for a number of different reasons. The most common reason is to evaluate chest pain. Chest pain can be a symptom of coronary artery disease (CAD), and cardiac catheterization can show whether plaque is narrowing or blocking your heart's arteries. This procedure is also used to evaluate the valves, as well as measure the blood flow and oxygen levels in different parts of your heart. For further information please visit [email or web address]. Please follow instruction sheet, as given.   Your physician has recommended that you have a sleep study. This test records several body functions during sleep, including: brain activity, eye movement, oxygen and carbon dioxide blood levels, heart rate and rhythm, breathing rate and rhythm, the flow of air through your mouth and nose, snoring, body muscle movements, and chest and belly movement.    Follow-Up:  At [facility], you and your health needs are our priority.  As part of our continuing mission to  provide you with exceptional heart care, we have created designated Provider Care Teams.  These Care Teams include your primary Cardiologist (physician) and Advanced Practice Providers (APPs -  Physician Assistants and Nurse Practitioners) who all work together to provide you with the care you need, when you need it.  We recommend signing up for the patient portal called "MyChart".  Sign up information is provided on this After Visit Summary.  MyChart is used to connect with patients for Virtual Visits (Telemedicine).  Patients are able to view lab/test results, encounter notes, upcoming appointments, etc.  Non-urgent messages can be sent to your provider as well.   To learn more about what you can do with MyChart, go to [email or web address].    Your next appointment:   4 week(s)  The format for your next appointment:   In Person  Provider:   Berniece Salines, DO     Other Instructions       Cardiac/Peripheral Catheterization   You are scheduled for a Cardiac Catheterization on Friday, November 17 with Dr. Peter Martinique.  1. Please arrive at the Main Entrance A at [facility]: [place], [place] [address] on November 17 at 5:30 AM (This time is two hours before your procedure to ensure your preparation). Free valet parking service is available. You will check in at ADMITTING. The support person will be asked to wait in the waiting room.  It is OK to have someone drop you off and come back when you are ready to be discharged.        Special note: Every effort is made to have your procedure done on time. Please understand that emergencies sometimes delay scheduled procedures.   . 2. Diet: Do not eat solid foods after midnight.  You may have clear liquids until 5 AM the day of the procedure.  3. Labs: You will need to have blood drawn in office today   4. Medication instructions in preparation for your procedure:   Contrast Allergy: No   Stop taking Eliquis  (Apixiban) on Wednesday, November 15.   On the morning of your procedure, take Aspirin 81 mg and any morning medicines NOT listed above.  You may use sips of water.  5. Plan to go home the same day, you will only stay overnight if medically necessary. 6. You MUST have a responsible adult to drive you home. 7. An adult MUST be with you the first 24 hours after you arrive home. 8. Bring a current list of your medications, and the last time and date medication taken. 9. Bring ID and current insurance cards. 10.Please wear clothes that are easy to get on and off and wear slip-on shoes.  Thank you for allowing [place] to care for you!   --  Invasive Cardiovascular services     Adopting a Healthy Lifestyle.  Know what a healthy weight is for you (roughly BMI <25) and aim to maintain this   Aim for 7+ servings of fruits and vegetables daily   65-80+ fluid ounces of water or unsweet tea for healthy kidneys   Limit to max 1 drink of alcohol per day; avoid smoking/tobacco   Limit animal fats in diet for cholesterol and heart health - choose grass fed whenever available   Avoid highly processed foods, and foods high in saturated/trans fats   Aim for low  stress - take time to unwind and care for your mental health   Aim for 150 min of moderate intensity exercise weekly for heart health, and weights twice weekly for bone health   Aim for 7-9 hours of sleep daily   When it comes to diets, agreement about the perfect plan isnt easy to find, even among the experts. Experts at the [employer] developed an idea known as the Healthy Eating Plate. Just imagine a plate divided into logical, healthy portions.   The emphasis is on diet quality:   Load up on vegetables and fruits - one-half of your plate: Aim for color and variety, and remember that potatoes dont count.   Go for whole grains - one-quarter of your plate: Whole wheat, barley, wheat berries, quinoa, oats,  brown rice, and foods made with them. If you want pasta, go with whole wheat pasta.   Protein power - one-quarter of your plate: Fish, chicken, beans, and nuts are all healthy, versatile protein sources. Limit red meat.   The diet, however, does go beyond the plate, offering a few other suggestions.   Use healthy plant oils, such as olive, canola, soy, corn, sunflower and peanut. Check the labels, and avoid partially hydrogenated oil, which have unhealthy trans fats.   If youre thirsty, drink water. Coffee and tea are good in moderation, but skip sugary drinks and limit milk and dairy products to one or two daily servings.   The type of carbohydrate in the diet is more important than the amount. Some sources of carbohydrates, such as vegetables, fruits, whole grains, and beans-are healthier than others.   Finally, stay active  Signed, Berniece Salines, DO  08/04/2022 8:39 AM    [facility]

## 2022-08-04 NOTE — Progress Notes (Signed)
Cardiology Office Note:    Date:  08/04/2022   ID:  Aaron [name], DOB [birth year], MRN [id number]  PCP:  Lowella Dandy, NP  Cardiologist:  Berniece Salines, DO  Electrophysiologist:  None   Referring MD: Lowella Dandy, NP      History of Present Illness:    Aaron [name] is a 61 y.o. male with a hx of coronary artery disease patient reports that he had a cath many many years ago, recently he has had a nuclear stress test back in 2023, paroxysmal atrial fibrillation on Eliquis and flecainide, morbid obesity, hyperlipidemia, history of lung cancer ectomy of the upper right lobe, COPD here today to be evaluated for worsening shortness of breath.  Patient tells me that over the last several months he has had worsening shortness of breath.  He has had to see his pulmonologist who is done multiple work-up and told him that his lung capacity based on his PFTs are all within normal.  And asked the patient to follow-up with cardiology.  He tells me on minimal exertion he gets significantly short of breath.  He is concerned about this.  He does not have much chest discomfort.  But he also notes that he has fatigue and daytime somnolence as well as snores at night.   No lightheadedness no dizziness. 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/27/2019   Anxiety    Arrhythmia    Complication of anesthesia    COPD  GOLD 0 = CT criteria only  01/26/2019   Quit smoking 2015 at wt = 170 lb  - 01/25/2019   Walked RA  2 laps @  approx 230ft each @ fast pace  stopped due to  [place] at wt = 276  - PFT's  07/01/2019  FEV1 3.16 (81 % ) ratio 0.78  p 7 % improvement from saba p nothing prior to study with DLCO wnl and f/v curve ok,  ERV 2% c/w effects of body habitus   Coronary artery calcification seen on CAT scan 05/30/2020   Dysrhythmia    afib   Insomnia 02/16/2019   Malignant 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 ([place]) 08/12/2019   Mixed dyslipidemia    Morbid obesity ([place]) 05/30/2020    Obesity (BMI 35.0-39.9 without comorbidity) 11/29/2020   PAF (paroxysmal atrial fibrillation) ([place]) 07/27/2019   Paroxysmal atrial fibrillation ([facility])    PONV (postoperative nausea and vomiting)    S/P lobectomy of lung 08/12/2019   Solitary pulmonary nodule on lung CT 01/26/2019    LDSCT 11/17/18 that did show 8.7 mm RUL nodule and centrilobular emphysema / lingular tree-in-bud  - repeat CT  05/19/19 increase RUL nodule to 15.6 mm  - PET 06/29/2019  The right upper lobe nodules highly hypermetabolic with maximum SUV of 11.2, most compatible with malignancy. No adenopathy or metastatic spread identified. 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 ([place]    Past Surgical History:  Procedure Laterality Date   BACK SURGERY     CARDIOVERSION N/A 07/15/2019   Procedure: CARDIOVERSION;  Surgeon: Pixie Casino, MD;  Location: [facility];  Service: Cardiovascular;  Laterality: N/A;   CHOLECYSTECTOMY     TEE WITH CARDIOVERSION     TEE WITHOUT CARDIOVERSION N/A 07/15/2019   Procedure: TRANSESOPHAGEAL ECHOCARDIOGRAM (TEE);  Surgeon: Pixie Casino, MD;  Location: [facility];  Service: Cardiovascular;  Laterality: N/A;   VIDEO ASSISTED THORACOSCOPY (VATS)/ LOBECTOMY  08/12/2019  Procedure: RIGHT Video Assisted Thoracoscopy (Vats) With Right Upper Lobectomy;  Surgeon: Melrose Nakayama, MD;  Location: [facility] OR;  Service: Thoracic;;    Current Medications: Current Meds  Medication Sig   atorvastatin (LIPITOR) 10 MG tablet Take 10 mg by mouth every evening.    ELIQUIS 5 MG TABS tablet Take 5 mg by mouth 2 (two) times daily.   escitalopram (LEXAPRO) 10 MG tablet Take 10 mg by mouth every evening.    flecainide (TAMBOCOR) 150 MG tablet TAKE ONE TABLET BY MOUTH TWICE DAILY   metoprolol succinate (TOPROL-XL) 25 MG 24 hr tablet TAKE ONE TABLET BY MOUTH TWICE DAILY   temazepam (RESTORIL) 30 MG capsule Take 30 mg by mouth at bedtime.     Allergies:   Penicillins and Codeine    Social History   Socioeconomic History   Marital status: Married    Spouse name: Not on file   Number of children: Not on file   Years of education: Not on file   Highest education level: Not on file  Occupational History   Not on file  Tobacco Use   Smoking status: Former    Packs/day: 1.50    Years: 40.00    Total pack years: 60.00    Types: Cigarettes    Quit date: 09/22/2013    Years since quitting: 8.8   Smokeless tobacco: Never  Vaping Use   Vaping Use: Never used  Substance and Sexual Activity   Alcohol use: Not Currently   Drug use: Not Currently   Sexual activity: Not on file  Other Topics Concern   Not on file  Social History Narrative   Not on file   Social Determinants of Health   Financial Resource Strain: Not on file  Food Insecurity: Not on file  Transportation Needs: Not on file  Physical Activity: Not on file  Stress: Not on file  Social Connections: Not on file     Family History: The patient's family history includes Lung cancer in his brother, father, mother, and sister.  ROS:   Review of Systems  Constitution: Negative for decreased appetite, fever and weight gain.  HENT: Negative for congestion, ear discharge, hoarse voice and sore throat.   Eyes: Negative for discharge, redness, vision loss in right eye and visual halos.  Cardiovascular: Negative for chest pain, dyspnea on exertion, leg swelling, orthopnea and palpitations.  Respiratory: Negative for cough, hemoptysis, shortness of breath and snoring.   Endocrine: Negative for heat intolerance and polyphagia.  Hematologic/Lymphatic: Negative for bleeding problem. Does not bruise/bleed easily.  Skin: Negative for flushing, nail changes, rash and suspicious lesions.  Musculoskeletal: Negative for arthritis, joint pain, muscle cramps, myalgias, neck pain and stiffness.  Gastrointestinal: Negative for abdominal pain, bowel incontinence, diarrhea and excessive appetite.  Genitourinary:  Negative for decreased libido, genital sores and incomplete emptying.  Neurological: Negative for brief paralysis, focal weakness, headaches and loss of balance.  Psychiatric/Behavioral: Negative for altered mental status, depression and suicidal ideas.  Allergic/Immunologic: Negative for HIV exposure and persistent infections.    EKGs/Labs/Other Studies Reviewed:    The following studies were reviewed today:   EKG:  The ekg ordered today demonstrates sinus bradycardia.  Nuclear stress test which was done December 05, 2020 The left ventricular ejection fraction is normal (55-65%). Nuclear stress EF: 65%. There was no ST segment deviation noted during stress. This is a low risk study. No evidence of ischemia or MI.  Transthoracic echocardiogram 03/07/2019 IMPRESSIONS     1. The  left ventricle has normal systolic function, with an ejection  fraction of 55-60%. The cavity size was normal. Left ventricular diastolic  Doppler parameters are indeterminate.   2. The right ventricle has normal systolic function. The cavity was  normal. There is no increase in right ventricular wall thickness.   3. Left atrial size was moderately dilated.   4. Right atrial size was mildly dilated.   5. The aortic valve has an indeterminate number of cusps. Aortic valve  regurgitation was not assessed by color flow Doppler.   FINDINGS   Left Ventricle: The left ventricle has normal systolic function, with an  ejection fraction of 55-60%. The cavity size was normal. There is no  increase in left ventricular wall thickness. Left ventricular diastolic  Doppler parameters are indeterminate.   Right Ventricle: The right ventricle has normal systolic function. The  cavity was normal. There is no increase in right ventricular wall  thickness.   Left Atrium: Left atrial size was moderately dilated.   Right Atrium: Right atrial size was mildly dilated. Right atrial pressure  is estimated at 3 mmHg.    Interatrial Septum: No atrial level shunt detected by color flow Doppler.   Pericardium: There is no evidence of pericardial effusion.   Mitral Valve: The mitral valve is normal in structure. Mitral valve  regurgitation is mild by color flow Doppler.   Tricuspid Valve: The tricuspid valve is normal in structure. Tricuspid  valve regurgitation was not visualized by color flow Doppler.   Aortic Valve: The aortic valve has an indeterminate number of cusps Aortic  valve regurgitation was not assessed by color flow Doppler.   Pulmonic Valve: The pulmonic valve was normal in structure. Pulmonic valve  regurgitation was not assessed by color flow Doppler.   Venous: The inferior vena cava measures 1.70 cm, is normal in size with  greater than 50% respiratory variability.       Recent Labs: 06/09/2022: ALT 28; BUN 14; Creatinine 0.8; Hemoglobin 14.7; Platelets 270; Potassium 3.8; Sodium 138  Recent Lipid Panel    Component Value Date/Time   CHOL 139 11/29/2020 0837   TRIG 95 11/29/2020 0837   HDL 34 (L) 11/29/2020 0837   CHOLHDL 4.1 11/29/2020 0837   LDLCALC 87 11/29/2020 0837    Physical Exam:    VS:  BP 112/70   Pulse (!) 55   Ht 6' (1.829 m)   Wt (!) 305 lb (138.3 kg)   SpO2 98%   BMI 41.37 kg/m     Wt Readings from Last 3 Encounters:  08/04/22 (!) 305 lb (138.3 kg)  06/09/22 (!) 302 lb 4.8 oz (137.1 kg)  02/06/22 290 lb (131.5 kg)     GEN: Well nourished, well developed in no acute distress HEENT: Normal NECK: No JVD; No carotid bruits LYMPHATICS: No lymphadenopathy CARDIAC: S1S2 noted,RRR, no murmurs, rubs, gallops RESPIRATORY:  Clear to auscultation without rales, wheezing or rhonchi  ABDOMEN: Soft, non-tender, non-distended, +bowel sounds, no guarding. EXTREMITIES: No edema, No cyanosis, no clubbing MUSCULOSKELETAL:  No deformity  SKIN: Warm and dry NEUROLOGIC:  Alert and oriented x 3, non-focal PSYCHIATRIC:  Normal affect, good insight  ASSESSMENT:     1. Medication management   2. Pre-procedure lab exam   3. Snoring   4. Daytime somnolence   5. DOE (dyspnea on exertion)   6. PAF (paroxysmal atrial fibrillation) ([place])   7. Coronary artery calcification seen on CAT scan   8. Morbid obesity ([place])    PLAN:  His dyspnea on exertion I suspect is his anginal equivalent and also like to understand pressures in his right heart as I do suspect highly the patient does have sleep apnea to also rule out pulmonary hypertension.  I read left heart catheterization will be done at this time.  The patient understands that risks include but are not limited to stroke (1 in 1000), death (1 in 60), kidney failure [usually temporary] (1 in 500), bleeding (1 in 200), allergic reaction [possibly serious] (1 in 200), and agrees to proceed. He will take aspirin 81 mg on the day of this procedure.  In terms of his paroxysmal atrial fibrillation he is in sinus bradycardia today we will continue the patient on the Eliquis as well as the flecainide.  We will get flecainide level.  After we get his diagnostic ischemic evaluation understand his right heart pressure I will like to place a monitor on the patient  I do highly suspect that he has sleep apnea in the setting of daytime somnolence we will get a sleep study set up for the patient.  The patient understands the need to lose weight with diet and exercise. We have discussed specific strategies for this.  The patient is in agreement with the above plan. The patient left the office in stable condition.  The patient will follow up in 4 weeks or sooner if needed.   Medication Adjustments/Labs and Tests Ordered: Current medicines are reviewed at length with the patient today.  Concerns regarding medicines are outlined above.  Orders Placed This Encounter  Procedures   CBC   Basic Metabolic Panel (BMET)   Flecainide level   EKG 12-Lead   Split night study   No orders of the defined types were placed in  this encounter.   Patient Instructions  Medication Instructions:  Your physician recommends that you continue on your current medications as directed. Please refer to the Current Medication list given to you today.  *If you need a refill on your cardiac medications before your next appointment, please call your pharmacy*   Lab Work: Your physician recommends that you have the following labs drawn today: CBC, BMP, and Flecainide level   If you have labs (blood work) drawn today and your tests are completely normal, you will receive your results only by: [place] (if you have MyChart) OR A paper copy in the mail If you have any lab test that is abnormal or we need to change your treatment, we will call you to review the results.   Testing/Procedures: Your physician has requested that you have a cardiac catheterization. Cardiac catheterization is used to diagnose and/or treat various heart conditions. Doctors may recommend this procedure for a number of different reasons. The most common reason is to evaluate chest pain. Chest pain can be a symptom of coronary artery disease (CAD), and cardiac catheterization can show whether plaque is narrowing or blocking your heart's arteries. This procedure is also used to evaluate the valves, as well as measure the blood flow and oxygen levels in different parts of your heart. For further information please visit HugeFiesta.tn. Please follow instruction sheet, as given.   Your physician has recommended that you have a sleep study. This test records several body functions during sleep, including: brain activity, eye movement, oxygen and carbon dioxide blood levels, heart rate and rhythm, breathing rate and rhythm, the flow of air through your mouth and nose, snoring, body muscle movements, and chest and belly movement.    Follow-Up:  At [facility], you and your health needs are our priority.  As part of our continuing mission to  provide you with exceptional heart care, we have created designated Provider Care Teams.  These Care Teams include your primary Cardiologist (physician) and Advanced Practice Providers (APPs -  Physician Assistants and Nurse Practitioners) who all work together to provide you with the care you need, when you need it.  We recommend signing up for the patient portal called "MyChart".  Sign up information is provided on this After Visit Summary.  MyChart is used to connect with patients for Virtual Visits (Telemedicine).  Patients are able to view lab/test results, encounter notes, upcoming appointments, etc.  Non-urgent messages can be sent to your provider as well.   To learn more about what you can do with MyChart, go to [email or web address].    Your next appointment:   4 week(s)  The format for your next appointment:   In Person  Provider:   Berniece Salines, DO     Other Instructions       Cardiac/Peripheral Catheterization   You are scheduled for a Cardiac Catheterization on Friday, November 17 with Dr. Peter Martinique.  1. Please arrive at the Main Entrance A at [facility] [place], [place] [address] on November 17 at 5:30 AM (This time is two hours before your procedure to ensure your preparation). Free valet parking service is available. You will check in at ADMITTING. The support person will be asked to wait in the waiting room.  It is OK to have someone drop you off and come back when you are ready to be discharged.        Special note: Every effort is made to have your procedure done on time. Please understand that emergencies sometimes delay scheduled procedures.   . 2. Diet: Do not eat solid foods after midnight.  You may have clear liquids until 5 AM the day of the procedure.  3. Labs: You will need to have blood drawn in office today   4. Medication instructions in preparation for your procedure:   Contrast Allergy: No   Stop taking Eliquis  (Apixiban) on Wednesday, November 15.   On the morning of your procedure, take Aspirin 81 mg and any morning medicines NOT listed above.  You may use sips of water.  5. Plan to go home the same day, you will only stay overnight if medically necessary. 6. You MUST have a responsible adult to drive you home. 7. An adult MUST be with you the first 24 hours after you arrive home. 8. Bring a current list of your medications, and the last time and date medication taken. 9. Bring ID and current insurance cards. 10.Please wear clothes that are easy to get on and off and wear slip-on shoes.  Thank you for allowing [place] to care for you!   -- [facility]     Adopting a Healthy Lifestyle.  Know what a healthy weight is for you (roughly BMI <25) and aim to maintain this   Aim for 7+ servings of fruits and vegetables daily   65-80+ fluid ounces of water or unsweet tea for healthy kidneys   Limit to max 1 drink of alcohol per day; avoid smoking/tobacco   Limit animal fats in diet for cholesterol and heart health - choose grass fed whenever available   Avoid highly processed foods, and foods high in saturated/trans fats   Aim for low  stress - take time to unwind and care for your mental health   Aim for 150 min of moderate intensity exercise weekly for heart health, and weights twice weekly for bone health   Aim for 7-9 hours of sleep daily   When it comes to diets, agreement about the perfect plan isnt easy to find, even among the experts. Experts at the [employer] developed an idea known as the Healthy Eating Plate. Just imagine a plate divided into logical, healthy portions.   The emphasis is on diet quality:   Load up on vegetables and fruits - one-half of your plate: Aim for color and variety, and remember that potatoes dont count.   Go for whole grains - one-quarter of your plate: Whole wheat, barley, wheat berries, quinoa, oats,  brown rice, and foods made with them. If you want pasta, go with whole wheat pasta.   Protein power - one-quarter of your plate: Fish, chicken, beans, and nuts are all healthy, versatile protein sources. Limit red meat.   The diet, however, does go beyond the plate, offering a few other suggestions.   Use healthy plant oils, such as olive, canola, soy, corn, sunflower and peanut. Check the labels, and avoid partially hydrogenated oil, which have unhealthy trans fats.   If youre thirsty, drink water. Coffee and tea are good in moderation, but skip sugary drinks and limit milk and dairy products to one or two daily servings.   The type of carbohydrate in the diet is more important than the amount. Some sources of carbohydrates, such as vegetables, fruits, whole grains, and beans-are healthier than others.   Finally, stay active  Signed, Berniece Salines, DO  08/04/2022 8:39 AM    [facility]

## 2022-08-04 NOTE — Patient Instructions (Addendum)
Medication Instructions:  Your physician recommends that you continue on your current medications as directed. Please refer to the Current Medication list given to you today.  *If you need a refill on your cardiac medications before your next appointment, please call your pharmacy*   Lab Work: Your physician recommends that you have the following labs drawn today: CBC, BMP, and Flecainide level   If you have labs (blood work) drawn today and your tests are completely normal, you will receive your results only by: [place] (if you have MyChart) OR A paper copy in the mail If you have any lab test that is abnormal or we need to change your treatment, we will call you to review the results.   Testing/Procedures: Your physician has requested that you have a cardiac catheterization. Cardiac catheterization is used to diagnose and/or treat various heart conditions. Doctors may recommend this procedure for a number of different reasons. The most common reason is to evaluate chest pain. Chest pain can be a symptom of coronary artery disease (CAD), and cardiac catheterization can show whether plaque is narrowing or blocking your heart's arteries. This procedure is also used to evaluate the valves, as well as measure the blood flow and oxygen levels in different parts of your heart. For further information please visit HugeFiesta.tn. Please follow instruction sheet, as given.   Your physician has recommended that you have a sleep study. This test records several body functions during sleep, including: brain activity, eye movement, oxygen and carbon dioxide blood levels, heart rate and rhythm, breathing rate and rhythm, the flow of air through your mouth and nose, snoring, body muscle movements, and chest and belly movement.    Follow-Up: At [facility], you and your health needs are our priority.  As part of our continuing mission to provide you with exceptional heart care, we have  created designated Provider Care Teams.  These Care Teams include your primary Cardiologist (physician) and Advanced Practice Providers (APPs -  Physician Assistants and Nurse Practitioners) who all work together to provide you with the care you need, when you need it.  We recommend signing up for the patient portal called "MyChart".  Sign up information is provided on this After Visit Summary.  MyChart is used to connect with patients for Virtual Visits (Telemedicine).  Patients are able to view lab/test results, encounter notes, upcoming appointments, etc.  Non-urgent messages can be sent to your provider as well.   To learn more about what you can do with MyChart, go to [email or web address].    Your next appointment:   4 week(s)  The format for your next appointment:   In Person  Provider:   Berniece Salines, DO     Other Instructions       Cardiac/Peripheral Catheterization   You are scheduled for a Cardiac Catheterization on Friday, November 17 with Dr. Peter Martinique.  1. Please arrive at the Main Entrance A at [facility]: [place], [place] [address] on November 17 at 5:30 AM (This time is two hours before your procedure to ensure your preparation). Free valet parking service is available. You will check in at ADMITTING. The support person will be asked to wait in the waiting room.  It is OK to have someone drop you off and come back when you are ready to be discharged.        Special note: Every effort is made to have your procedure done on time. Please understand that emergencies  sometimes delay scheduled procedures.   . 2. Diet: Do not eat solid foods after midnight.  You may have clear liquids until 5 AM the day of the procedure.  3. Labs: You will need to have blood drawn in office today   4. Medication instructions in preparation for your procedure:   Contrast Allergy: No   Stop taking Eliquis (Apixiban) on Wednesday, November 15.   On the  morning of your procedure, take Aspirin 81 mg and any morning medicines NOT listed above.  You may use sips of water.  5. Plan to go home the same day, you will only stay overnight if medically necessary. 6. You MUST have a responsible adult to drive you home. 7. An adult MUST be with you the first 24 hours after you arrive home. 8. Bring a current list of your medications, and the last time and date medication taken. 9. Bring ID and current insurance cards. 10.Please wear clothes that are easy to get on and off and wear slip-on shoes.  Thank you for allowing [place] to care for you!   -- [place] [facility]

## 2022-08-06 ENCOUNTER — Telehealth: Payer: Self-pay | Admitting: *Deleted

## 2022-08-06 NOTE — Telephone Encounter (Signed)
Cardiac Catheterization scheduled at [facility] for: Friday August 08, 2022 7:30 AM Arrival time and place: [facility] Entrance A at: 5:30 AM  Nothing to eat after midnight prior to procedure, clear liquids until 5 AM day of procedure.  Medication instructions: -Hold:  Eliquis-none 08/06/22 until post procedure -Except hold medications usual morning medications can be taken with sips of water including aspirin 81 mg.  Confirmed patient has responsible adult to drive home post procedure and be with patient first 24 hours after arriving home.  Patient reports no new symptoms concerning for COVID-19 in the past 10 days.  Reviewed procedure instructions with patient.

## 2022-08-07 ENCOUNTER — Telehealth: Payer: Self-pay | Admitting: *Deleted

## 2022-08-07 ENCOUNTER — Other Ambulatory Visit: Payer: Self-pay | Admitting: Cardiology

## 2022-08-07 DIAGNOSIS — G4700 Insomnia, unspecified: Secondary | ICD-10-CM

## 2022-08-07 DIAGNOSIS — E669 Obesity, unspecified: Secondary | ICD-10-CM

## 2022-08-07 DIAGNOSIS — I480 Paroxysmal atrial fibrillation: Secondary | ICD-10-CM

## 2022-08-07 NOTE — Telephone Encounter (Signed)
Received a call from [facility]) informing of their decision to  deny requested split night sleep study. Ordering provider can opt to change to a HST, call and do a peer to peer or appeal their decision. Dr Harriet Masson will be notified for further instructions.

## 2022-08-08 ENCOUNTER — Other Ambulatory Visit: Payer: Self-pay

## 2022-08-08 ENCOUNTER — Encounter (HOSPITAL_COMMUNITY)
Admission: RE | Disposition: A | Discharge status: Home / Self Care | Payer: Self-pay | Source: Home / Self Care | Attending: Cardiology

## 2022-08-08 ENCOUNTER — Ambulatory Visit (HOSPITAL_COMMUNITY)
Admission: RE | Admit: 2022-08-08 | Discharge: 2022-08-08 | Disposition: A | Discharge status: Home / Self Care | Payer: 59 | Attending: Cardiology | Admitting: Cardiology

## 2022-08-08 ENCOUNTER — Encounter (HOSPITAL_COMMUNITY): Payer: Self-pay | Admitting: Cardiovascular Disease

## 2022-08-08 DIAGNOSIS — R0609 Other forms of dyspnea: Secondary | ICD-10-CM | POA: Diagnosis present

## 2022-08-08 DIAGNOSIS — I2510 Atherosclerotic heart disease of native coronary artery without angina pectoris: Secondary | ICD-10-CM

## 2022-08-08 DIAGNOSIS — Z7901 Long term (current) use of anticoagulants: Secondary | ICD-10-CM | POA: Insufficient documentation

## 2022-08-08 DIAGNOSIS — I480 Paroxysmal atrial fibrillation: Secondary | ICD-10-CM | POA: Diagnosis not present

## 2022-08-08 DIAGNOSIS — I272 Pulmonary hypertension, unspecified: Secondary | ICD-10-CM | POA: Insufficient documentation

## 2022-08-08 DIAGNOSIS — Z87891 Personal history of nicotine dependence: Secondary | ICD-10-CM | POA: Diagnosis not present

## 2022-08-08 DIAGNOSIS — R400 Somnolence: Secondary | ICD-10-CM | POA: Insufficient documentation

## 2022-08-08 DIAGNOSIS — R0683 Snoring: Secondary | ICD-10-CM | POA: Diagnosis not present

## 2022-08-08 DIAGNOSIS — Z85118 Personal history of other malignant neoplasm of bronchus and lung: Secondary | ICD-10-CM | POA: Diagnosis not present

## 2022-08-08 DIAGNOSIS — Z6841 Body Mass Index (BMI) 40.0 and over, adult: Secondary | ICD-10-CM | POA: Diagnosis not present

## 2022-08-08 DIAGNOSIS — I2584 Coronary atherosclerosis due to calcified coronary lesion: Secondary | ICD-10-CM | POA: Insufficient documentation

## 2022-08-08 DIAGNOSIS — E785 Hyperlipidemia, unspecified: Secondary | ICD-10-CM | POA: Insufficient documentation

## 2022-08-08 DIAGNOSIS — J449 Chronic obstructive pulmonary disease, unspecified: Secondary | ICD-10-CM | POA: Diagnosis not present

## 2022-08-08 HISTORY — PX: RIGHT/LEFT HEART CATH AND CORONARY ANGIOGRAPHY: CATH118266

## 2022-08-08 SURGERY — RIGHT/LEFT HEART CATH AND CORONARY ANGIOGRAPHY
Anesthesia: LOCAL

## 2022-08-08 MED ORDER — ASPIRIN 81 MG PO CHEW: 81.0000 mg | CHEWABLE_TABLET | ORAL | Status: AC

## 2022-08-08 MED ORDER — SODIUM CHLORIDE 0.9 % WEIGHT BASED INFUSION: 1.0000 mL/kg/h | INTRAVENOUS | Status: DC

## 2022-08-08 MED ORDER — MIDAZOLAM HCL 2 MG/2ML IJ SOLN
INTRAMUSCULAR | Status: AC
  Filled 2022-08-08: qty 2

## 2022-08-08 MED ORDER — HEPARIN SODIUM (PORCINE) 1000 UNIT/ML IJ SOLN
INTRAMUSCULAR | Status: AC
  Filled 2022-08-08: qty 10

## 2022-08-08 MED ORDER — MIDAZOLAM HCL 2 MG/2ML IJ SOLN
INTRAMUSCULAR | Status: DC | PRN
  Administered 2022-08-08: 2 mg via INTRAVENOUS
  Administered 2022-08-08: 1 mg via INTRAVENOUS

## 2022-08-08 MED ORDER — HYDRALAZINE HCL 20 MG/ML IJ SOLN: 10.0000 mg | INTRAMUSCULAR | Status: DC | PRN

## 2022-08-08 MED ORDER — SODIUM CHLORIDE 0.9% FLUSH: 3.0000 mL | Freq: Two times a day (BID) | INTRAVENOUS | Status: DC

## 2022-08-08 MED ORDER — ONDANSETRON HCL 4 MG/2ML IJ SOLN: 4.0000 mg | Freq: Four times a day (QID) | INTRAMUSCULAR | Status: DC | PRN

## 2022-08-08 MED ORDER — LIDOCAINE HCL (PF) 1 % IJ SOLN
INTRAMUSCULAR | Status: DC | PRN
  Administered 2022-08-08: 5 mL

## 2022-08-08 MED ORDER — SODIUM CHLORIDE 0.9 % IV SOLN: INTRAVENOUS | Status: DC

## 2022-08-08 MED ORDER — SODIUM CHLORIDE 0.9 % IV SOLN: 250.0000 mL | INTRAVENOUS | Status: DC | PRN

## 2022-08-08 MED ORDER — SODIUM CHLORIDE 0.9 % WEIGHT BASED INFUSION
3.0000 mL/kg/h | INTRAVENOUS | Status: AC
  Administered 2022-08-08: 3 mL/kg/h via INTRAVENOUS

## 2022-08-08 MED ORDER — FENTANYL CITRATE (PF) 100 MCG/2ML IJ SOLN
INTRAMUSCULAR | Status: AC
  Filled 2022-08-08: qty 2

## 2022-08-08 MED ORDER — LABETALOL HCL 5 MG/ML IV SOLN: 10.0000 mg | INTRAVENOUS | Status: DC | PRN

## 2022-08-08 MED ORDER — HEPARIN SODIUM (PORCINE) 1000 UNIT/ML IJ SOLN
INTRAMUSCULAR | Status: DC | PRN
  Administered 2022-08-08: 6500 [IU] via INTRAVENOUS

## 2022-08-08 MED ORDER — HEPARIN (PORCINE) IN NACL 1000-0.9 UT/500ML-% IV SOLN
INTRAVENOUS | Status: AC
  Filled 2022-08-08: qty 500

## 2022-08-08 MED ORDER — IOHEXOL 350 MG/ML SOLN
INTRAVENOUS | Status: DC | PRN
  Administered 2022-08-08: 50 mL

## 2022-08-08 MED ORDER — SODIUM CHLORIDE 0.9% FLUSH: 3.0000 mL | INTRAVENOUS | Status: DC | PRN

## 2022-08-08 MED ORDER — ASPIRIN 81 MG PO CHEW: 81.0000 mg | CHEWABLE_TABLET | Freq: Every day | ORAL | Status: DC

## 2022-08-08 MED ORDER — FENTANYL CITRATE (PF) 100 MCG/2ML IJ SOLN
INTRAMUSCULAR | Status: DC | PRN
  Administered 2022-08-08: 50 ug via INTRAVENOUS
  Administered 2022-08-08: 25 ug via INTRAVENOUS

## 2022-08-08 MED ORDER — VERAPAMIL HCL 2.5 MG/ML IV SOLN
INTRAVENOUS | Status: AC
  Filled 2022-08-08: qty 2

## 2022-08-08 MED ORDER — HEPARIN (PORCINE) IN NACL 1000-0.9 UT/500ML-% IV SOLN
INTRAVENOUS | Status: DC | PRN
  Administered 2022-08-08 (×2): 500 mL

## 2022-08-08 MED ORDER — LIDOCAINE HCL (PF) 1 % IJ SOLN
INTRAMUSCULAR | Status: AC
  Filled 2022-08-08: qty 30

## 2022-08-08 MED ORDER — ACETAMINOPHEN 325 MG PO TABS: 650.0000 mg | ORAL_TABLET | ORAL | Status: DC | PRN

## 2022-08-08 MED ORDER — VERAPAMIL HCL 2.5 MG/ML IV SOLN
INTRAVENOUS | Status: DC | PRN
  Administered 2022-08-08: 10 mL via INTRA_ARTERIAL

## 2022-08-08 SURGICAL SUPPLY — 14 items
BAND ZEPHYR COMPRESS 30 LONG (HEMOSTASIS) IMPLANT
CATH OPTITORQUE TIG 4.0 5F (CATHETERS) IMPLANT
CATH SWAN GANZ 7F STRAIGHT (CATHETERS) IMPLANT
DEVICE RAD COMP TR BAND LRG (VASCULAR PRODUCTS) IMPLANT
GLIDESHEATH SLEND SS 6F .021 (SHEATH) IMPLANT
GLIDESHEATH SLENDER 7FR .021G (SHEATH) IMPLANT
GUIDEWIRE INQWIRE 1.5J.035X260 (WIRE) IMPLANT
INQWIRE 1.5J .035X260CM (WIRE) ×1
KIT HEART LEFT (KITS) ×1 IMPLANT
PACK CARDIAC CATHETERIZATION (CUSTOM PROCEDURE TRAY) ×1 IMPLANT
SHEATH PROBE COVER 6X72 (BAG) IMPLANT
TRANSDUCER W/STOPCOCK (MISCELLANEOUS) ×1 IMPLANT
TUBING CIL FLEX 10 FLL-RA (TUBING) ×1 IMPLANT
WIRE MICROINTRODUCER 60CM (WIRE) IMPLANT

## 2022-08-08 NOTE — Interval H&P Note (Signed)
Cath Lab Visit (complete for each Cath Lab visit)  Clinical Evaluation Leading to the Procedure:   ACS: No.  Non-ACS:    Anginal Classification: CCS II  Anti-ischemic medical therapy: Minimal Therapy (1 class of medications)  Non-Invasive Test Results: No non-invasive testing performed  Prior CABG: No previous CABG      History and Physical Interval Note:  08/08/2022 7:45 AM  Aaron [name]  has presented today for surgery, with the diagnosis of chest pain.  The various methods of treatment have been discussed with the patient and family. After consideration of risks, benefits and other options for treatment, the patient has consented to  Procedure(s): RIGHT/LEFT HEART CATH AND CORONARY ANGIOGRAPHY (N/A) as a surgical intervention.  The patient's history has been reviewed, patient examined, no change in status, stable for surgery.  I have reviewed the patient's chart and labs.  Questions were answered to the patient's satisfaction.     Shelva Majestic

## 2022-08-11 LAB — POCT I-STAT 7, (LYTES, BLD GAS, ICA,H+H)
Acid-Base Excess: 1 mmol/L (ref 0.0–2.0)
Bicarbonate: 26.9 mmol/L (ref 20.0–28.0)
Calcium, Ion: 1.17 mmol/L (ref 1.15–1.40)
HCT: 41 % (ref 39.0–52.0)
Hemoglobin: 13.9 g/dL (ref 13.0–17.0)
O2 Saturation: 99 %
Potassium: 4.1 mmol/L (ref 3.5–5.1)
Sodium: 140 mmol/L (ref 135–145)
TCO2: 28 mmol/L (ref 22–32)
pCO2 arterial: 48.2 mmHg — ABNORMAL HIGH (ref 32–48)
pH, Arterial: 7.355 (ref 7.35–7.45)
pO2, Arterial: 137 mmHg — ABNORMAL HIGH (ref 83–108)

## 2022-08-11 LAB — POCT I-STAT EG7
Acid-Base Excess: 1 mmol/L (ref 0.0–2.0)
Acid-Base Excess: 3 mmol/L — ABNORMAL HIGH (ref 0.0–2.0)
Bicarbonate: 27.5 mmol/L (ref 20.0–28.0)
Bicarbonate: 29.9 mmol/L — ABNORMAL HIGH (ref 20.0–28.0)
Calcium, Ion: 1.11 mmol/L — ABNORMAL LOW (ref 1.15–1.40)
Calcium, Ion: 1.17 mmol/L (ref 1.15–1.40)
HCT: 40 % (ref 39.0–52.0)
HCT: 42 % (ref 39.0–52.0)
Hemoglobin: 13.6 g/dL (ref 13.0–17.0)
Hemoglobin: 14.3 g/dL (ref 13.0–17.0)
O2 Saturation: 68 %
O2 Saturation: 75 %
Potassium: 4 mmol/L (ref 3.5–5.1)
Potassium: 4.1 mmol/L (ref 3.5–5.1)
Sodium: 141 mmol/L (ref 135–145)
Sodium: 141 mmol/L (ref 135–145)
TCO2: 29 mmol/L (ref 22–32)
TCO2: 31 mmol/L (ref 22–32)
pCO2, Ven: 52 mmHg (ref 44–60)
pCO2, Ven: 52.5 mmHg (ref 44–60)
pH, Ven: 7.331 (ref 7.25–7.43)
pH, Ven: 7.363 (ref 7.25–7.43)
pO2, Ven: 37 mmHg (ref 32–45)
pO2, Ven: 43 mmHg (ref 32–45)

## 2022-08-13 LAB — CBC
Hematocrit: 43.9 % (ref 37.5–51.0)
Hemoglobin: 14.7 g/dL (ref 13.0–17.7)
MCH: 30.5 pg (ref 26.6–33.0)
MCHC: 33.5 g/dL (ref 31.5–35.7)
MCV: 91 fL (ref 79–97)
Platelets: 268 10*3/uL (ref 150–450)
RBC: 4.82 x10E6/uL (ref 4.14–5.80)
RDW: 11.9 % (ref 11.6–15.4)
WBC: 9 10*3/uL (ref 3.4–10.8)

## 2022-08-13 LAB — BASIC METABOLIC PANEL
BUN/Creatinine Ratio: 17 (ref 10–24)
BUN: 14 mg/dL (ref 8–27)
CO2: 22 mmol/L (ref 20–29)
Calcium: 8.7 mg/dL (ref 8.6–10.2)
Chloride: 102 mmol/L (ref 96–106)
Creatinine, Ser: 0.84 mg/dL (ref 0.76–1.27)
Glucose: 102 mg/dL — ABNORMAL HIGH (ref 70–99)
Potassium: 4 mmol/L (ref 3.5–5.2)
Sodium: 139 mmol/L (ref 134–144)
eGFR: 98 mL/min/{1.73_m2} (ref >59)

## 2022-08-13 LAB — FLECAINIDE LEVEL: Flecainide: 0.61 ug/ml (ref 0.20–1.00)

## 2022-08-22 ENCOUNTER — Ambulatory Visit (HOSPITAL_BASED_OUTPATIENT_CLINIC_OR_DEPARTMENT_OTHER): Payer: 59 | Admitting: Cardiovascular Disease

## 2022-08-22 DIAGNOSIS — G4700 Insomnia, unspecified: Secondary | ICD-10-CM

## 2022-08-22 DIAGNOSIS — E669 Obesity, unspecified: Secondary | ICD-10-CM

## 2022-08-22 DIAGNOSIS — I480 Paroxysmal atrial fibrillation: Secondary | ICD-10-CM

## 2022-08-29 ENCOUNTER — Encounter: Payer: Self-pay | Admitting: Cardiology

## 2022-08-29 ENCOUNTER — Ambulatory Visit: Payer: 59 | Attending: Cardiology | Admitting: Cardiology

## 2022-08-29 VITALS — BP 110/68 | HR 59 | Ht 72.0 in | Wt 302.4 lb

## 2022-08-29 DIAGNOSIS — I480 Paroxysmal atrial fibrillation: Secondary | ICD-10-CM

## 2022-08-29 DIAGNOSIS — I272 Pulmonary hypertension, unspecified: Secondary | ICD-10-CM | POA: Diagnosis not present

## 2022-08-29 DIAGNOSIS — I2510 Atherosclerotic heart disease of native coronary artery without angina pectoris: Secondary | ICD-10-CM

## 2022-08-29 DIAGNOSIS — E782 Mixed hyperlipidemia: Secondary | ICD-10-CM

## 2022-08-29 MED ORDER — FLECAINIDE ACETATE 150 MG PO TABS: 150.0000 mg | ORAL_TABLET | Freq: Two times a day (BID) | ORAL | 3 refills | Status: DC

## 2022-08-29 MED ORDER — ATORVASTATIN CALCIUM 10 MG PO TABS: 10.0000 mg | ORAL_TABLET | Freq: Every morning | ORAL | 3 refills | Status: DC

## 2022-08-29 MED ORDER — METOPROLOL SUCCINATE ER 25 MG PO TB24: 25.0000 mg | ORAL_TABLET | Freq: Two times a day (BID) | ORAL | 3 refills | Status: DC

## 2022-08-29 MED ORDER — ELIQUIS 5 MG PO TABS: 5.0000 mg | ORAL_TABLET | Freq: Two times a day (BID) | ORAL | 3 refills | Status: DC

## 2022-08-29 NOTE — Progress Notes (Signed)
Cardiology Office Note:    Date:  08/29/2022   ID:  [name], DOB Feb 18, 1961, MRN [id number]  PCP:  Lowella Dandy, NP  Cardiologist:  Berniece Salines, DO  Electrophysiologist:  None   Referring MD: Lowella Dandy, NP   " I am doing ok - still short of breath though"  History of Present Illness:    Masyn Rostro is a 61 y.o. male with a hx of mild nonobstructive coronary artery disease, paroxysmal atrial fibrillation on Eliquis and flecainide along with Toprol XL, morbid obesity, pulmonary hypertension recently noted on his right heart catheterization, hyperlipidemia, history of lung cancer and lobectomy of his upper right lobe, COPD.  I saw the patient on August 04, 2022 at that time he was experiencing significant shortness of breath and admitted to snoring as well.  His shortness of breath was out of proportion and therefore sent the patient for  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/27/2019   Anxiety    Arrhythmia    Complication of anesthesia    COPD  GOLD 0 = CT criteria only  01/26/2019   Quit smoking 2015 at wt = 170 lb  - 01/25/2019   Walked RA  2 laps @  approx 214ft each @ fast pace  stopped due to  [place] at wt = 276  - PFT's  07/01/2019  FEV1 3.16 (81 % ) ratio 0.78  p 7 % improvement from saba p nothing prior to study with DLCO wnl and f/v curve ok,  ERV 2% c/w effects of body habitus   Coronary artery calcification seen on CAT scan 05/30/2020   Dysrhythmia    afib   Insomnia 02/16/2019   Malignant 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 ([place]) 08/12/2019   Mixed dyslipidemia    Morbid obesity ([place]) 05/30/2020   Obesity (BMI 35.0-39.9 without comorbidity) 11/29/2020   PAF (paroxysmal atrial fibrillation) ([place]) 07/27/2019   Paroxysmal atrial fibrillation ([facility])    PONV (postoperative nausea and vomiting)    S/P lobectomy of lung 08/12/2019   Solitary pulmonary nodule on lung CT 01/26/2019    LDSCT 11/17/18 that did show 8.7 mm RUL nodule and centrilobular  emphysema / lingular tree-in-bud  - repeat CT  05/19/19 increase RUL nodule to 15.6 mm  - PET 06/29/2019  The right upper lobe nodules highly hypermetabolic with maximum SUV of 11.2, most compatible with malignancy. No adenopathy or metastatic spread identified. 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 ([place]    Past Surgical History:  Procedure Laterality Date   BACK SURGERY     CARDIOVERSION N/A 07/15/2019   Procedure: CARDIOVERSION;  Surgeon: Pixie Casino, MD;  Location: [facility] ENDOSCOPY;  Service: Cardiovascular;  Laterality: N/A;   CHOLECYSTECTOMY     RIGHT/LEFT HEART CATH AND CORONARY ANGIOGRAPHY N/A 08/08/2022   Procedure: RIGHT/LEFT HEART CATH AND CORONARY ANGIOGRAPHY;  Surgeon: Troy Sine, MD;  Location: [place] CV LAB;  Service: Cardiovascular;  Laterality: N/A;   TEE WITH CARDIOVERSION     TEE WITHOUT CARDIOVERSION N/A 07/15/2019   Procedure: TRANSESOPHAGEAL ECHOCARDIOGRAM (TEE);  Surgeon: Pixie Casino, MD;  Location: [place];  Service: Cardiovascular;  Laterality: N/A;   VIDEO ASSISTED THORACOSCOPY (VATS)/ LOBECTOMY  08/12/2019   Procedure: RIGHT Video Assisted Thoracoscopy (Vats) With Right Upper Lobectomy;  Surgeon: Melrose Nakayama, MD;  Location: [facility] OR;  Service: Thoracic;;    Current Medications: Current Meds  Medication Sig  escitalopram (LEXAPRO) 10 MG tablet Take 10 mg by mouth in the morning.   temazepam (RESTORIL) 30 MG capsule Take 30 mg by mouth at bedtime.   [DISCONTINUED] atorvastatin (LIPITOR) 10 MG tablet Take 10 mg by mouth in the morning.   [DISCONTINUED] ELIQUIS 5 MG TABS tablet Take 5 mg by mouth 2 (two) times daily.   [DISCONTINUED] flecainide (TAMBOCOR) 150 MG tablet TAKE ONE TABLET BY MOUTH TWICE DAILY   [DISCONTINUED] metoprolol succinate (TOPROL-XL) 25 MG 24 hr tablet TAKE ONE TABLET BY MOUTH TWICE DAILY     Allergies:   Penicillins and Codeine   Social History   Socioeconomic History   Marital  status: Married    Spouse name: Not on file   Number of children: Not on file   Years of education: Not on file   Highest education level: Not on file  Occupational History   Not on file  Tobacco Use   Smoking status: Former    Packs/day: 1.50    Years: 40.00    Total pack years: 60.00    Types: Cigarettes    Quit date: 09/22/2013    Years since quitting: 8.9   Smokeless tobacco: Never  Vaping Use   Vaping Use: Never used  Substance and Sexual Activity   Alcohol use: Not Currently   Drug use: Not Currently   Sexual activity: Not on file  Other Topics Concern   Not on file  Social History Narrative   Not on file   Social Determinants of Health   Financial Resource Strain: Not on file  Food Insecurity: Not on file  Transportation Needs: Not on file  Physical Activity: Not on file  Stress: Not on file  Social Connections: Not on file     Family History: The patient's family history includes Lung cancer in his brother, father, mother, and sister.  ROS:   Review of Systems  Constitution: Negative for decreased appetite, fever and weight gain.  HENT: Negative for congestion, ear discharge, hoarse voice and sore throat.   Eyes: Negative for discharge, redness, vision loss in right eye and visual halos.  Cardiovascular: Negative for chest pain, dyspnea on exertion, leg swelling, orthopnea and palpitations.  Respiratory: Negative for cough, hemoptysis, shortness of breath and snoring.   Endocrine: Negative for heat intolerance and polyphagia.  Hematologic/Lymphatic: Negative for bleeding problem. Does not bruise/bleed easily.  Skin: Negative for flushing, nail changes, rash and suspicious lesions.  Musculoskeletal: Negative for arthritis, joint pain, muscle cramps, myalgias, neck pain and stiffness.  Gastrointestinal: Negative for abdominal pain, bowel incontinence, diarrhea and excessive appetite.  Genitourinary: Negative for decreased libido, genital sores and incomplete  emptying.  Neurological: Negative for brief paralysis, focal weakness, headaches and loss of balance.  Psychiatric/Behavioral: Negative for altered mental status, depression and suicidal ideas.  Allergic/Immunologic: Negative for HIV exposure and persistent infections.    EKGs/Labs/Other Studies Reviewed:    The following studies were reviewed today:   EKG: None today    Heart catheterization August 08, 2022   Mid LM to Dist LM lesion is 20% stenosed.   Mid LAD-1 lesion is 50% stenosed.   Mid LAD-2 lesion is 50% stenosed.   2nd Diag lesion is 50% stenosed.   Prox RCA lesion is 30% stenosed.   Mild to moderate coronary calcification.   Mild to moderate CAD with smooth 20% mid left main stenosis; 50% focal proximal LAD stenosis at the origin of the first diagonal and septal perforating artery, 50% mid and mid  distal LAD stenoses; normal left circumflex coronary artery, and large dominant RCA with smooth 30% proximal to mid stenosis.   Mild right heart pressure elevation with mild pulmonary hypertension with mean PA pressure at 31 mm.   RECOMMENDATION: Patient has history of PAF.  Can resume Eliquis tomorrow.  Consider concomitant low-dose aspirin therapy with his coronary calcification and moderate obstructive disease.  Medical therapy for CAD; consider initiation of additional anti-ischemic regimen.  Suspect sleep apnea may be contributing to his pulmonary hypertension.  He is scheduled to undergo a sleep evaluation.  Aggressive lipid-lowering therapy with target LDL less than 70.  Transthoracic echocardiogram 03/07/2019 IMPRESSIONS  1. The left ventricle has normal systolic function, with an ejection  fraction of 55-60%. The cavity size was normal. Left ventricular diastolic  Doppler parameters are indeterminate.   2. The right ventricle has normal systolic function. The cavity was  normal. There is no increase in right ventricular wall thickness.   3. Left atrial size was  moderately dilated.   4. Right atrial size was mildly dilated.   5. The aortic valve has an indeterminate number of cusps. Aortic valve  regurgitation was not assessed by color flow Doppler.   FINDINGS   Left Ventricle: The left ventricle has normal systolic function, with an  ejection fraction of 55-60%. The cavity size was normal. There is no  increase in left ventricular wall thickness. Left ventricular diastolic  Doppler parameters are indeterminate.   Right Ventricle: The right ventricle has normal systolic function. The  cavity was normal. There is no increase in right ventricular wall  thickness.   Left Atrium: Left atrial size was moderately dilated.   Right Atrium: Right atrial size was mildly dilated. Right atrial pressure  is estimated at 3 mmHg.   Interatrial Septum: No atrial level shunt detected by color flow Doppler.   Pericardium: There is no evidence of pericardial effusion.   Mitral Valve: The mitral valve is normal in structure. Mitral valve  regurgitation is mild by color flow Doppler.   Tricuspid Valve: The tricuspid valve is normal in structure. Tricuspid  valve regurgitation was not visualized by color flow Doppler.   Aortic Valve: The aortic valve has an indeterminate number of cusps Aortic  valve regurgitation was not assessed by color flow Doppler.   Pulmonic Valve: The pulmonic valve was normal in structure. Pulmonic valve  regurgitation was not assessed by color flow Doppler.   Venous: The inferior vena cava measures 1.70 cm, is normal in size with  greater than 50% respiratory variability.   Recent Labs: 06/09/2022: ALT 28 08/04/2022: BUN 14; Creatinine, Ser 0.84; Platelets 268 08/08/2022: Hemoglobin 13.9; Potassium 4.1; Sodium 140  Recent Lipid Panel    Component Value Date/Time   CHOL 139 11/29/2020 0837   TRIG 95 11/29/2020 0837   HDL 34 (L) 11/29/2020 0837   CHOLHDL 4.1 11/29/2020 0837   LDLCALC 87 11/29/2020 0837    Physical Exam:     VS:  BP 110/68   Pulse (!) 59   Ht 6' (1.829 m)   Wt (!) 302 lb 6.4 oz (137.2 kg)   SpO2 94%   BMI 41.01 kg/m     Wt Readings from Last 3 Encounters:  08/29/22 (!) 302 lb 6.4 oz (137.2 kg)  08/08/22 300 lb (136.1 kg)  08/04/22 (!) 305 lb (138.3 kg)     GEN: Well nourished, well developed in no acute distress HEENT: Normal NECK: No JVD; No carotid bruits LYMPHATICS: No lymphadenopathy CARDIAC:  S1S2 noted,RRR, no murmurs, rubs, gallops RESPIRATORY:  Clear to auscultation without rales, wheezing or rhonchi  ABDOMEN: Soft, non-tender, non-distended, +bowel sounds, no guarding. EXTREMITIES: No edema, No cyanosis, no clubbing MUSCULOSKELETAL:  No deformity  SKIN: Warm and dry NEUROLOGIC:  Alert and oriented x 3, non-focal PSYCHIATRIC:  Normal affect, good insight  ASSESSMENT:    1. Coronary artery disease involving native coronary artery of native heart without angina pectoris   2. PAF (paroxysmal atrial fibrillation) ([place])   3. Mild pulmonary hypertension ([place])   4. Morbid obesity ([place])   5. Mixed hyperlipidemia    PLAN:    He is status post right and left heart catheterization.  No indication for any PCI at this time.  Mild pulmonary hypertension noted which I do believe is due to untreated sleep apnea.  He had his initial sleep study but unfortunately this could not be read he is going to pick up the me again for an upcoming sleep study.  We discussed his new LDL goal is less than 70 for now we will keep him on his current dose of atorvastatin repeat his lipid profile should we need I will increase his atorvastatin to 20 mg daily.  Continue flecainide, Toprol-XL as well as Eliquis for his atrial fibrillation.  The patient understands the need to lose weight with diet and exercise. We have discussed specific strategies for this.  The patient is in agreement with the above plan. The patient left the office in stable condition.  The patient will follow up in 6 months or  sooner if needed   Medication Adjustments/Labs and Tests Ordered: Current medicines are reviewed at length with the patient today.  Concerns regarding medicines are outlined above.  No orders of the defined types were placed in this encounter.  Meds ordered this encounter  Medications   atorvastatin (LIPITOR) 10 MG tablet    Sig: Take 1 tablet (10 mg total) by mouth in the morning.    Dispense:  90 tablet    Refill:  3   ELIQUIS 5 MG TABS tablet    Sig: Take 1 tablet (5 mg total) by mouth 2 (two) times daily.    Dispense:  180 tablet    Refill:  3   flecainide (TAMBOCOR) 150 MG tablet    Sig: Take 1 tablet (150 mg total) by mouth 2 (two) times daily.    Dispense:  180 tablet    Refill:  3   metoprolol succinate (TOPROL-XL) 25 MG 24 hr tablet    Sig: Take 1 tablet (25 mg total) by mouth 2 (two) times daily.    Dispense:  180 tablet    Refill:  3    Please arrange appt in January for further refills    Patient Instructions  Medication Instructions:  Your physician recommends that you continue on your current medications as directed. Please refer to the Current Medication list given to you today.  *If you need a refill on your cardiac medications before your next appointment, please call your pharmacy*   Lab Work: NONE If you have labs (blood work) drawn today and your tests are completely normal, you will receive your results only by: [place] (if you have MyChart) OR A paper copy in the mail If you have any lab test that is abnormal or we need to change your treatment, we will call you to review the results.   Testing/Procedures: NONE   Follow-Up: At [facility], you and your health needs  are our priority.  As part of our continuing mission to provide you with exceptional heart care, we have created designated Provider Care Teams.  These Care Teams include your primary Cardiologist (physician) and Advanced Practice Providers (APPs -  Physician Assistants  and Nurse Practitioners) who all work together to provide you with the care you need, when you need it.  We recommend signing up for the patient portal called "MyChart".  Sign up information is provided on this After Visit Summary.  MyChart is used to connect with patients for Virtual Visits (Telemedicine).  Patients are able to view lab/test results, encounter notes, upcoming appointments, etc.  Non-urgent messages can be sent to your provider as well.   To learn more about what you can do with MyChart, go to [email or web address].    Your next appointment:   6 month(s)  The format for your next appointment:   In Person  Provider:   Berniece Salines, DO     Adopting a Healthy Lifestyle.  Know what a healthy weight is for you (roughly BMI <25) and aim to maintain this   Aim for 7+ servings of fruits and vegetables daily   65-80+ fluid ounces of water or unsweet tea for healthy kidneys   Limit to max 1 drink of alcohol per day; avoid smoking/tobacco   Limit animal fats in diet for cholesterol and heart health - choose grass fed whenever available   Avoid highly processed foods, and foods high in saturated/trans fats   Aim for low stress - take time to unwind and care for your mental health   Aim for 150 min of moderate intensity exercise weekly for heart health, and weights twice weekly for bone health   Aim for 7-9 hours of sleep daily   When it comes to diets, agreement about the perfect plan isnt easy to find, even among the experts. Experts at the [employer] developed an idea known as the Healthy Eating Plate. Just imagine a plate divided into logical, healthy portions.   The emphasis is on diet quality:   Load up on vegetables and fruits - one-half of your plate: Aim for color and variety, and remember that potatoes dont count.   Go for whole grains - one-quarter of your plate: Whole wheat, barley, wheat berries, quinoa, oats, brown rice, and foods  made with them. If you want pasta, go with whole wheat pasta.   Protein power - one-quarter of your plate: Fish, chicken, beans, and nuts are all healthy, versatile protein sources. Limit red meat.   The diet, however, does go beyond the plate, offering a few other suggestions.   Use healthy plant oils, such as olive, canola, soy, corn, sunflower and peanut. Check the labels, and avoid partially hydrogenated oil, which have unhealthy trans fats.   If youre thirsty, drink water. Coffee and tea are good in moderation, but skip sugary drinks and limit milk and dairy products to one or two daily servings.   The type of carbohydrate in the diet is more important than the amount. Some sources of carbohydrates, such as vegetables, fruits, whole grains, and beans-are healthier than others.   Finally, stay active  Signed, Berniece Salines, DO  08/29/2022 8:24 AM    [place]

## 2022-08-29 NOTE — Patient Instructions (Signed)
Medication Instructions:  Your physician recommends that you continue on your current medications as directed. Please refer to the Current Medication list given to you today.  *If you need a refill on your cardiac medications before your next appointment, please call your pharmacy*   Lab Work: NONE If you have labs (blood work) drawn today and your tests are completely normal, you will receive your results only by: [place] (if you have MyChart) OR A paper copy in the mail If you have any lab test that is abnormal or we need to change your treatment, we will call you to review the results.   Testing/Procedures: NONE   Follow-Up: At [facility], you and your health needs are our priority.  As part of our continuing mission to provide you with exceptional heart care, we have created designated Provider Care Teams.  These Care Teams include your primary Cardiologist (physician) and Advanced Practice Providers (APPs -  Physician Assistants and Nurse Practitioners) who all work together to provide you with the care you need, when you need it.  We recommend signing up for the patient portal called "MyChart".  Sign up information is provided on this After Visit Summary.  MyChart is used to connect with patients for Virtual Visits (Telemedicine).  Patients are able to view lab/test results, encounter notes, upcoming appointments, etc.  Non-urgent messages can be sent to your provider as well.   To learn more about what you can do with MyChart, go to [email or web address].    Your next appointment:   6 month(s)  The format for your next appointment:   In Person  Provider:   Berniece Salines, DO

## 2022-09-03 ENCOUNTER — Ambulatory Visit (HOSPITAL_BASED_OUTPATIENT_CLINIC_OR_DEPARTMENT_OTHER): Payer: 59 | Attending: Cardiology | Admitting: Cardiovascular Disease

## 2022-09-03 VITALS — Ht 72.0 in | Wt 305.0 lb

## 2022-09-03 DIAGNOSIS — I480 Paroxysmal atrial fibrillation: Secondary | ICD-10-CM | POA: Diagnosis not present

## 2022-09-03 DIAGNOSIS — G4700 Insomnia, unspecified: Secondary | ICD-10-CM | POA: Insufficient documentation

## 2022-09-03 DIAGNOSIS — Z6841 Body Mass Index (BMI) 40.0 and over, adult: Secondary | ICD-10-CM | POA: Insufficient documentation

## 2022-09-03 DIAGNOSIS — E669 Obesity, unspecified: Secondary | ICD-10-CM | POA: Diagnosis not present

## 2022-09-03 DIAGNOSIS — G4733 Obstructive sleep apnea (adult) (pediatric): Secondary | ICD-10-CM | POA: Insufficient documentation

## 2022-09-16 ENCOUNTER — Encounter (HOSPITAL_BASED_OUTPATIENT_CLINIC_OR_DEPARTMENT_OTHER): Payer: Self-pay | Admitting: Cardiovascular Disease

## 2022-09-16 NOTE — Procedures (Signed)
      Patient Name: [name], Aaron Date: 09/03/2022 Gender: Male D.O.B: [birth year] Age (years): 36 Referring Provider: Godfrey Pick Tobb DO Height (inches): 72 Interpreting Physician: Shelva Majestic MD, ABSM Weight (lbs): 305 RPSGT: Jacolyn Reedy BMI: 41 MRN: [id number] Neck Size: 18.50  CLINICAL INFORMATION Sleep Study Type: HST  Indication for sleep study: PAF, snoring  Epworth Sleepiness Score: 8  SLEEP STUDY TECHNIQUE A multi-channel overnight portable sleep study was performed. The channels recorded were: nasal airflow, thoracic respiratory movement, and oxygen saturation with a pulse oximetry. Snoring was also monitored.  MEDICATION atorvastatin (LIPITOR) 10 MG tablet ELIQUIS 5 MG TABS tablet escitalopram (LEXAPRO) 10 MG tablet flecainide (TAMBOCOR) 150 MG tablet metoprolol succinate (TOPROL-XL) 25 MG 24 hr tablet temazepam (RESTORIL) 30 MG capsule Patient self administered medications include: N/A.  SLEEP ARCHITECTURE Patient was studied for 363 minutes. The sleep efficiency was 100.0 % and the patient was supine for 0%. The arousal index was 0.0 per hour.  RESPIRATORY PARAMETERS The overall AHI was 7.3 per hour, with a central apnea index of 0 per hour.  The oxygen nadir was 89% during sleep.  CARDIAC DATA Mean heart rate during sleep was 54.6 bpm. Heart rate range: 47 - 78 bpm.  IMPRESSIONS - Mild obstructive sleep apnea occurred during this study (AHI 7.3/h). Supine sleep was absent, and the overall severity may be under estimated if supine sleep was present. - Mild oxygen desaturation to a nadir of 89%. - Patient snored 288.4 minutes (79.4%) during the sleep.  DIAGNOSIS - Obstructive Sleep Apnea (G47.33)  RECOMMENDATIONS - Therapeutic CPAP titration to determine optimal pressure required to alleviate sleep disordered breathing. Initiate Auto-PAP with EPR of 3 at 6 - 16 cm of water. - Effort should be made to optimize nasal and oropharyngeal  patency. - Positional therapy avoiding supine position during sleep. - If patient is against CPAP initiation of a customized oral appliance may be considered. - Avoid alcohol, sedatives and other CNS depressants that may worsen sleep apnea and disrupt normal sleep architecture. - Sleep hygiene should be reviewed to assess factors that may improve sleep quality. - Weight management (BMI 41) and regular exercise should be initiated or continued. - Recommend a download and sleep clinic evaluation after 4 weeks of therapy.   [Electronically signed] 09/16/2022 01:55 PM  Shelva Majestic MD, [facility], [place], American Board of Sleep Medicine  NPI: [id number]  [place] PH: [phone]   FX: [phone] [place]

## 2022-09-17 ENCOUNTER — Telehealth: Payer: Self-pay | Admitting: Cardiology

## 2022-09-17 NOTE — Telephone Encounter (Signed)
Spoke with pt wife she states that pt has been SOB for a couple weeks and the last few days he cannot walk from the living room to the kitchen, this is about 30-50 steps. His BP runs "normal" typically 120's and HR typically 48-52 BPM. They are on the road and unable to get specific BP numbers.  He did have a sleep study and has not received results. She/Pt was hoping that this study would explain the increasing SOB. I have made an appt with Dr Claiborne Billings to discuss and see next steps. She will take pt to the ER "later today".  I have made an appt for pt to see Dr Claiborne Billings 10-22-22 to discuss sleep study. Pt has appt to see Tobb 6-25 for regular 5 month f/u.

## 2022-09-17 NOTE — Telephone Encounter (Signed)
Pt c/o Shortness Of Breath: STAT if SOB developed within the last 24 hours or pt is noticeably SOB on the phone  1. Are you currently SOB (can you hear that pt is SOB on the phone)? yes  2. How long have you been experiencing SOB? Since he has had the testing, past few days it has gotten worse even just talking   3. Are you SOB when sitting or when up moving around? Both, even talking  4. Are you currently experiencing any other symptoms? Chest tightness   Pt c/o of Chest Pain: STAT if CP now or developed within 24 hours  1. Are you having CP right now? yes  2. Are you experiencing any other symptoms (ex. SOB, nausea, vomiting, sweating)? SOB  3. How long have you been experiencing CP? A couple days  4. Is your CP continuous or coming and going? Comes and goes  5. Have you taken Nitroglycerin? No   Patient's wife states the patient's SOB has gotten much worse. She says he gets out of breath just talking and has been having chest tightness.     ?

## 2022-09-24 ENCOUNTER — Telehealth: Payer: Self-pay | Admitting: *Deleted

## 2022-09-24 ENCOUNTER — Other Ambulatory Visit: Payer: Self-pay | Admitting: Cardiovascular Disease

## 2022-09-24 DIAGNOSIS — G4733 Obstructive sleep apnea (adult) (pediatric): Secondary | ICD-10-CM

## 2022-09-24 NOTE — Telephone Encounter (Signed)
Patient notified of sleep study results and recommendations. He agrees to proceed with CPAP titration pending insurance approval. Appointment scheduled with Dr Claiborne Billings for 10/22/22 was cancelled since results were given over the phone. He prefers not to have 2 appointments since he has to have a compliance visit once he gets his CPAP machine.

## 2022-10-22 ENCOUNTER — Ambulatory Visit: Payer: 59 | Admitting: Cardiovascular Disease

## 2022-11-04 ENCOUNTER — Ambulatory Visit: Payer: Self-pay | Admitting: Allergy

## 2022-11-18 ENCOUNTER — Telehealth: Payer: Self-pay | Admitting: Oncology

## 2022-11-18 NOTE — Telephone Encounter (Signed)
CT Chest has been scheduled for 3/14 @ 1pm; Check in @ 12   Notified pt of date,time and instructions.

## 2022-12-04 LAB — CBC AND DIFFERENTIAL
HCT: 42 (ref 41–53)
Hemoglobin: 13.9 (ref 13.5–17.5)
Neutrophils Absolute: 7.14
Platelets: 249 10*3/uL (ref 150–400)
WBC: 10.5

## 2022-12-04 LAB — BASIC METABOLIC PANEL
BUN: 17 (ref 4–21)
CO2: 27 — AB (ref 13–22)
Chloride: 103 (ref 99–108)
Creatinine: 0.8 (ref 0.6–1.3)
Glucose: 91
Potassium: 4.3 mEq/L (ref 3.5–5.1)
Sodium: 137 (ref 137–147)
eGFR: 60

## 2022-12-04 LAB — HEPATIC FUNCTION PANEL
ALT: 23 U/L (ref 10–40)
AST: 30 (ref 14–40)
Alkaline Phosphatase: 67 (ref 25–125)
Bilirubin, Total: 0.9

## 2022-12-04 LAB — COMPREHENSIVE METABOLIC PANEL
Albumin: 3.7 (ref 3.5–5.0)
Calcium: 8 — AB (ref 8.7–10.7)

## 2022-12-04 LAB — CBC: RBC: 4.69 (ref 3.87–5.11)

## 2022-12-04 NOTE — Progress Notes (Signed)
[facility]  [address] [place],  [place]  [address] [phone]  Clinic Day:  12/05/2022  Referring physician: Lowella Dandy, NP  HISTORY OF PRESENT ILLNESS:  The patient is a 62 y.o. male with stage IA3 (T1c N0 M0) lung adenocarcinoma, status post a right upper lobectomy in November 2020.  He comes in today for routine follow-up, as well as to review his most recent chest CT.  Since his last visit, the patient has been doing well. He has chronic dyspnea upon exertion, but denies having any new respiratory symptoms which concern him for disease recurrence.    PHYSICAL EXAM:  Blood pressure 132/64, pulse (!) 51, temperature 97.8 F (36.6 C), resp. rate 20, height 6' (1.829 m), weight (!) 302 lb 9.6 oz (137.3 kg), SpO2 94 %. Wt Readings from Last 3 Encounters:  12/05/22 (!) 302 lb 9.6 oz (137.3 kg)  09/03/22 (!) 305 lb (138.3 kg)  08/29/22 (!) 302 lb 6.4 oz (137.2 kg)   Body mass index is 41.04 kg/m. Performance status (ECOG): 0 Physical Exam Constitutional:      Appearance: Normal appearance. He is not ill-appearing.  HENT:     Mouth/Throat:     Mouth: Mucous membranes are moist.     Pharynx: Oropharynx is clear. No oropharyngeal exudate or posterior oropharyngeal erythema.  Cardiovascular:     Rate and Rhythm: Normal rate and regular rhythm.     Heart sounds: No murmur heard.    No friction rub. No gallop.  Pulmonary:     Effort: Pulmonary effort is normal. No respiratory distress.     Breath sounds: Normal breath sounds. No wheezing, rhonchi or rales.  Abdominal:     General: Bowel sounds are normal. There is no distension.     Palpations: Abdomen is soft. There is no mass.     Tenderness: There is no abdominal tenderness.  Musculoskeletal:        General: No swelling.     Right lower leg: No edema.     Left lower leg: No edema.  Lymphadenopathy:     Cervical: No cervical adenopathy.     Upper Body:     Right upper body: No  supraclavicular or axillary adenopathy.     Left upper body: No supraclavicular or axillary adenopathy.     Lower Body: No right inguinal adenopathy. No left inguinal adenopathy.  Skin:    General: Skin is warm.     Coloration: Skin is not jaundiced.     Findings: No lesion or rash.  Neurological:     General: No focal deficit present.     Mental Status: He is alert and oriented to person, place, and time. Mental status is at baseline.  Psychiatric:        Mood and Affect: Mood normal.        Behavior: Behavior normal.        Thought Content: Thought content normal.   SCANS:  His chest CT on 12-04-22 revealed the following: FINDINGS: Cardiovascular: No acute vascular findings. Advanced coronary artery atherosclerosis with lesser involvement of the aorta. The heart size is normal. There is no pericardial effusion.  Mediastinum/Nodes: There are no enlarged mediastinal, hilar or axillary lymph nodes. The thyroid gland, trachea and esophagus demonstrate no significant findings.  Lungs/Pleura: Status post right upper lobectomy. There is stable pleural thickening on the right. No pneumothorax or significant pleural effusion. Mild to moderate underlying centrilobular and paraseptal emphysema. No evidence of local  recurrence or metastatic disease.  Upper abdomen: Subjective hepatic steatosis. Prior cholecystectomy. No acute findings or evidence of metastatic disease within the visualized upper abdomen.  Musculoskeletal/Chest wall: There is no chest wall mass or suspicious osseous finding. Stable chronic right middle lobe herniation within the right anterior 3rd intercostal space.  IMPRESSION: 1. Stable chest CT status post right upper lobectomy. 2. No evidence of local recurrence or metastatic disease. 3. Stable chronic right middle lobe herniation within the right anterior 3rd intercostal space. 4. Aortic Atherosclerosis (ICD10-I70.0) and Emphysema (ICD10-J43.9).  ASSESSMENT &  PLAN:  Assessment/Plan:  [name] with stage IA3 (T1c N0 M0) lung adenocarcinoma, status post a right upper lobectomy in November 2020.   In clinic today, I went over his chest CT images done yesterday, for which he could see there are no findings which suggest disease recurrence.  From a lung cancer perspective, the patient remains disease-free.  My concern is that his shortness of breath may still be multifactorial.  From a lung cancer standpoint, I do believe the patient is doing well.  I will see him back in 6 months for repeat clinical assessment, with a chest x-ray being done on the day of his next visit for his continued lung cancer surveillance.  The patient understands all the plans discussed today and is in agreement with them.   Aaron Honea Macarthur Critchley, MD

## 2022-12-05 ENCOUNTER — Other Ambulatory Visit: Payer: Self-pay | Admitting: Oncology

## 2022-12-05 ENCOUNTER — Inpatient Hospital Stay: Payer: 59 | Attending: Oncology | Admitting: Oncology

## 2022-12-05 VITALS — BP 132/64 | HR 51 | Temp 97.8°F | Resp 20 | Ht 72.0 in | Wt 302.6 lb

## 2022-12-05 DIAGNOSIS — C3411 Malignant neoplasm of upper lobe, right bronchus or lung: Secondary | ICD-10-CM

## 2022-12-09 ENCOUNTER — Ambulatory Visit: Payer: 59 | Admitting: Oncology

## 2022-12-11 ENCOUNTER — Encounter: Payer: Self-pay | Admitting: Oncology

## 2023-03-17 ENCOUNTER — Ambulatory Visit: Payer: 59 | Attending: Cardiology | Admitting: Cardiology

## 2023-03-17 ENCOUNTER — Encounter: Payer: Self-pay | Admitting: Cardiology

## 2023-03-17 VITALS — BP 116/64 | HR 53 | Ht 72.0 in | Wt 304.2 lb

## 2023-03-17 DIAGNOSIS — I2510 Atherosclerotic heart disease of native coronary artery without angina pectoris: Secondary | ICD-10-CM | POA: Diagnosis not present

## 2023-03-17 DIAGNOSIS — I272 Pulmonary hypertension, unspecified: Secondary | ICD-10-CM

## 2023-03-17 DIAGNOSIS — I480 Paroxysmal atrial fibrillation: Secondary | ICD-10-CM

## 2023-03-17 DIAGNOSIS — Z79899 Other long term (current) drug therapy: Secondary | ICD-10-CM

## 2023-03-17 MED ORDER — POTASSIUM CHLORIDE CRYS ER 20 MEQ PO TBCR: EXTENDED_RELEASE_TABLET | ORAL | 3 refills | Status: DC

## 2023-03-17 MED ORDER — FUROSEMIDE 40 MG PO TABS: ORAL_TABLET | ORAL | 3 refills | Status: DC

## 2023-03-17 NOTE — Progress Notes (Signed)
Cardiology Office Note:    Date:  03/17/2023   ID:  Aaron [name], DOB [birth year], MRN [id number]  PCP:  Hurshel Party, NP  Cardiologist:  Thomasene Ripple, DO  Electrophysiologist:  None   Referring MD: Hurshel Party, NP   " I am doing ok - still short of breath though"  History of Present Illness:    Aaron [name] is a 62 y.o. male with a hx of mild nonobstructive coronary artery disease, paroxysmal atrial fibrillation on Eliquis and flecainide along with Toprol XL, morbid obesity, pulmonary hypertension recently noted on his right heart catheterization, hyperlipidemia, history of lung cancer and lobectomy of his upper right lobe, COPD.  At his last visit which was on August 29, 2022 at that time he was status post right and left heart catheterization since of coronary artery disease but mild pulmonary arterial hypertension.  He still complains of shortness of breath.  He had a sleep study which showed mild sleep apnea with no need for any CPAP.   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/27/2019   Anxiety    Arrhythmia    Complication of anesthesia    COPD  GOLD 0 = CT criteria only  01/26/2019   Quit smoking 2015 at wt = 170 lb  - 01/25/2019   Walked RA  2 laps @  approx 254ft each @ fast pace  stopped due to  Min sob at wt = 276  - PFT's  07/01/2019  FEV1 3.16 (81 % ) ratio 0.78  p 7 % improvement from saba p nothing prior to study with DLCO wnl and f/v curve ok,  ERV 2% c/w effects of body habitus   Coronary artery calcification seen on CAT scan 05/30/2020   Dysrhythmia    afib   Insomnia 02/16/2019   Malignant 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 (HCC) 08/12/2019   Mixed dyslipidemia    Morbid obesity (HCC) 05/30/2020   Obesity (BMI 35.0-39.9 without comorbidity) 11/29/2020   PAF (paroxysmal atrial fibrillation) (HCC) 07/27/2019   Paroxysmal atrial fibrillation (HCC)    PONV (postoperative nausea and vomiting)    S/P lobectomy of lung 08/12/2019   Solitary  pulmonary nodule on lung CT 01/26/2019    LDSCT 11/17/18 that did show 8.7 mm RUL nodule and centrilobular emphysema / lingular tree-in-bud  - repeat CT  05/19/19 increase RUL nodule to 15.6 mm  - PET 06/29/2019  The right upper lobe nodules highly hypermetabolic with maximum SUV of 11.2, most compatible with malignancy. No adenopathy or metastatic spread identified. 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 ([facility]    Past Surgical History:  Procedure Laterality Date   BACK SURGERY     CARDIOVERSION N/A 07/15/2019   Procedure: CARDIOVERSION;  Surgeon: Chrystie Nose, MD;  Location: [facility] ENDOSCOPY;  Service: Cardiovascular;  Laterality: N/A;   CHOLECYSTECTOMY     RIGHT/LEFT HEART CATH AND CORONARY ANGIOGRAPHY N/A 08/08/2022   Procedure: RIGHT/LEFT HEART CATH AND CORONARY ANGIOGRAPHY;  Surgeon: Lennette Bihari, MD;  Location: [facility] INVASIVE CV LAB;  Service: Cardiovascular;  Laterality: N/A;   TEE WITH CARDIOVERSION     TEE WITHOUT CARDIOVERSION N/A 07/15/2019   Procedure: TRANSESOPHAGEAL ECHOCARDIOGRAM (TEE);  Surgeon: Chrystie Nose, MD;  Location: [facility] ENDOSCOPY;  Service: Cardiovascular;  Laterality: N/A;   VIDEO ASSISTED THORACOSCOPY (VATS)/ LOBECTOMY  08/12/2019   Procedure: RIGHT Video Assisted Thoracoscopy (Vats) With Right Upper Lobectomy;  Surgeon: Loreli Slot,  MD;  Location: [facility] OR;  Service: Thoracic;;    Current Medications: No outpatient medications have been marked as taking for the 03/17/23 encounter (Office Visit) with Thomasene Ripple, DO.     Allergies:   Penicillins and Codeine   Social History   Socioeconomic History   Marital status: Married    Spouse name: Not on file   Number of children: Not on file   Years of education: Not on file   Highest education level: Not on file  Occupational History   Not on file  Tobacco Use   Smoking status: Former    Packs/day: 1.50    Years: 40.00    Additional pack years: 0.00    Total pack years:  60.00    Types: Cigarettes    Quit date: 09/22/2013    Years since quitting: 9.4   Smokeless tobacco: Never  Vaping Use   Vaping Use: Never used  Substance and Sexual Activity   Alcohol use: Not Currently   Drug use: Not Currently   Sexual activity: Not on file  Other Topics Concern   Not on file  Social History Narrative   Not on file   Social Determinants of Health   Financial Resource Strain: Not on file  Food Insecurity: Not on file  Transportation Needs: Not on file  Physical Activity: Not on file  Stress: Not on file  Social Connections: Not on file     Family History: The patient's family history includes Lung cancer in his brother, father, mother, and sister.  ROS:   Review of Systems  Constitution: Negative for decreased appetite, fever and weight gain.  HENT: Negative for congestion, ear discharge, hoarse voice and sore throat.   Eyes: Negative for discharge, redness, vision loss in right eye and visual halos.  Cardiovascular: Negative for chest pain, dyspnea on exertion, leg swelling, orthopnea and palpitations.  Respiratory: Reports shortness of breath negative for cough, hemoptysis, and snoring.   Endocrine: Negative for heat intolerance and polyphagia.  Hematologic/Lymphatic: Negative for bleeding problem. Does not bruise/bleed easily.  Skin: Negative for flushing, nail changes, rash and suspicious lesions.  Musculoskeletal: Negative for arthritis, joint pain, muscle cramps, myalgias, neck pain and stiffness.  Gastrointestinal: Negative for abdominal pain, bowel incontinence, diarrhea and excessive appetite.  Genitourinary: Negative for decreased libido, genital sores and incomplete emptying.  Neurological: Negative for brief paralysis, focal weakness, headaches and loss of balance.  Psychiatric/Behavioral: Negative for altered mental status, depression and suicidal ideas.  Allergic/Immunologic: Negative for HIV exposure and persistent infections.     EKGs/Labs/Other Studies Reviewed:    The following studies were reviewed today:   EKG: None today   Heart catheterization August 08, 2022   Mid LM to Dist LM lesion is 20% stenosed.   Mid LAD-1 lesion is 50% stenosed.   Mid LAD-2 lesion is 50% stenosed.   2nd Diag lesion is 50% stenosed.   Prox RCA lesion is 30% stenosed.   Mild to moderate coronary calcification.   Mild to moderate CAD with smooth 20% mid left main stenosis; 50% focal proximal LAD stenosis at the origin of the first diagonal and septal perforating artery, 50% mid and mid distal LAD stenoses; normal left circumflex coronary artery, and large dominant RCA with smooth 30% proximal to mid stenosis.   Mild right heart pressure elevation with mild pulmonary hypertension with mean PA pressure at 31 mm.   RECOMMENDATION: Patient has history of PAF.  Can resume Eliquis tomorrow.  Consider concomitant low-dose aspirin  therapy with his coronary calcification and moderate obstructive disease.  Medical therapy for CAD; consider initiation of additional anti-ischemic regimen.  Suspect sleep apnea may be contributing to his pulmonary hypertension.  He is scheduled to undergo a sleep evaluation.  Aggressive lipid-lowering therapy with target LDL less than 70.  Transthoracic echocardiogram 03/07/2019 IMPRESSIONS  1. The left ventricle has normal systolic function, with an ejection  fraction of 55-60%. The cavity size was normal. Left ventricular diastolic  Doppler parameters are indeterminate.   2. The right ventricle has normal systolic function. The cavity was  normal. There is no increase in right ventricular wall thickness.   3. Left atrial size was moderately dilated.   4. Right atrial size was mildly dilated.   5. The aortic valve has an indeterminate number of cusps. Aortic valve  regurgitation was not assessed by color flow Doppler.   FINDINGS   Left Ventricle: The left ventricle has normal systolic function, with  an  ejection fraction of 55-60%. The cavity size was normal. There is no  increase in left ventricular wall thickness. Left ventricular diastolic  Doppler parameters are indeterminate.   Right Ventricle: The right ventricle has normal systolic function. The  cavity was normal. There is no increase in right ventricular wall  thickness.   Left Atrium: Left atrial size was moderately dilated.   Right Atrium: Right atrial size was mildly dilated. Right atrial pressure  is estimated at 3 mmHg.   Interatrial Septum: No atrial level shunt detected by color flow Doppler.   Pericardium: There is no evidence of pericardial effusion.   Mitral Valve: The mitral valve is normal in structure. Mitral valve  regurgitation is mild by color flow Doppler.   Tricuspid Valve: The tricuspid valve is normal in structure. Tricuspid  valve regurgitation was not visualized by color flow Doppler.   Aortic Valve: The aortic valve has an indeterminate number of cusps Aortic  valve regurgitation was not assessed by color flow Doppler.   Pulmonic Valve: The pulmonic valve was normal in structure. Pulmonic valve  regurgitation was not assessed by color flow Doppler.   Venous: The inferior vena cava measures 1.70 cm, is normal in size with  greater than 50% respiratory variability.   Recent Labs: 12/04/2022: ALT 23; BUN 17; Creatinine 0.8; Hemoglobin 13.9; Platelets 249; Potassium 4.3; Sodium 137  Recent Lipid Panel    Component Value Date/Time   CHOL 139 11/29/2020 0837   TRIG 95 11/29/2020 0837   HDL 34 (L) 11/29/2020 0837   CHOLHDL 4.1 11/29/2020 0837   LDLCALC 87 11/29/2020 0837    Physical Exam:    VS:  BP 116/64   Pulse (!) 53   Ht 6' (1.829 m)   Wt (!) 304 lb 3.2 oz (138 kg)   SpO2 96%   BMI 41.26 kg/m     Wt Readings from Last 3 Encounters:  03/17/23 (!) 304 lb 3.2 oz (138 kg)  12/05/22 (!) 302 lb 9.6 oz (137.3 kg)  09/03/22 (!) 305 lb (138.3 kg)     GEN: Well nourished, well  developed in no acute distress HEENT: Normal NECK: No JVD; No carotid bruits LYMPHATICS: No lymphadenopathy CARDIAC: S1S2 noted,RRR, no murmurs, rubs, gallops RESPIRATORY:  Clear to auscultation without rales, wheezing or rhonchi  ABDOMEN: Soft, non-tender, non-distended, +bowel sounds, no guarding. EXTREMITIES: No edema, No cyanosis, no clubbing MUSCULOSKELETAL:  No deformity  SKIN: Warm and dry NEUROLOGIC:  Alert and oriented x 3, non-focal PSYCHIATRIC:  Normal affect, good insight  ASSESSMENT:    1. Mild pulmonary hypertension (HCC)   2. PAF (paroxysmal atrial fibrillation) (HCC)   3. Coronary artery calcification seen on CAT scan   4. Morbid obesity (HCC)     PLAN:    Clinically he still continues to be short of breath on exertion.  He does have some bilateral +2 lower extremity edema we will start cautious Lasix on the patient with Lasix 40 mg Tuesdays and Thursdays and potassium supplement.  Will follow-up and see how he does with this.  Continue flecainide, Toprol-XL as well as Eliquis for his atrial fibrillation.  The patient understands the need to lose weight with diet and exercise. We have discussed specific strategies for this.  With his mild sleep apnea we will continue to monitor.  No need at this time.  Sleep team for CPAP.  The patient is in agreement with the above plan. The patient left the office in stable condition.  The patient will follow up in 6 months or sooner if needed  Medication Adjustments/Labs and Tests Ordered: Current medicines are reviewed at length with the patient today.  Concerns regarding medicines are outlined above.  No orders of the defined types were placed in this encounter.  No orders of the defined types were placed in this encounter.   Patient Instructions  Medication Instructions:  Your physician has recommended you make the following change in your medication:  START: Lasix 40 mg Tuesday and Thursday START: Potassium 20 mEq  Tuesday and Thursday *If you need a refill on your cardiac medications before your next appointment, please call your pharmacy*   Lab Work: Your physician recommends that you have labs drawn today: BMET, Mag If you have labs (blood work) drawn today and your tests are completely normal, you will receive your results only by: MyChart Message (if you have MyChart) OR A paper copy in the mail If you have any lab test that is abnormal or we need to change your treatment, we will call you to review the results.   Testing/Procedures: None   Follow-Up: At [facility], you and your health needs are our priority.  As part of our continuing mission to provide you with exceptional heart care, we have created designated Provider Care Teams.  These Care Teams include your primary Cardiologist (physician) and Advanced Practice Providers (APPs -  Physician Assistants and Nurse Practitioners) who all work together to provide you with the care you need, when you need it.   Your next appointment:   6 month(s)  Provider:   Thomasene Ripple, DO      Adopting a Healthy Lifestyle.  Know what a healthy weight is for you (roughly BMI <25) and aim to maintain this   Aim for 7+ servings of fruits and vegetables daily   65-80+ fluid ounces of water or unsweet tea for healthy kidneys   Limit to max 1 drink of alcohol per day; avoid smoking/tobacco   Limit animal fats in diet for cholesterol and heart health - choose grass fed whenever available   Avoid highly processed foods, and foods high in saturated/trans fats   Aim for low stress - take time to unwind and care for your mental health   Aim for 150 min of moderate intensity exercise weekly for heart health, and weights twice weekly for bone health   Aim for 7-9 hours of sleep daily   When it comes to diets, agreement about the perfect plan isnt easy to find, even among the  experts. Experts at the [facility] [employer] developed  an idea known as the Healthy Eating Plate. Just imagine a plate divided into logical, healthy portions.   The emphasis is on diet quality:   Load up on vegetables and fruits - one-half of your plate: Aim for color and variety, and remember that potatoes dont count.   Go for whole grains - one-quarter of your plate: Whole wheat, barley, wheat berries, quinoa, oats, brown rice, and foods made with them. If you want pasta, go with whole wheat pasta.   Protein power - one-quarter of your plate: Fish, chicken, beans, and nuts are all healthy, versatile protein sources. Limit red meat.   The diet, however, does go beyond the plate, offering a few other suggestions.   Use healthy plant oils, such as olive, canola, soy, corn, sunflower and peanut. Check the labels, and avoid partially hydrogenated oil, which have unhealthy trans fats.   If youre thirsty, drink water. Coffee and tea are good in moderation, but skip sugary drinks and limit milk and dairy products to one or two daily servings.   The type of carbohydrate in the diet is more important than the amount. Some sources of carbohydrates, such as vegetables, fruits, whole grains, and beans-are healthier than others.   Finally, stay active  Signed, Thomasene Ripple, DO  03/17/2023 8:20 AM    [facility]

## 2023-03-17 NOTE — Patient Instructions (Addendum)
Medication Instructions:  Your physician has recommended you make the following change in your medication:  START: Lasix 40 mg (one tablet) Tuesday and Thursday START: Potassium 20 mEq (one tablet) Tuesday and Thursday *If you need a refill on your cardiac medications before your next appointment, please call your pharmacy*   Lab Work: Your physician recommends that you have labs drawn today: BMET, Mag If you have labs (blood work) drawn today and your tests are completely normal, you will receive your results only by: MyChart Message (if you have MyChart) OR A paper copy in the mail If you have any lab test that is abnormal or we need to change your treatment, we will call you to review the results.   Testing/Procedures: None   Follow-Up: At [facility], you and your health needs are our priority.  As part of our continuing mission to provide you with exceptional heart care, we have created designated Provider Care Teams.  These Care Teams include your primary Cardiologist (physician) and Advanced Practice Providers (APPs -  Physician Assistants and Nurse Practitioners) who all work together to provide you with the care you need, when you need it.   Your next appointment:   6 month(s)  Provider:   Thomasene Ripple, DO

## 2023-03-18 LAB — BASIC METABOLIC PANEL
BUN/Creatinine Ratio: 17 (ref 10–24)
BUN: 15 mg/dL (ref 8–27)
CO2: 25 mmol/L (ref 20–29)
Calcium: 9.1 mg/dL (ref 8.6–10.2)
Chloride: 100 mmol/L (ref 96–106)
Creatinine, Ser: 0.86 mg/dL (ref 0.76–1.27)
Glucose: 112 mg/dL — ABNORMAL HIGH (ref 70–99)
Potassium: 4.3 mmol/L (ref 3.5–5.2)
Sodium: 137 mmol/L (ref 134–144)
eGFR: 97 mL/min/{1.73_m2} (ref >59)

## 2023-03-18 LAB — MAGNESIUM: Magnesium: 2 mg/dL (ref 1.6–2.3)

## 2023-06-05 LAB — BASIC METABOLIC PANEL
BUN: 18 (ref 4–21)
CO2: 27 — AB (ref 13–22)
Chloride: 105 (ref 99–108)
Creatinine: 0.8 (ref 0.6–1.3)
EGFR: 60
Glucose: 122
Potassium: 4.1 meq/L (ref 3.5–5.1)
Sodium: 138 (ref 137–147)

## 2023-06-05 LAB — HEPATIC FUNCTION PANEL
ALT: 27 U/L (ref 10–40)
AST: 31 (ref 14–40)
Alkaline Phosphatase: 80 (ref 25–125)
Bilirubin, Total: 1.2

## 2023-06-05 LAB — CBC AND DIFFERENTIAL
HCT: 44 (ref 41–53)
Hemoglobin: 15 (ref 13.5–17.5)
Neutrophils Absolute: 6.37
Platelets: 257 10*3/uL (ref 150–400)
WBC: 9.1

## 2023-06-05 LAB — COMPREHENSIVE METABOLIC PANEL
Albumin: 3.8 (ref 3.5–5.0)
Calcium: 8.7 (ref 8.7–10.7)

## 2023-06-05 LAB — CBC: RBC: 4.85 (ref 3.87–5.11)

## 2023-06-07 NOTE — Progress Notes (Unsigned)
[facility]  [address] [place],  [place]  [address] [phone]  Clinic Day:  06/08/2023  Referring physician: Hurshel Party, NP  HISTORY OF PRESENT ILLNESS:  The patient is a 62 y.o. male with stage IA3 (T1c N0 M0) lung adenocarcinoma, status post a right upper lobectomy in November 2020.  He comes in today for routine follow-up, as well as to review his most recent chest CT.  Since his last visit, the patient has been doing okay. He has chronic dyspnea upon exertion, but denies having any new respiratory symptoms which concern him for disease recurrence.  However, his shortness of breath has become more problematic to where it is beginning to impact his daily quality of life more.  PHYSICAL EXAM:  Blood pressure 128/64, pulse (!) 52, temperature 98.2 F (36.8 C), resp. rate 20, height 6' (1.829 m), weight (!) 304 lb 8 oz (138.1 kg), SpO2 95%. Wt Readings from Last 3 Encounters:  06/08/23 (!) 304 lb 8 oz (138.1 kg)  03/17/23 (!) 304 lb 3.2 oz (138 kg)  12/05/22 (!) 302 lb 9.6 oz (137.3 kg)   Body mass index is 41.3 kg/m. Performance status (ECOG): 0 Physical Exam Constitutional:      Appearance: Normal appearance. He is not ill-appearing.  HENT:     Mouth/Throat:     Mouth: Mucous membranes are moist.     Pharynx: Oropharynx is clear. No oropharyngeal exudate or posterior oropharyngeal erythema.  Cardiovascular:     Rate and Rhythm: Normal rate and regular rhythm.     Heart sounds: No murmur heard.    No friction rub. No gallop.  Pulmonary:     Effort: Pulmonary effort is normal. No respiratory distress.     Breath sounds: Examination of the right-middle field reveals wheezing. Examination of the right-lower field reveals wheezing. Wheezing present. No rhonchi or rales.  Abdominal:     General: Bowel sounds are normal. There is no distension.     Palpations: Abdomen is soft. There is no mass.     Tenderness: There is no abdominal  tenderness.  Musculoskeletal:        General: No swelling.     Right lower leg: No edema.     Left lower leg: No edema.  Lymphadenopathy:     Cervical: No cervical adenopathy.     Upper Body:     Right upper body: No supraclavicular or axillary adenopathy.     Left upper body: No supraclavicular or axillary adenopathy.     Lower Body: No right inguinal adenopathy. No left inguinal adenopathy.  Skin:    General: Skin is warm.     Coloration: Skin is not jaundiced.     Findings: No lesion or rash.  Neurological:     General: No focal deficit present.     Mental Status: He is alert and oriented to person, place, and time. Mental status is at baseline.  Psychiatric:        Mood and Affect: Mood normal.        Behavior: Behavior normal.        Thought Content: Thought content normal.    SCANS:  His chest x-ray revealed the following: FINDINGS: Stable cardiomediastinal silhouette. Left lung is clear. Stable densities seen in right lung most likely related to prior right upper lobectomy. Bony thorax is unremarkable.  IMPRESSION: Stable postsurgical changes seen in right lung. No definite acute abnormality seen.  ASSESSMENT & PLAN:  Assessment/Plan:  A  62 y.o. male with stage IA3 (T1c N0 M0) lung adenocarcinoma, status post a right upper lobectomy in November 2020.   In clinic today, I went over his chest x-ray images with him, for which he could see there are no new findings which suggest disease recurrence.  From a lung cancer perspective, the patient remains disease-free.  My concern is that his shortness of breath from his emphysema remains very prominent.  As he has not been evaluated by pulmonology recently, he will be referred back to them ensure his lung health can be maximally managed medically.  From a lung cancer standpoint, I do believe the patient is doing well.  I will see him back in 6 months for repeat clinical assessment, with a chest x-ray being done on the day of his  next visit for his continued lung cancer surveillance.  The patient understands all the plans discussed today and is in agreement with them.   Dineen Conradt Kirby Funk, MD

## 2023-06-08 ENCOUNTER — Inpatient Hospital Stay: Payer: 59 | Attending: Oncology | Admitting: Oncology

## 2023-06-08 ENCOUNTER — Other Ambulatory Visit: Payer: Self-pay | Admitting: Oncology

## 2023-06-08 VITALS — BP 128/64 | HR 52 | Temp 98.2°F | Resp 20 | Ht 72.0 in | Wt 304.5 lb

## 2023-06-08 DIAGNOSIS — C3411 Malignant neoplasm of upper lobe, right bronchus or lung: Secondary | ICD-10-CM

## 2023-06-09 ENCOUNTER — Telehealth: Payer: Self-pay | Admitting: Oncology

## 2023-06-09 NOTE — Telephone Encounter (Signed)
06/09/23 Spoke with patient and confirmed next appt

## 2023-07-08 ENCOUNTER — Encounter: Payer: Self-pay | Admitting: Oncology

## 2023-08-18 ENCOUNTER — Other Ambulatory Visit: Payer: Self-pay

## 2023-08-18 ENCOUNTER — Ambulatory Visit (HOSPITAL_BASED_OUTPATIENT_CLINIC_OR_DEPARTMENT_OTHER)
Admission: EM | Admit: 2023-08-18 | Discharge: 2023-08-18 | Disposition: A | Discharge status: Home / Self Care | Payer: 59 | Attending: Internal Medicine | Admitting: Internal Medicine

## 2023-08-18 ENCOUNTER — Encounter (HOSPITAL_BASED_OUTPATIENT_CLINIC_OR_DEPARTMENT_OTHER): Payer: Self-pay

## 2023-08-18 DIAGNOSIS — R079 Chest pain, unspecified: Secondary | ICD-10-CM

## 2023-08-18 DIAGNOSIS — I517 Cardiomegaly: Secondary | ICD-10-CM

## 2023-08-18 DIAGNOSIS — R0600 Dyspnea, unspecified: Secondary | ICD-10-CM

## 2023-08-18 MED ORDER — ASPIRIN 81 MG PO CHEW
324.0000 mg | CHEWABLE_TABLET | Freq: Once | ORAL | Status: AC
  Administered 2023-08-18: 324 mg via ORAL

## 2023-08-18 NOTE — ED Notes (Signed)
Patient is being discharged from the Urgent Care and sent to the Emergency Department via EMS . Per Angie PA-C, patient is in need of higher level of care due to chest pain, shortness of breath. Patient is aware and verbalizes understanding of plan of care.  Vitals:   08/18/23 1046 08/18/23 1047  BP: 128/72   Pulse: (!) 52   Resp: (!) 26   Temp: (!) 97.5 F (36.4 C)   SpO2: 98% 98%

## 2023-08-18 NOTE — ED Triage Notes (Signed)
Onset this morning just prior to arrival. Patient arrives with rapid respirations. Diaphoretic. States, "It feels as if something is sitting on my chest." Provider and nursing present in exam room on patients arrival. Patient reports hx of CHF, lung cancer with lung resection. Hx of afib. Denies nausea.

## 2023-08-18 NOTE — ED Provider Notes (Signed)
[name]    CSN: [id number] Arrival date & time: 08/18/23  1035      History   Chief Complaint Chief Complaint  Patient presents with   Chest Pain   Shortness of Breath    HPI Aaron [name] is a 62 y.o. male.    Chest Pain Associated symptoms: diaphoresis and shortness of breath   Associated symptoms: no abdominal pain, no cough, no nausea and no vomiting   Shortness of Breath Associated symptoms: chest pain and diaphoresis   Associated symptoms: no abdominal pain, no cough, no sore throat and no vomiting   Chest pain onset just prior to arrival, states feels like something heavy sitting on his chest, associated with diaphoresis and shortness of breath.  Admits lightheadedness Denies recent illness, fever, chills, sweats, nausea, vomiting.  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/27/2019   Anxiety    Arrhythmia    Complication of anesthesia    COPD  GOLD 0 = CT criteria only  01/26/2019   Quit smoking 2015 at wt = 170 lb  - 01/25/2019   Walked RA  2 laps @  approx 298ft each @ fast pace  stopped due to  Min sob at wt = 276  - PFT's  07/01/2019  FEV1 3.16 (81 % ) ratio 0.78  p 7 % improvement from saba p nothing prior to study with DLCO wnl and f/v curve ok,  ERV 2% c/w effects of body habitus   Coronary artery calcification seen on CAT scan 05/30/2020   Dysrhythmia    afib   Insomnia 02/16/2019   Malignant 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 (HCC) 08/12/2019   Mixed dyslipidemia    Morbid obesity (HCC) 05/30/2020   Obesity (BMI 35.0-39.9 without comorbidity) 11/29/2020   PAF (paroxysmal atrial fibrillation) (HCC) 07/27/2019   Paroxysmal atrial fibrillation (HCC)    PONV (postoperative nausea and vomiting)    S/P lobectomy of lung 08/12/2019   Solitary pulmonary nodule on lung CT 01/26/2019    LDSCT 11/17/18 that did show 8.7 mm RUL nodule and centrilobular emphysema / lingular tree-in-bud  - repeat CT  05/19/19 increase RUL nodule to 15.6 mm   - PET 06/29/2019  The right upper lobe nodules highly hypermetabolic with maximum SUV of 11.2, most compatible with malignancy. No adenopathy or metastatic spread identified. 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 ([facility]    Patient Active Problem List   Diagnosis Date Noted   Mild pulmonary hypertension (HCC) 08/29/2022   Obesity (BMI 35.0-39.9 without comorbidity) 11/29/2020   Anxiety    Arrhythmia    Complication of anesthesia    Dysrhythmia    Paroxysmal atrial fibrillation (HCC)    PONV (postoperative nausea and vomiting)    Morbid obesity (HCC) 05/30/2020   Coronary artery calcification seen on CAT scan 05/30/2020   Anginal equivalent (HCC) 10/27/2019   S/P lobectomy of lung 08/12/2019   Malignant 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 (HCC) 08/12/2019   PAF (paroxysmal atrial fibrillation) (HCC) 07/27/2019   Mixed dyslipidemia 03/17/2019   Insomnia 02/16/2019   COPD  GOLD 0 = CT criteria only  01/26/2019   Solitary pulmonary nodule on lung CT 01/26/2019    Past Surgical History:  Procedure Laterality Date   BACK SURGERY     CARDIOVERSION N/A 07/15/2019   Procedure: CARDIOVERSION;  Surgeon: Chrystie Nose, MD;  Location: [facility] ENDOSCOPY;  Service: Cardiovascular;  Laterality: N/A;  CHOLECYSTECTOMY     RIGHT/LEFT HEART CATH AND CORONARY ANGIOGRAPHY N/A 08/08/2022   Procedure: RIGHT/LEFT HEART CATH AND CORONARY ANGIOGRAPHY;  Surgeon: Lennette Bihari, MD;  Location: [facility] INVASIVE CV LAB;  Service: Cardiovascular;  Laterality: N/A;   TEE WITH CARDIOVERSION     TEE WITHOUT CARDIOVERSION N/A 07/15/2019   Procedure: TRANSESOPHAGEAL ECHOCARDIOGRAM (TEE);  Surgeon: Chrystie Nose, MD;  Location: [facility] ENDOSCOPY;  Service: Cardiovascular;  Laterality: N/A;   VIDEO ASSISTED THORACOSCOPY (VATS)/ LOBECTOMY  08/12/2019   Procedure: RIGHT Video Assisted Thoracoscopy (Vats) With Right Upper Lobectomy;  Surgeon: Loreli Slot, MD;  Location[facility];  Service:  Thoracic;;       Home Medications    Prior to Admission medications   Medication Sig Start Date End Date Taking? Authorizing Provider  atorvastatin (LIPITOR) 10 MG tablet Take 1 tablet (10 mg total) by mouth in the morning. 08/29/22   Tobb, Kardie, DO  ELIQUIS 5 MG TABS tablet Take 1 tablet (5 mg total) by mouth 2 (two) times daily. 08/29/22   Tobb, Kardie, DO  escitalopram (LEXAPRO) 10 MG tablet Take 10 mg by mouth in the morning. 01/24/19   [provider]  flecainide (TAMBOCOR) 150 MG tablet Take 1 tablet (150 mg total) by mouth 2 (two) times daily. 08/29/22   Tobb, Kardie, DO  furosemide (LASIX) 40 MG tablet Take twice weekly. Take one tablet Tuesday, take one tablet Thursday. 03/19/23   Tobb, Kardie, DO  metoprolol succinate (TOPROL-XL) 25 MG 24 hr tablet Take 1 tablet (25 mg total) by mouth 2 (two) times daily. 08/29/22   Tobb, Kardie, DO  potassium chloride SA (KLOR-CON M20) 20 MEQ tablet Take twice weekly. Take one tablet Tuesday, take one tablet Thursday. 03/17/23   Tobb, Kardie, DO  temazepam (RESTORIL) 30 MG capsule Take 30 mg by mouth at bedtime. 10/29/20   [provider]    Family History Family History  Problem Relation Age of Onset   Lung cancer Mother        smoked   Lung cancer Father        smoked   Lung cancer Sister        smoked   Lung cancer Brother        smoked    Social History Social History   Tobacco Use   Smoking status: Former    Current packs/day: 0.00    Average packs/day: 1.5 packs/day for 40.0 years (60.0 ttl pk-yrs)    Types: Cigarettes    Start date: 09/22/1973    Quit date: 09/22/2013    Years since quitting: 9.9   Smokeless tobacco: Never  Vaping Use   Vaping status: Never Used  Substance Use Topics   Alcohol use: Not Currently   Drug use: Not Currently     Allergies   Penicillins and Codeine   Review of Systems Review of Systems  Constitutional:  Positive for diaphoresis. Negative for chills.  HENT:  Negative for  congestion, rhinorrhea and sore throat.   Respiratory:  Positive for shortness of breath. Negative for cough.   Cardiovascular:  Positive for chest pain.  Gastrointestinal:  Negative for abdominal pain, nausea and vomiting.  Neurological:  Positive for light-headedness.     Physical Exam Triage Vital Signs ED Triage Vitals  Encounter Vitals Group     BP 08/18/23 1046 128/72     Systolic BP Percentile --      Diastolic BP Percentile --      Pulse Rate 08/18/23  1046 (!) 52     Resp 08/18/23 1046 (!) 26     Temp 08/18/23 1046 (!) 97.5 F (36.4 C)     Temp Source 08/18/23 1046 Oral     SpO2 08/18/23 1046 98 %     Weight 08/18/23 1047 300 lb (136.1 kg)     Height --      Head Circumference --      Peak Flow --      Pain Score 08/18/23 1047 2     Pain Loc --      Pain Education --      Exclude from Growth Chart --    No data found.  Updated Vital Signs BP 128/72 (BP Location: Right Arm)   Pulse (!) 52   Temp (!) 97.5 F (36.4 C) (Oral)   Resp (!) 26   Wt 300 lb (136.1 kg)   SpO2 98%   BMI 40.69 kg/m   Visual Acuity Right Eye Distance:   Left Eye Distance:   Bilateral Distance:    Right Eye Near:   Left Eye Near:    Bilateral Near:     Physical Exam Vitals and nursing note reviewed.  Constitutional:      Appearance: He is obese. He is diaphoretic.  HENT:     Head: Normocephalic and atraumatic.  Eyes:     Pupils: Pupils are equal, round, and reactive to light.  Cardiovascular:     Rate and Rhythm: Bradycardia present.     Heart sounds: Normal heart sounds. No murmur heard. Pulmonary:     Effort: Tachypnea and respiratory distress present.     Breath sounds: Examination of the right-lower field reveals rales. Examination of the left-lower field reveals rales. Decreased breath sounds and rales present. No wheezing or rhonchi.  Abdominal:     Tenderness: There is no abdominal tenderness.  Musculoskeletal:     Cervical back: Neck supple.     Right lower leg:  Edema present.     Left lower leg: Edema present.  Skin:    General: Skin is warm.     Coloration: Skin is pale.  Neurological:     Mental Status: He is oriented to person, place, and time.      UC Treatments / Results  Labs (all labs ordered are listed, but only abnormal results are displayed) Labs Reviewed - No data to display  EKG   Radiology No results found.  Procedures Procedures (including critical care time)  Medications Ordered in UC Medications - No data to display  Initial Impression / Assessment and Plan / UC Course  I have reviewed the triage vital signs and the nursing notes.  Pertinent labs & imaging results that were available during my care of the patient were reviewed by me and considered in my medical decision making (see chart for details).   62 year old male with a history of lung cancer, A-fib, COPD, dyslipidemia presents with abrupt onset of chest pain and shortness of breath just prior to arrival.  He is in visible respiratory distress, diaphoretic, bradycardic, O2 sat is normal at 98%.  EKG was obtained shows sinus bradycardia with left axis deviation  no acute ischemic changes, normal intervals.  EMS was called, differential diagnosis includes coronary artery syndrome, congestive heart failure, pulmonary edema, PE.  He was given aspirin 324 mg to chew, and put on oxygen via nasal cannula, and IV access pain by RN.  His condition improved slightly and he was less sweaty while we waited  for EMS.  EMS arrived and transported him to ED  Final Clinical Impressions(s) / UC Diagnoses   Final diagnoses:  Chest pain, unspecified type  Dyspnea, unspecified type   Discharge Instructions   None    ED Prescriptions   None    PDMP not reviewed this encounter.   Meliton Rattan, [place] 08/18/23 1117

## 2023-08-18 NOTE — ED Notes (Signed)
EMS arrived. Report to  staff.  Significant other in room assisting with information to EMS.

## 2023-08-18 NOTE — ED Notes (Signed)
EMS made aware of transport needed to hospital.

## 2023-08-24 ENCOUNTER — Other Ambulatory Visit: Payer: Self-pay

## 2023-08-24 DIAGNOSIS — I480 Paroxysmal atrial fibrillation: Secondary | ICD-10-CM

## 2023-08-24 DIAGNOSIS — I2510 Atherosclerotic heart disease of native coronary artery without angina pectoris: Secondary | ICD-10-CM

## 2023-08-24 MED ORDER — METOPROLOL SUCCINATE ER 25 MG PO TB24: 25.0000 mg | ORAL_TABLET | Freq: Two times a day (BID) | ORAL | 2 refills | Status: DC

## 2023-08-24 MED ORDER — FLECAINIDE ACETATE 150 MG PO TABS: 150.0000 mg | ORAL_TABLET | Freq: Two times a day (BID) | ORAL | 2 refills | Status: DC

## 2023-08-24 MED ORDER — ATORVASTATIN CALCIUM 10 MG PO TABS: 10.0000 mg | ORAL_TABLET | Freq: Every morning | ORAL | 2 refills | Status: DC

## 2023-08-24 MED ORDER — ELIQUIS 5 MG PO TABS: 5.0000 mg | ORAL_TABLET | Freq: Two times a day (BID) | ORAL | 3 refills | Status: DC

## 2023-08-24 NOTE — Telephone Encounter (Signed)
Prescription refill request for Eliquis received. Indication:AFIB Last office visit:6/24 Scr:0.8  9/24 Age: 62 Weight:136.1  KG  Prescription refilled

## 2023-08-26 ENCOUNTER — Ambulatory Visit: Payer: 59 | Attending: Cardiology | Admitting: Cardiology

## 2023-08-26 ENCOUNTER — Encounter: Payer: Self-pay | Admitting: Cardiology

## 2023-08-26 ENCOUNTER — Ambulatory Visit (INDEPENDENT_AMBULATORY_CARE_PROVIDER_SITE_OTHER): Payer: 59

## 2023-08-26 VITALS — BP 106/66 | Ht 72.0 in | Wt 300.0 lb

## 2023-08-26 DIAGNOSIS — R7303 Prediabetes: Secondary | ICD-10-CM | POA: Diagnosis not present

## 2023-08-26 DIAGNOSIS — I480 Paroxysmal atrial fibrillation: Secondary | ICD-10-CM

## 2023-08-26 DIAGNOSIS — Z79899 Other long term (current) drug therapy: Secondary | ICD-10-CM | POA: Diagnosis not present

## 2023-08-26 DIAGNOSIS — I2510 Atherosclerotic heart disease of native coronary artery without angina pectoris: Secondary | ICD-10-CM

## 2023-08-26 MED ORDER — METOPROLOL SUCCINATE ER 25 MG PO TB24: 25.0000 mg | ORAL_TABLET | Freq: Every day | ORAL | 2 refills | Status: DC

## 2023-08-26 NOTE — Patient Instructions (Addendum)
Medication Instructions:  Your physician has recommended you make the following change in your medication:  CHANGE: Metoprolol Succinate (Toprol-XL) 25 mg once daily  *If you need a refill on your cardiac medications before your next appointment, please call your pharmacy*   Lab Work: HgbA1c, Flecainide If you have labs (blood work) drawn today and your tests are completely normal, you will receive your results only by: MyChart Message (if you have MyChart) OR A paper copy in the mail If you have any lab test that is abnormal or we need to change your treatment, we will call you to review the results.   Testing/Procedures: [name]- Long Term Monitor Instructions  Your physician has requested you wear a ZIO patch monitor for 14 days.  This is a single patch monitor. [employer] supplies one patch monitor per enrollment. Additional stickers are not available. Please do not apply patch if you will be having a Nuclear Stress Test,  Echocardiogram, Cardiac CT, MRI, or Chest Xray during the period you would be wearing the  monitor. The patch cannot be worn during these tests. You cannot remove and re-apply the  ZIO XT patch monitor.  Your ZIO patch monitor will be mailed 3 day USPS to your address on file. It may take 3-5 days  to receive your monitor after you have been enrolled.  Once you have received your monitor, please review the enclosed instructions. Your monitor  has already been registered assigning a specific monitor serial # to you.  Billing and Patient Assistance Program Information  We have supplied [employer] with any of your insurance information on file for billing purposes. [employer] offers a sliding scale Patient Assistance Program for patients that do not have  insurance, or whose insurance does not completely cover the cost of the ZIO monitor.  You must apply for the Patient Assistance Program to qualify for this discounted rate.  To apply, please call [employer] at [phone],  select option 4, select option 2, ask to apply for  Patient Assistance Program. Meredeth Ide will ask your household income, and how many people  are in your household. They will quote your out-of-pocket cost based on that information.  [employer] will also be able to set up a 63-month, interest-free payment plan if needed.  Applying the monitor   Shave hair from upper left chest.  Hold abrader disc by orange tab. Rub abrader in 40 strokes over the upper left chest as  indicated in your monitor instructions.  Clean area with 4 enclosed alcohol pads. Let dry.  Apply patch as indicated in monitor instructions. Patch will be placed under collarbone on left  side of chest with arrow pointing upward.  Rub patch adhesive wings for 2 minutes. Remove white label marked "1". Remove the white  label marked "2". Rub patch adhesive wings for 2 additional minutes.  While looking in a mirror, press and release button in center of patch. A small green light will  flash 3-4 times. This will be your only indicator that the monitor has been turned on.  Do not shower for the first 24 hours. You may shower after the first 24 hours.  Press the button if you feel a symptom. You will hear a small click. Record Date, Time and  Symptom in the Patient Logbook.  When you are ready to remove the patch, follow instructions on the last 2 pages of Patient  Logbook. Stick patch monitor onto the last page of Patient Logbook.  Place Patient Logbook in the  blue and white box. Use locking tab on box and tape box closed  securely. The blue and white box has prepaid postage on it. Please place it in the mailbox as  soon as possible. Your physician should have your test results approximately 7 days after the  monitor has been mailed back to [facility].  Call [facility] Customer Care at [phone] if you have questions regarding  your ZIO XT patch monitor. Call them immediately if you see an orange light blinking on your   monitor.  If your monitor falls off in less than 4 days, contact our Monitor department at [phone].  If your monitor becomes loose or falls off after 4 days call [employer] at [phone] for  suggestions on securing your monitor    Follow-Up: At [facility], you and your health needs are our priority.  As part of our continuing mission to provide you with exceptional heart care, we have created designated Provider Care Teams.  These Care Teams include your primary Cardiologist (physician) and Advanced Practice Providers (APPs -  Physician Assistants and Nurse Practitioners) who all work together to provide you with the care you need, when you need it.  We recommend signing up for the patient portal called "MyChart".  Sign up information is provided on this After Visit Summary.  MyChart is used to connect with patients for Virtual Visits (Telemedicine).  Patients are able to view lab/test results, encounter notes, upcoming appointments, etc.  Non-urgent messages can be sent to your provider as well.   To learn more about what you can do with MyChart, go to [email or web address].    Your next appointment:   6 month(s)  Provider:   Thomasene Ripple, DO

## 2023-08-26 NOTE — Progress Notes (Signed)
Cardiology Office Note:    Date:  08/26/2023   ID:  Aaron [name], DOB [birth year], MRN [id number]  PCP:  Hurshel Party, NP  Cardiologist:  Thomasene Ripple, DO  Electrophysiologist:  None   Referring MD: Hurshel Party, NP   " I am doing ok - still short of breath though"  History of Present Illness:    Aaron [name] is a 62 y.o. male with a hx of mild nonobstructive coronary artery disease, paroxysmal atrial fibrillation on Eliquis and flecainide along with Toprol XL, morbid obesity, pulmonary hypertension recently noted on his right heart catheterization, hyperlipidemia, history of lung cancer and lobectomy of his upper right lobe, COPD.  At his last visit with me he was still short of breath.  Cautiously started the patient on Lasix 40 mg uses Thursdays.  Continue his flecainide as well as his Eliquis for paroxysmal atrial fibrillation.  Since also patient he reports an episode where he had significant chest pressure and was taken to urgent care.  Provide some he got the symptoms have improved.  He tells me he is experiencing these episodes now frequently.  Past Medical History:  Diagnosis Date   Anginal equivalent (HCC) 10/27/2019   Anxiety    Arrhythmia    Complication of anesthesia    COPD  GOLD 0 = CT criteria only  01/26/2019   Quit smoking 2015 at wt = 170 lb  - 01/25/2019   Walked RA  2 laps @  approx 267ft each @ fast pace  stopped due to  Min sob at wt = 276  - PFT's  07/01/2019  FEV1 3.16 (81 % ) ratio 0.78  p 7 % improvement from saba p nothing prior to study with DLCO wnl and f/v curve ok,  ERV 2% c/w effects of body habitus   Coronary artery calcification seen on CAT scan 05/30/2020   Dysrhythmia    afib   Insomnia 02/16/2019   Malignant 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 (HCC) 08/12/2019   Mixed dyslipidemia    Morbid obesity (HCC) 05/30/2020   Obesity (BMI 35.0-39.9 without comorbidity) 11/29/2020   PAF (paroxysmal atrial fibrillation) (HCC) 07/27/2019   Paroxysmal atrial  fibrillation (HCC)    PONV (postoperative nausea and vomiting)    S/P lobectomy of lung 08/12/2019   Solitary pulmonary nodule on lung CT 01/26/2019    LDSCT 11/17/18 that did show 8.7 mm RUL nodule and centrilobular emphysema / lingular tree-in-bud  - repeat CT  05/19/19 increase RUL nodule to 15.6 mm  - PET 06/29/2019  The right upper lobe nodules highly hypermetabolic with maximum SUV of 11.2, most compatible with malignancy. No adenopathy or metastatic spread identified. 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 ([facility]    Past Surgical History:  Procedure Laterality Date   BACK SURGERY     CARDIOVERSION N/A 07/15/2019   Procedure: CARDIOVERSION;  Surgeon: Chrystie Nose, MD;  Location: [facility] ENDOSCOPY;  Service: Cardiovascular;  Laterality: N/A;   CHOLECYSTECTOMY     RIGHT/LEFT HEART CATH AND CORONARY ANGIOGRAPHY N/A 08/08/2022   Procedure: RIGHT/LEFT HEART CATH AND CORONARY ANGIOGRAPHY;  Surgeon: Lennette Bihari, MD;  Location: [facility] INVASIVE CV LAB;  Service: Cardiovascular;  Laterality: N/A;   TEE WITH CARDIOVERSION     TEE WITHOUT CARDIOVERSION N/A 07/15/2019   Procedure: TRANSESOPHAGEAL ECHOCARDIOGRAM (TEE);  Surgeon: Chrystie Nose, MD;  Location: [facility] ENDOSCOPY;  Service: Cardiovascular;  Laterality: N/A;   VIDEO ASSISTED THORACOSCOPY (VATS)/ LOBECTOMY  08/12/2019   Procedure: RIGHT Video Assisted Thoracoscopy (Vats) With Right Upper Lobectomy;  Surgeon: Loreli Slot, MD;  Location: [facility] OR;  Service: Thoracic;;    Current Medications: Current Meds  Medication Sig   atorvastatin (LIPITOR) 10 MG tablet Take 1 tablet (10 mg total) by mouth in the morning.   ELIQUIS 5 MG TABS tablet Take 1 tablet (5 mg total) by mouth 2 (two) times daily.   escitalopram (LEXAPRO) 10 MG tablet Take 10 mg by mouth in the morning.   flecainide (TAMBOCOR) 150 MG tablet Take 1 tablet (150 mg total) by mouth 2 (two) times daily.   furosemide (LASIX) 40 MG tablet Take twice  weekly. Take one tablet Tuesday, take one tablet Thursday.   metoprolol succinate (TOPROL-XL) 25 MG 24 hr tablet Take 1 tablet (25 mg total) by mouth 2 (two) times daily.   potassium chloride SA (KLOR-CON M20) 20 MEQ tablet Take twice weekly. Take one tablet Tuesday, take one tablet Thursday.   temazepam (RESTORIL) 30 MG capsule Take 30 mg by mouth at bedtime.     Allergies:   Penicillins and Codeine   Social History   Socioeconomic History   Marital status: Married    Spouse name: Not on file   Number of children: Not on file   Years of education: Not on file   Highest education level: Not on file  Occupational History   Not on file  Tobacco Use   Smoking status: Former    Current packs/day: 0.00    Average packs/day: 1.5 packs/day for 40.0 years (60.0 ttl pk-yrs)    Types: Cigarettes    Start date: 09/22/1973    Quit date: 09/22/2013    Years since quitting: 9.9   Smokeless tobacco: Never  Vaping Use   Vaping status: Never Used  Substance and Sexual Activity   Alcohol use: Not Currently   Drug use: Not Currently   Sexual activity: Not on file  Other Topics Concern   Not on file  Social History Narrative   Not on file   Social Determinants of Health   Financial Resource Strain: Not on file  Food Insecurity: Not on file  Transportation Needs: Not on file  Physical Activity: Not on file  Stress: Not on file  Social Connections: Not on file     Family History: The patient's family history includes Lung cancer in his brother, father, mother, and sister.  ROS:   Review of Systems  Constitution: Negative for decreased appetite, fever and weight gain.  HENT: Negative for congestion, ear discharge, hoarse voice and sore throat.   Eyes: Negative for discharge, redness, vision loss in right eye and visual halos.  Cardiovascular: Negative for chest pain, dyspnea on exertion, leg swelling, orthopnea and palpitations.  Respiratory: Reports shortness of breath negative for  cough, hemoptysis, and snoring.   Endocrine: Negative for heat intolerance and polyphagia.  Hematologic/Lymphatic: Negative for bleeding problem. Does not bruise/bleed easily.  Skin: Negative for flushing, nail changes, rash and suspicious lesions.  Musculoskeletal: Negative for arthritis, joint pain, muscle cramps, myalgias, neck pain and stiffness.  Gastrointestinal: Negative for abdominal pain, bowel incontinence, diarrhea and excessive appetite.  Genitourinary: Negative for decreased libido, genital sores and incomplete emptying.  Neurological: Negative for brief paralysis, focal weakness, headaches and loss of balance.  Psychiatric/Behavioral: Negative for altered mental status, depression and suicidal ideas.  Allergic/Immunologic: Negative for HIV exposure and persistent infections.    EKGs/Labs/Other Studies Reviewed:    The following studies were  reviewed today:   EKG: None today   Heart catheterization August 08, 2022   Mid LM to Dist LM lesion is 20% stenosed.   Mid LAD-1 lesion is 50% stenosed.   Mid LAD-2 lesion is 50% stenosed.   2nd Diag lesion is 50% stenosed.   Prox RCA lesion is 30% stenosed.   Mild to moderate coronary calcification.   Mild to moderate CAD with smooth 20% mid left main stenosis; 50% focal proximal LAD stenosis at the origin of the first diagonal and septal perforating artery, 50% mid and mid distal LAD stenoses; normal left circumflex coronary artery, and large dominant RCA with smooth 30% proximal to mid stenosis.   Mild right heart pressure elevation with mild pulmonary hypertension with mean PA pressure at 31 mm.   RECOMMENDATION: Patient has history of PAF.  Can resume Eliquis tomorrow.  Consider concomitant low-dose aspirin therapy with his coronary calcification and moderate obstructive disease.  Medical therapy for CAD; consider initiation of additional anti-ischemic regimen.  Suspect sleep apnea may be contributing to his pulmonary  hypertension.  He is scheduled to undergo a sleep evaluation.  Aggressive lipid-lowering therapy with target LDL less than 70.  Transthoracic echocardiogram 03/07/2019 IMPRESSIONS  1. The left ventricle has normal systolic function, with an ejection  fraction of 55-60%. The cavity size was normal. Left ventricular diastolic  Doppler parameters are indeterminate.   2. The right ventricle has normal systolic function. The cavity was  normal. There is no increase in right ventricular wall thickness.   3. Left atrial size was moderately dilated.   4. Right atrial size was mildly dilated.   5. The aortic valve has an indeterminate number of cusps. Aortic valve  regurgitation was not assessed by color flow Doppler.   FINDINGS   Left Ventricle: The left ventricle has normal systolic function, with an  ejection fraction of 55-60%. The cavity size was normal. There is no  increase in left ventricular wall thickness. Left ventricular diastolic  Doppler parameters are indeterminate.   Right Ventricle: The right ventricle has normal systolic function. The  cavity was normal. There is no increase in right ventricular wall  thickness.   Left Atrium: Left atrial size was moderately dilated.   Right Atrium: Right atrial size was mildly dilated. Right atrial pressure  is estimated at 3 mmHg.   Interatrial Septum: No atrial level shunt detected by color flow Doppler.   Pericardium: There is no evidence of pericardial effusion.   Mitral Valve: The mitral valve is normal in structure. Mitral valve  regurgitation is mild by color flow Doppler.   Tricuspid Valve: The tricuspid valve is normal in structure. Tricuspid  valve regurgitation was not visualized by color flow Doppler.   Aortic Valve: The aortic valve has an indeterminate number of cusps Aortic  valve regurgitation was not assessed by color flow Doppler.   Pulmonic Valve: The pulmonic valve was normal in structure. Pulmonic valve   regurgitation was not assessed by color flow Doppler.   Venous: The inferior vena cava measures 1.70 cm, is normal in size with  greater than 50% respiratory variability.   Recent Labs: 03/17/2023: Magnesium 2.0 06/05/2023: ALT 27; BUN 18; Creatinine 0.8; Hemoglobin 15.0; Platelets 257; Potassium 4.1; Sodium 138  Recent Lipid Panel    Component Value Date/Time   CHOL 139 11/29/2020 0837   TRIG 95 11/29/2020 0837   HDL 34 (L) 11/29/2020 0837   CHOLHDL 4.1 11/29/2020 0837   LDLCALC 87 11/29/2020 0837  Physical Exam:    VS:  BP 106/66 (BP Location: Left Arm, Patient Position: Sitting, Cuff Size: Normal)   Ht 6' (1.829 m)   Wt 300 lb (136.1 kg)   SpO2 94%   BMI 40.69 kg/m     Wt Readings from Last 3 Encounters:  08/26/23 300 lb (136.1 kg)  08/18/23 300 lb (136.1 kg)  06/08/23 (!) 304 lb 8 oz (138.1 kg)     GEN: Well nourished, well developed in no acute distress HEENT: Normal NECK: No JVD; No carotid bruits LYMPHATICS: No lymphadenopathy CARDIAC: S1S2 noted,RRR, no murmurs, rubs, gallops RESPIRATORY:  Clear to auscultation without rales, wheezing or rhonchi  ABDOMEN: Soft, non-tender, non-distended, +bowel sounds, no guarding. EXTREMITIES: No edema, No cyanosis, no clubbing MUSCULOSKELETAL:  No deformity  SKIN: Warm and dry NEUROLOGIC:  Alert and oriented x 3, non-focal PSYCHIATRIC:  Normal affect, good insight  ASSESSMENT:    1. PAF (paroxysmal atrial fibrillation) (HCC)   2. Medication management   3. Pre-diabetes   4. Coronary artery disease involving native coronary artery of native heart without angina pectoris   5. Morbid obesity (HCC)     PLAN:    Paroxysmal atrial fibrillation-today he is in junctional rhythm, heart rate 47 bpm I will pull back on his metoprolol to once daily.  Also in terms of the episode that he had I wonder if he is going in and out of atrial fibrillation so I am going to place a monitor on the patient.  For now continue flecainide,  Toprol-XL as well as Eliquis for his atrial fibrillation.  The patient understands the need to lose weight with diet and exercise. We have discussed specific strategies for this.  With his mild sleep apnea we will continue to monitor.  No need at this time.  Sleep team for CPAP.  Will get blood work today for flecainide.  His PCP also wants hemoglobin A1c he has the prescription I am going to order this.  The patient understands the need to lose weight with diet and exercise. We have discussed specific strategies for this.  The patient is in agreement with the above plan. The patient left the office in stable condition.  The patient will follow up in 6 months or sooner if needed  Medication Adjustments/Labs and Tests Ordered: Current medicines are reviewed at length with the patient today.  Concerns regarding medicines are outlined above.  Orders Placed This Encounter  Procedures   HgB A1c   Flecainide level   LONG TERM MONITOR (3-14 DAYS)   EKG 12-Lead   No orders of the defined types were placed in this encounter.   Patient Instructions  Medication Instructions:  Your physician has recommended you make the following change in your medication:  CHANGE: Metoprolol Succinate (Toprol-XL) 25 mg once daily  *If you need a refill on your cardiac medications before your next appointment, please call your pharmacy*   Lab Work: HgbA1c, Flecainide If you have labs (blood work) drawn today and your tests are completely normal, you will receive your results only by: MyChart Message (if you have MyChart) OR A paper copy in the mail If you have any lab test that is abnormal or we need to change your treatment, we will call you to review the results.   Testing/Procedures: Christena Deem- Long Term Monitor Instructions  Your physician has requested you wear a ZIO patch monitor for 14 days.  This is a single patch monitor. [employer] supplies one patch monitor per enrollment.  Additional stickers are  not available. Please do not apply patch if you will be having a Nuclear Stress Test,  Echocardiogram, Cardiac CT, MRI, or Chest Xray during the period you would be wearing the  monitor. The patch cannot be worn during these tests. You cannot remove and re-apply the  ZIO XT patch monitor.  Your ZIO patch monitor will be mailed 3 day USPS to your address on file. It may take 3-5 days  to receive your monitor after you have been enrolled.  Once you have received your monitor, please review the enclosed instructions. Your monitor  has already been registered assigning a specific monitor serial # to you.  Billing and Patient Assistance Program Information  We have supplied [employer] with any of your insurance information on file for billing purposes. [employer] offers a sliding scale Patient Assistance Program for patients that do not have  insurance, or whose insurance does not completely cover the cost of the ZIO monitor.  You must apply for the Patient Assistance Program to qualify for this discounted rate.  To apply, please call [employer] at [phone], select option 4, select option 2, ask to apply for  Patient Assistance Program. Meredeth Ide will ask your household income, and how many people  are in your household. They will quote your out-of-pocket cost based on that information.  [employer] will also be able to set up a 43-month, interest-free payment plan if needed.  Applying the monitor   Shave hair from upper left chest.  Hold abrader disc by orange tab. Rub abrader in 40 strokes over the upper left chest as  indicated in your monitor instructions.  Clean area with 4 enclosed alcohol pads. Let dry.  Apply patch as indicated in monitor instructions. Patch will be placed under collarbone on left  side of chest with arrow pointing upward.  Rub patch adhesive wings for 2 minutes. Remove white label marked "1". Remove the white  label marked "2". Rub patch adhesive wings for 2 additional minutes.   While looking in a mirror, press and release button in center of patch. A small green light will  flash 3-4 times. This will be your only indicator that the monitor has been turned on.  Do not shower for the first 24 hours. You may shower after the first 24 hours.  Press the button if you feel a symptom. You will hear a small click. Record Date, Time and  Symptom in the Patient Logbook.  When you are ready to remove the patch, follow instructions on the last 2 pages of Patient  Logbook. Stick patch monitor onto the last page of Patient Logbook.  Place Patient Logbook in the blue and white box. Use locking tab on box and tape box closed  securely. The blue and white box has prepaid postage on it. Please place it in the mailbox as  soon as possible. Your physician should have your test results approximately 7 days after the  monitor has been mailed back to [facility].  Call [facility] Customer Care at [phone] if you have questions regarding  your ZIO XT patch monitor. Call them immediately if you see an orange light blinking on your  monitor.  If your monitor falls off in less than 4 days, contact our Monitor department at [phone].  If your monitor becomes loose or falls off after 4 days call [employer] at [phone] for  suggestions on securing your monitor    Follow-Up: At [facility], you and your health needs  are our priority.  As part of our continuing mission to provide you with exceptional heart care, we have created designated Provider Care Teams.  These Care Teams include your primary Cardiologist (physician) and Advanced Practice Providers (APPs -  Physician Assistants and Nurse Practitioners) who all work together to provide you with the care you need, when you need it.  We recommend signing up for the patient portal called "MyChart".  Sign up information is provided on this After Visit Summary.  MyChart is used to connect with patients for Virtual  Visits (Telemedicine).  Patients are able to view lab/test results, encounter notes, upcoming appointments, etc.  Non-urgent messages can be sent to your provider as well.   To learn more about what you can do with MyChart, go to [email or web address].    Your next appointment:   6 month(s)  Provider:   Thomasene Ripple, DO     Adopting a Healthy Lifestyle.  Know what a healthy weight is for you (roughly BMI <25) and aim to maintain this   Aim for 7+ servings of fruits and vegetables daily   65-80+ fluid ounces of water or unsweet tea for healthy kidneys   Limit to max 1 drink of alcohol per day; avoid smoking/tobacco   Limit animal fats in diet for cholesterol and heart health - choose grass fed whenever available   Avoid highly processed foods, and foods high in saturated/trans fats   Aim for low stress - take time to unwind and care for your mental health   Aim for 150 min of moderate intensity exercise weekly for heart health, and weights twice weekly for bone health   Aim for 7-9 hours of sleep daily   When it comes to diets, agreement about the perfect plan isnt easy to find, even among the experts. Experts at the [facility] [employer] developed an idea known as the Healthy Eating Plate. Just imagine a plate divided into logical, healthy portions.   The emphasis is on diet quality:   Load up on vegetables and fruits - one-half of your plate: Aim for color and variety, and remember that potatoes dont count.   Go for whole grains - one-quarter of your plate: Whole wheat, barley, wheat berries, quinoa, oats, brown rice, and foods made with them. If you want pasta, go with whole wheat pasta.   Protein power - one-quarter of your plate: Fish, chicken, beans, and nuts are all healthy, versatile protein sources. Limit red meat.   The diet, however, does go beyond the plate, offering a few other suggestions.   Use healthy plant oils, such as olive, canola, soy, corn,  sunflower and peanut. Check the labels, and avoid partially hydrogenated oil, which have unhealthy trans fats.   If youre thirsty, drink water. Coffee and tea are good in moderation, but skip sugary drinks and limit milk and dairy products to one or two daily servings.   The type of carbohydrate in the diet is more important than the amount. Some sources of carbohydrates, such as vegetables, fruits, whole grains, and beans-are healthier than others.   Finally, stay active  Signed, Thomasene Ripple, DO  08/26/2023 9:06 AM    [facility]

## 2023-08-26 NOTE — Progress Notes (Unsigned)
Enrolled patient for a 14 day Zio XT  monitor to be mailed to patients home  °

## 2023-09-01 LAB — FLECAINIDE LEVEL: Flecainide: 0.5 ug/mL (ref 0.20–1.00)

## 2023-09-01 LAB — HEMOGLOBIN A1C
Est. average glucose Bld gHb Est-mCnc: 120 mg/dL
Hgb A1c MFr Bld: 5.8 % — ABNORMAL HIGH (ref 4.8–5.6)

## 2023-09-02 DIAGNOSIS — I480 Paroxysmal atrial fibrillation: Secondary | ICD-10-CM | POA: Diagnosis not present

## 2023-10-06 ENCOUNTER — Other Ambulatory Visit (HOSPITAL_COMMUNITY): Payer: Self-pay

## 2023-10-06 ENCOUNTER — Telehealth: Payer: Self-pay | Admitting: Pharmacy Technician

## 2023-10-06 NOTE — Telephone Encounter (Signed)
 Pharmacy Patient Advocate Encounter   Received notification from CoverMyMeds that prior authorization for Eliquis  5MG  tablets is required/requested.   Insurance verification completed.   The patient is insured through [employer] .   Per test claim: PA required; PA submitted to above mentioned insurance via CoverMyMeds Key/confirmation #/EOC [id number] Status is pending

## 2023-10-07 NOTE — Telephone Encounter (Signed)
 Called and spoke to patient. Verified name and DOB. Notified patient PA for Eliquis  approved.

## 2023-10-07 NOTE — Telephone Encounter (Signed)
 Pharmacy Patient Advocate Encounter  Received notification from [employer] that Prior Authorization for Eliquis  5MG  tablets  has been APPROVED from 10/06/23 to 10/04/24. Spoke to pharmacy to process.Copay is $35.00.    PA #/Case ID/Reference #: [id number]

## 2023-10-14 ENCOUNTER — Ambulatory Visit: Payer: Managed Care, Other (non HMO) | Admitting: Internal Medicine

## 2023-10-14 NOTE — Progress Notes (Deleted)
Aaron [name], male    DOB: December 13, 1960,     MRN: [id number]   Brief patient profile:  60  yowm quit smoking 2015 at wt 170 with gradual wt gain up to 270 range but definitely noted doe assoc with this in 2019 gradually worse since onset so referred to pulmonary clinic 01/25/2019 by Aaron [name] with abn LDSCT 11/17/18 that did show 8.7 mm RUL nodule and centrilobular emphysema / lingular tree-in-bud      History of Present Illness  01/25/2019  Pulmonary/ 1st office eval/Aaron [name] / not on inhalers at present and not really wanting to start any  Chief Complaint  Patient presents with   Pulmonary Consult    Referred by Aaron Alken, NP. Pt c/o DOE for the past year. He gets winded walking up hill or stairs.   Dyspnea:  MMRC2 = can't walk a nl pace on a flat grade s sob but does fine slow and flat / one flight sob at top s stopping / still doing yardwork for 10 min Cough: none Sleep: on side / bed is flat/ one pillow  SABA use: none  Rec - repeat CT  05/19/19 increase RUL nodule to 15.6 mm  - PET 06/29/2019  The right upper lobe nodules highly hypermetabolic with maximum SUV of 11.2, most compatible with malignancy. No adenopathy or metastatic spread identified. 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 Aaron [name]) > no RT or chemo and f/b Aaron [name] in [facility]     07/08/2021  f/u ov/Aaron [name] re: emphysema on Ct/ MPN   maint on Telegy   Chief Complaint  Patient presents with   Follow-up    SOB: during exertion, has worsened   Dyspnea:  does fine on flat surface, has to go slower than others all day long = MMRC2 = can't walk a nl pace on a flat grade s sob but does fine slow and flat  - staying bout the same - [place] in [place] walks 30 - 45 min feels holding ground but not checking 02 levels Cough: none  Sleeping: flat bed one pillow  SABA use: none  02: none  Covid status:   vax x 3   Rec Plan A = Automatic = Always=  Continue trelegy one click am   Plan B = Backup (to  supplement plan A, not to replace it) Only use your albuterol inhaler as a rescue medication  Ok to try albuterol 15 min before an activity (on alternating days)   Please schedule a follow up office visit in 6 weeks with pft's on return  - don't use any albuterol before you come in    08/29/2021  f/u ov/Aaron [name] re: GOLD 0/spn   maint on trelegy   Chief Complaint  Patient presents with   Follow-up    PFT's done today. Breathing is overall doing well. He rarely uses his rescue inhaler.   Dyspnea:  shop to house sob and it's slightly up hill  Cough: none  Sleeping: flat bed / one pillow SABA use: avg once every 2 weeks  02: none/ not checking sats  Covid status: vax x 3 / summer 2022 omicron Rec Plan A = Automatic = Always=   start Symbicort 80 Take 2 puffs first thing in am and then another 2 puffs about 12 hours later.  Work on inhaler technique:  Plan B = Backup (to supplement plan A, not to replace it) Only use your albuterol inhaler as a rescue medication  Ok to try albuterol 15 min before an activity (on alternating days)  that you know would usually make you short of breath  To get the most out of exercise, you need to be continuously aware that you are short of breath, but never out of breath, for at least 30 minutes daily.   Make sure you check your oxygen saturations at highest level of activity   Pulmonary follow up is as needed     10/14/2023 Re-establish  ov/Aaron [name] re: ***   maint on ***  No chief complaint on file.   Dyspnea:  *** Cough: *** Sleeping: *** resp cc  SABA use: *** 02: ***  Lung cancer screening :  ***    No obvious day to day or daytime variability or assoc excess/ purulent sputum or mucus plugs or hemoptysis or cp or chest tightness, subjective wheeze or overt sinus or hb symptoms.    Also denies any obvious fluctuation of symptoms with weather or environmental changes or other aggravating or alleviating factors except as outlined above   No unusual  exposure hx or h/o childhood pna/ asthma or knowledge of premature birth.  Current Allergies, Complete Past Medical History, Past Surgical History, Family History, and Social History were reviewed in [employer] record.  ROS  The following are not active complaints unless bolded Hoarseness, sore throat, dysphagia, dental problems, itching, sneezing,  nasal congestion or discharge of excess mucus or purulent secretions, ear ache,   fever, chills, sweats, unintended wt loss or wt gain, classically pleuritic or exertional cp,  orthopnea pnd or arm/hand swelling  or leg swelling, presyncope, palpitations, abdominal pain, anorexia, nausea, vomiting, diarrhea  or change in bowel habits or change in bladder habits, change in stools or change in urine, dysuria, hematuria,  rash, arthralgias, visual complaints, headache, numbness, weakness or ataxia or problems with walking or coordination,  change in mood or  memory.        No outpatient medications have been marked as taking for the 10/14/23 encounter (Appointment) with Aaron Cowden, MD.          Objective:    wts   10/14/2023       ***   08/29/2021      290   07/08/21 292 lb 12.8 oz (132.8 kg)  06/03/21 291 lb 3.2 oz (132.1 kg)  04/11/21 292 lb (132.5 kg)    Vital signs reviewed  10/14/2023  - Note at rest 02 sats  ***% on ***   General appearance:    ***            Assessment

## 2023-12-04 ENCOUNTER — Other Ambulatory Visit: Payer: Self-pay

## 2023-12-04 ENCOUNTER — Ambulatory Visit (HOSPITAL_BASED_OUTPATIENT_CLINIC_OR_DEPARTMENT_OTHER)
Admission: RE | Admit: 2023-12-04 | Discharge: 2023-12-04 | Disposition: A | Discharge status: Home / Self Care | Source: Ambulatory Visit | Attending: Oncology | Admitting: Oncology

## 2023-12-04 DIAGNOSIS — R911 Solitary pulmonary nodule: Secondary | ICD-10-CM

## 2023-12-04 DIAGNOSIS — C3411 Malignant neoplasm of upper lobe, right bronchus or lung: Secondary | ICD-10-CM | POA: Diagnosis not present

## 2023-12-04 DIAGNOSIS — Z902 Acquired absence of lung [part of]: Secondary | ICD-10-CM

## 2023-12-06 NOTE — Progress Notes (Unsigned)
 [facility]  [address] [place],  [place]  [address] [phone]  Clinic Day:  12/07/2023  Referring physician: Hurshel Party, NP  HISTORY OF PRESENT ILLNESS:  The patient is a 63 y.o. male with stage IA3 (T1c N0 M0) lung adenocarcinoma, status post a right upper lobectomy in November 2020.  He comes in today for routine follow-up, as well as to review his most recent chest x-ray.  Since his last visit, the patient has been doing okay. He has chronic dyspnea upon exertion, but denies having any new respiratory symptoms which concern him for disease recurrence.    PHYSICAL EXAM:  Blood pressure 110/69, pulse (!) 44, temperature 98.2 F (36.8 C), temperature source Oral, resp. rate 20, height 6' (1.829 m), weight 299 lb 12.8 oz (136 kg), SpO2 96%. Wt Readings from Last 3 Encounters:  12/07/23 299 lb 12.8 oz (136 kg)  08/26/23 300 lb (136.1 kg)  08/18/23 300 lb (136.1 kg)   Body mass index is 40.66 kg/m. Performance status (ECOG): 0 Physical Exam Constitutional:      Appearance: Normal appearance. He is not ill-appearing.  HENT:     Mouth/Throat:     Mouth: Mucous membranes are moist.     Pharynx: Oropharynx is clear. No oropharyngeal exudate or posterior oropharyngeal erythema.  Cardiovascular:     Rate and Rhythm: Normal rate and regular rhythm.     Heart sounds: No murmur heard.    No friction rub. No gallop.  Pulmonary:     Effort: Pulmonary effort is normal. No respiratory distress.     Breath sounds: Examination of the right-middle field reveals wheezing. Examination of the right-lower field reveals wheezing. Wheezing present. No rhonchi or rales.  Abdominal:     General: Bowel sounds are normal. There is no distension.     Palpations: Abdomen is soft. There is no mass.     Tenderness: There is no abdominal tenderness.  Musculoskeletal:        General: No swelling.     Right lower leg: No edema.     Left lower leg: No edema.   Lymphadenopathy:     Cervical: No cervical adenopathy.     Upper Body:     Right upper body: No supraclavicular or axillary adenopathy.     Left upper body: No supraclavicular or axillary adenopathy.     Lower Body: No right inguinal adenopathy. No left inguinal adenopathy.  Skin:    General: Skin is warm.     Coloration: Skin is not jaundiced.     Findings: No lesion or rash.  Neurological:     General: No focal deficit present.     Mental Status: He is alert and oriented to person, place, and time. Mental status is at baseline.  Psychiatric:        Mood and Affect: Mood normal.        Behavior: Behavior normal.        Thought Content: Thought content normal.    SCANS:  His chest x-ray from 12-04-23 showed no evidence of disease recurrence.  ASSESSMENT & PLAN:  Assessment/Plan:  A 63 y.o. male with stage IA3 (T1c N0 M0) lung adenocarcinoma, status post a right upper lobectomy in November 2020.   In clinic today, I went over his chest x-ray images with him, for which he could see there are no new findings which suggest disease recurrence.  From a lung cancer perspective, the patient remains disease-free.  From a  lung cancer standpoint, I do believe the patient is doing well.  I will see him back in November 2025 for repeat clinical assessment, with a chest CT being done a day before his next visit for his continued lung cancer surveillance.  If his chest CT remains free of disease at this time, it would mark him being 5 years cancer free for which I would consider him cured of his lung cancer.  The patient understands all the plans discussed today and is in agreement with them.   Waleed Dettman Kirby Funk, MD

## 2023-12-07 ENCOUNTER — Telehealth: Payer: Self-pay | Admitting: Oncology

## 2023-12-07 ENCOUNTER — Inpatient Hospital Stay: Payer: 59

## 2023-12-07 ENCOUNTER — Other Ambulatory Visit: Payer: Self-pay | Admitting: Oncology

## 2023-12-07 ENCOUNTER — Inpatient Hospital Stay: Payer: 59 | Attending: Oncology | Admitting: Oncology

## 2023-12-07 VITALS — BP 110/69 | HR 44 | Temp 98.2°F | Resp 20 | Ht 72.0 in | Wt 299.8 lb

## 2023-12-07 DIAGNOSIS — C3411 Malignant neoplasm of upper lobe, right bronchus or lung: Secondary | ICD-10-CM

## 2023-12-07 DIAGNOSIS — C3490 Malignant neoplasm of unspecified part of unspecified bronchus or lung: Secondary | ICD-10-CM | POA: Insufficient documentation

## 2023-12-07 LAB — CMP (CANCER CENTER ONLY)
ALT: 16 U/L (ref 0–44)
AST: 18 U/L (ref 15–41)
Albumin: 3.9 g/dL (ref 3.5–5.0)
Alkaline Phosphatase: 97 U/L (ref 38–126)
Anion gap: 8 (ref 5–15)
BUN: 20 mg/dL (ref 8–23)
CO2: 28 mmol/L (ref 22–32)
Calcium: 8.8 mg/dL — ABNORMAL LOW (ref 8.9–10.3)
Chloride: 104 mmol/L (ref 98–111)
Creatinine: 0.95 mg/dL (ref 0.61–1.24)
GFR, Estimated: >60 mL/min (ref >60)
Glucose, Bld: 74 mg/dL (ref 70–99)
Potassium: 4.7 mmol/L (ref 3.5–5.1)
Sodium: 140 mmol/L (ref 135–145)
Total Bilirubin: 0.4 mg/dL (ref 0.0–1.2)
Total Protein: 7 g/dL (ref 6.5–8.1)

## 2023-12-07 LAB — CBC WITH DIFFERENTIAL (CANCER CENTER ONLY)
Abs Immature Granulocytes: 0.05 10*3/uL (ref 0.00–0.07)
Basophils Absolute: 0.1 10*3/uL (ref 0.0–0.1)
Basophils Relative: 1 %
Eosinophils Absolute: 0.1 10*3/uL (ref 0.0–0.5)
Eosinophils Relative: 1 %
HCT: 44.4 % (ref 39.0–52.0)
Hemoglobin: 14.7 g/dL (ref 13.0–17.0)
Immature Granulocytes: 1 %
Lymphocytes Relative: 14 %
Lymphs Abs: 1.5 10*3/uL (ref 0.7–4.0)
MCH: 30.4 pg (ref 26.0–34.0)
MCHC: 33.1 g/dL (ref 30.0–36.0)
MCV: 91.7 fL (ref 80.0–100.0)
Monocytes Absolute: 1.3 10*3/uL — ABNORMAL HIGH (ref 0.1–1.0)
Monocytes Relative: 12 %
Neutro Abs: 7.5 10*3/uL (ref 1.7–7.7)
Neutrophils Relative %: 71 %
Platelet Count: 261 10*3/uL (ref 150–400)
RBC: 4.84 MIL/uL (ref 4.22–5.81)
RDW: 13 % (ref 11.5–15.5)
WBC Count: 10.6 10*3/uL — ABNORMAL HIGH (ref 4.0–10.5)
nRBC: 0 % (ref 0.0–0.2)
nRBC: 0 /100{WBCs}

## 2023-12-07 NOTE — Telephone Encounter (Signed)
 Patient has been scheduled for follow-up visit per 12/07/23 LOS.  Pt given an appt calendar with date and time.

## 2024-05-19 ENCOUNTER — Other Ambulatory Visit: Payer: Self-pay

## 2024-05-19 DIAGNOSIS — I2510 Atherosclerotic heart disease of native coronary artery without angina pectoris: Secondary | ICD-10-CM

## 2024-05-19 DIAGNOSIS — I480 Paroxysmal atrial fibrillation: Secondary | ICD-10-CM

## 2024-05-19 MED ORDER — FLECAINIDE ACETATE 150 MG PO TABS: 150.0000 mg | ORAL_TABLET | Freq: Two times a day (BID) | ORAL | 0 refills | Status: DC

## 2024-05-19 MED ORDER — METOPROLOL SUCCINATE ER 25 MG PO TB24: 25.0000 mg | ORAL_TABLET | Freq: Every day | ORAL | 0 refills | Status: DC

## 2024-06-01 ENCOUNTER — Other Ambulatory Visit: Payer: Self-pay

## 2024-06-01 MED ORDER — ATORVASTATIN CALCIUM 10 MG PO TABS: 10.0000 mg | ORAL_TABLET | Freq: Every morning | ORAL | 0 refills | Status: DC

## 2024-06-02 ENCOUNTER — Ambulatory Visit: Attending: Cardiology | Admitting: Cardiology

## 2024-06-02 ENCOUNTER — Encounter: Payer: Self-pay | Admitting: Cardiology

## 2024-06-02 ENCOUNTER — Other Ambulatory Visit: Payer: Self-pay

## 2024-06-02 VITALS — BP 104/66 | HR 51 | Ht 72.0 in | Wt 303.4 lb

## 2024-06-02 DIAGNOSIS — I2510 Atherosclerotic heart disease of native coronary artery without angina pectoris: Secondary | ICD-10-CM | POA: Diagnosis not present

## 2024-06-02 DIAGNOSIS — R001 Bradycardia, unspecified: Secondary | ICD-10-CM | POA: Diagnosis not present

## 2024-06-02 DIAGNOSIS — I480 Paroxysmal atrial fibrillation: Secondary | ICD-10-CM | POA: Diagnosis not present

## 2024-06-02 DIAGNOSIS — Z79899 Other long term (current) drug therapy: Secondary | ICD-10-CM

## 2024-06-02 MED ORDER — METOPROLOL SUCCINATE ER 25 MG PO TB24: 12.5000 mg | ORAL_TABLET | Freq: Every day | ORAL | 3 refills | Status: AC

## 2024-06-02 MED ORDER — FLECAINIDE ACETATE 150 MG PO TABS: 150.0000 mg | ORAL_TABLET | Freq: Two times a day (BID) | ORAL | 3 refills | Status: AC

## 2024-06-02 MED ORDER — ELIQUIS 5 MG PO TABS: 5.0000 mg | ORAL_TABLET | Freq: Two times a day (BID) | ORAL | 3 refills | Status: AC

## 2024-06-02 NOTE — Telephone Encounter (Signed)
 Prescription refill request for Eliquis  received. Indication:afib Last office visit:9/25 Scr:0.95  3/25 Age: 63 Weight:137.6  kg  Prescription refilled

## 2024-06-02 NOTE — Patient Instructions (Signed)
 Medication Instructions:  Your physician has recommended you make the following change in your medication:  STOP: Lasix   STOP: Potassium  DECREASE: Toprol -XL 12.5 mg once daily  *If you need a refill on your cardiac medications before your next appointment, please call your pharmacy*  Lab Work: CMET, Mag, Flecainide  If you have labs (blood work) drawn today and your tests are completely normal, you will receive your results only by: MyChart Message (if you have MyChart) OR A paper copy in the mail If you have any lab test that is abnormal or we need to change your treatment, we will call you to review the results.  Follow-Up: At [facility], you and your health needs are our priority.  As part of our continuing mission to provide you with exceptional heart care, our providers are all part of one team.  This team includes your primary Cardiologist (physician) and Advanced Practice Providers or APPs (Physician Assistants and Nurse Practitioners) who all work together to provide you with the care you need, when you need it.  Your next appointment:   1 year(s)  Provider:   Kardie Tobb, DO

## 2024-06-02 NOTE — Progress Notes (Signed)
 Cardiology Office Note:    Date:  06/02/2024   ID:  Aaron [name], DOB 04-11-1961, MRN [id number]  PCP:  Erick Greig LABOR, NP  Cardiologist:  Dub Huntsman, DO  Electrophysiologist:  None   Referring MD: Erick Greig LABOR, NP    I am doing ok - still short of breath though  History of Present Illness:    Aaron [name] is a 63 y.o. male with a hx of mild nonobstructive coronary artery disease, paroxysmal atrial fibrillation on Eliquis  and flecainide  along with Toprol  XL, morbid obesity, pulmonary hypertension recently noted on his right heart catheterization, hyperlipidemia, history of lung cancer and lobectomy of his upper right lobe, COPD.  At his last we cut his metoprolol  from twice a day to once daily.  Since I saw the patient he has retired.  He is excited about his new change.  He offers no complaints at this time.     Past Medical History:  Diagnosis Date   Anginal equivalent (HCC) 10/27/2019   Anxiety    Arrhythmia    Complication of anesthesia    COPD  GOLD 0 = CT criteria only  01/26/2019   Quit smoking 2015 at wt = 170 lb  - 01/25/2019   Walked RA  2 laps @  approx 261ft each @ fast pace  stopped due to  Min sob at wt = 276  - PFT's  07/01/2019  FEV1 3.16 (81 % ) ratio 0.78  p 7 % improvement from saba p nothing prior to study with DLCO wnl and f/v curve ok,  ERV 2% c/w effects of body habitus   Coronary artery calcification seen on CAT scan 05/30/2020   Dysrhythmia    afib   Insomnia 02/16/2019   Malignant 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 (HCC) 08/12/2019   Mixed dyslipidemia    Morbid obesity (HCC) 05/30/2020   Obesity (BMI 35.0-39.9 without comorbidity) 11/29/2020   PAF (paroxysmal atrial fibrillation) (HCC) 07/27/2019   Paroxysmal atrial fibrillation (HCC)    PONV (postoperative nausea and vomiting)    S/P lobectomy of lung 08/12/2019   Solitary pulmonary nodule on lung CT 01/26/2019    LDSCT 11/17/18 that did show 8.7 mm RUL nodule and centrilobular emphysema / lingular  tree-in-bud  - repeat CT  05/19/19 increase RUL nodule to 15.6 mm  - PET 06/29/2019  The right upper lobe nodules highly hypermetabolic with maximum SUV of 11.2, most compatible with malignancy. No adenopathy or metastatic spread identified. 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 ([facility]    Past Surgical History:  Procedure Laterality Date   BACK SURGERY     CARDIOVERSION N/A 07/15/2019   Procedure: CARDIOVERSION;  Surgeon: Mona Vinie BROCKS, MD;  Location: [facility] ENDOSCOPY;  Service: Cardiovascular;  Laterality: N/A;   CHOLECYSTECTOMY     RIGHT/LEFT HEART CATH AND CORONARY ANGIOGRAPHY N/A 08/08/2022   Procedure: RIGHT/LEFT HEART CATH AND CORONARY ANGIOGRAPHY;  Surgeon: Burnard Debby LABOR, MD;  Location: [facility] INVASIVE CV LAB;  Service: Cardiovascular;  Laterality: N/A;   TEE WITH CARDIOVERSION     TEE WITHOUT CARDIOVERSION N/A 07/15/2019   Procedure: TRANSESOPHAGEAL ECHOCARDIOGRAM (TEE);  Surgeon: Mona Vinie BROCKS, MD;  Location: [facility] ENDOSCOPY;  Service: Cardiovascular;  Laterality: N/A;   VIDEO ASSISTED THORACOSCOPY (VATS)/ LOBECTOMY  08/12/2019   Procedure: RIGHT Video Assisted Thoracoscopy (Vats) With Right Upper Lobectomy;  Surgeon: Kerrin Elspeth BROCKS, MD;  Location: [facility] OR;  Service: Thoracic;;    Current Medications: Current Meds  Medication Sig   atorvastatin  (LIPITOR) 10 MG tablet Take 1 tablet (10 mg total) by mouth in the morning.   escitalopram  (LEXAPRO ) 10 MG tablet Take 10 mg by mouth in the morning.   metoprolol  succinate (TOPROL  XL) 25 MG 24 hr tablet Take 0.5 tablets (12.5 mg total) by mouth daily.   temazepam (RESTORIL) 30 MG capsule Take 30 mg by mouth at bedtime.   [DISCONTINUED] ELIQUIS  5 MG TABS tablet Take 1 tablet (5 mg total) by mouth 2 (two) times daily.   [DISCONTINUED] flecainide  (TAMBOCOR ) 150 MG tablet Take 1 tablet (150 mg total) by mouth 2 (two) times daily.   [DISCONTINUED] metoprolol  succinate (TOPROL -XL) 25 MG 24 hr tablet Take 1 tablet  (25 mg total) by mouth daily.     Allergies:   Penicillins and Codeine   Social History   Socioeconomic History   Marital status: Married    Spouse name: Not on file   Number of children: Not on file   Years of education: Not on file   Highest education level: Not on file  Occupational History   Not on file  Tobacco Use   Smoking status: Former    Current packs/day: 0.00    Average packs/day: 1.5 packs/day for 40.0 years (60.0 ttl pk-yrs)    Types: Cigarettes    Start date: 09/22/1973    Quit date: 09/22/2013    Years since quitting: 10.7   Smokeless tobacco: Never  Vaping Use   Vaping status: Never Used  Substance and Sexual Activity   Alcohol use: Not Currently   Drug use: Not Currently   Sexual activity: Not on file  Other Topics Concern   Not on file  Social History Narrative   Not on file   Social Drivers of Health   Financial Resource Strain: Not on file  Food Insecurity: Not on file  Transportation Needs: Not on file  Physical Activity: Not on file  Stress: Not on file  Social Connections: Not on file     Family History: The patient's family history includes Lung cancer in his brother, father, mother, and sister.  ROS:   Review of Systems  Constitution: Negative for decreased appetite, fever and weight gain.  HENT: Negative for congestion, ear discharge, hoarse voice and sore throat.   Eyes: Negative for discharge, redness, vision loss in right eye and visual halos.  Cardiovascular: Negative for chest pain, dyspnea on exertion, leg swelling, orthopnea and palpitations.  Respiratory: Reports shortness of breath negative for cough, hemoptysis, and snoring.   Endocrine: Negative for heat intolerance and polyphagia.  Hematologic/Lymphatic: Negative for bleeding problem. Does not bruise/bleed easily.  Skin: Negative for flushing, nail changes, rash and suspicious lesions.  Musculoskeletal: Negative for arthritis, joint pain, muscle cramps, myalgias, neck pain  and stiffness.  Gastrointestinal: Negative for abdominal pain, bowel incontinence, diarrhea and excessive appetite.  Genitourinary: Negative for decreased libido, genital sores and incomplete emptying.  Neurological: Negative for brief paralysis, focal weakness, headaches and loss of balance.  Psychiatric/Behavioral: Negative for altered mental status, depression and suicidal ideas.  Allergic/Immunologic: Negative for HIV exposure and persistent infections.    EKGs/Labs/Other Studies Reviewed:    The following studies were reviewed today:   EKG: Sinus bradycardia, heart rate 51 beats minute   Heart catheterization August 08, 2022   Mid LM to Dist LM lesion is 20% stenosed.   Mid LAD-1 lesion is 50% stenosed.   Mid LAD-2 lesion is 50% stenosed.   2nd Diag lesion is 50%  stenosed.   Prox RCA lesion is 30% stenosed.   Mild to moderate coronary calcification.   Mild to moderate CAD with smooth 20% mid left main stenosis; 50% focal proximal LAD stenosis at the origin of the first diagonal and septal perforating artery, 50% mid and mid distal LAD stenoses; normal left circumflex coronary artery, and large dominant RCA with smooth 30% proximal to mid stenosis.   Mild right heart pressure elevation with mild pulmonary hypertension with mean PA pressure at 31 mm.   RECOMMENDATION: Patient has history of PAF.  Can resume Eliquis  tomorrow.  Consider concomitant low-dose aspirin  therapy with his coronary calcification and moderate obstructive disease.  Medical therapy for CAD; consider initiation of additional anti-ischemic regimen.  Suspect sleep apnea may be contributing to his pulmonary hypertension.  He is scheduled to undergo a sleep evaluation.  Aggressive lipid-lowering therapy with target LDL less than 70.  Transthoracic echocardiogram 03/07/2019 IMPRESSIONS  1. The left ventricle has normal systolic function, with an ejection  fraction of 55-60%. The cavity size was normal. Left  ventricular diastolic  Doppler parameters are indeterminate.   2. The right ventricle has normal systolic function. The cavity was  normal. There is no increase in right ventricular wall thickness.   3. Left atrial size was moderately dilated.   4. Right atrial size was mildly dilated.   5. The aortic valve has an indeterminate number of cusps. Aortic valve  regurgitation was not assessed by color flow Doppler.   FINDINGS   Left Ventricle: The left ventricle has normal systolic function, with an  ejection fraction of 55-60%. The cavity size was normal. There is no  increase in left ventricular wall thickness. Left ventricular diastolic  Doppler parameters are indeterminate.   Right Ventricle: The right ventricle has normal systolic function. The  cavity was normal. There is no increase in right ventricular wall  thickness.   Left Atrium: Left atrial size was moderately dilated.   Right Atrium: Right atrial size was mildly dilated. Right atrial pressure  is estimated at 3 mmHg.   Interatrial Septum: No atrial level shunt detected by color flow Doppler.   Pericardium: There is no evidence of pericardial effusion.   Mitral Valve: The mitral valve is normal in structure. Mitral valve  regurgitation is mild by color flow Doppler.   Tricuspid Valve: The tricuspid valve is normal in structure. Tricuspid  valve regurgitation was not visualized by color flow Doppler.   Aortic Valve: The aortic valve has an indeterminate number of cusps Aortic  valve regurgitation was not assessed by color flow Doppler.   Pulmonic Valve: The pulmonic valve was normal in structure. Pulmonic valve  regurgitation was not assessed by color flow Doppler.   Venous: The inferior vena cava measures 1.70 cm, is normal in size with  greater than 50% respiratory variability.   Recent Labs: 12/07/2023: ALT 16; BUN 20; Creatinine 0.95; Hemoglobin 14.7; Platelet Count 261; Potassium 4.7; Sodium 140  Recent Lipid  Panel    Component Value Date/Time   CHOL 139 11/29/2020 0837   TRIG 95 11/29/2020 0837   HDL 34 (L) 11/29/2020 0837   CHOLHDL 4.1 11/29/2020 0837   LDLCALC 87 11/29/2020 0837    Physical Exam:    VS:  BP 104/66 (BP Location: Right Arm, Patient Position: Sitting, Cuff Size: Large)   Pulse (!) 51   Ht 6' (1.829 m)   Wt (!) 303 lb 6.4 oz (137.6 kg)   SpO2 95%   BMI 41.15 kg/m  Wt Readings from Last 3 Encounters:  06/02/24 (!) 303 lb 6.4 oz (137.6 kg)  12/07/23 299 lb 12.8 oz (136 kg)  08/26/23 300 lb (136.1 kg)     GEN: Well nourished, well developed in no acute distress HEENT: Normal NECK: No JVD; No carotid bruits LYMPHATICS: No lymphadenopathy CARDIAC: S1S2 noted,RRR, no murmurs, rubs, gallops RESPIRATORY:  Clear to auscultation without rales, wheezing or rhonchi  ABDOMEN: Soft, non-tender, non-distended, +bowel sounds, no guarding. EXTREMITIES: No edema, No cyanosis, no clubbing MUSCULOSKELETAL:  No deformity  SKIN: Warm and dry NEUROLOGIC:  Alert and oriented x 3, non-focal PSYCHIATRIC:  Normal affect, good insight  ASSESSMENT:    1. PAF (paroxysmal atrial fibrillation) (HCC)   2. Medication management   3. Coronary artery disease involving native coronary artery of native heart without angina pectoris   4. Sinus bradycardia   5. Morbid obesity (HCC)     PLAN:    Paroxysmal atrial fibrillation-today he is in sinus bradycardia with intermittent junctional rhythm, heart rate is still on the lower side.  I will cut back on his Toprol  XL to 12.5 mg daily.  In terms of his coronary artery disease he reports no anginal symptoms.  Will request his blood work from his PCP to be able to review his lipid profile.  The patient understands the need to lose weight with diet and exercise. We have discussed specific strategies for this.  With his mild sleep apnea we will continue to monitor.  No need at this time.  Sleep team for CPAP.  Will get blood work today for  flecainide .  Will get CMP and mag.  He is no longer taking the Lasix  we will stop the Lasix  and potassium supplement.  The patient understands the need to lose weight with diet and exercise. We have discussed specific strategies for this.  The patient is in agreement with the above plan. The patient left the office in stable condition.  The patient will follow up in 6 months or sooner if needed  Medication Adjustments/Labs and Tests Ordered: Current medicines are reviewed at length with the patient today.  Concerns regarding medicines are outlined above.  Orders Placed This Encounter  Procedures   Comp Met (CMET)   Magnesium   Flecainide  level   EKG 12-Lead   Meds ordered this encounter  Medications   metoprolol  succinate (TOPROL  XL) 25 MG 24 hr tablet    Sig: Take 0.5 tablets (12.5 mg total) by mouth daily.    Dispense:  45 tablet    Refill:  3   flecainide  (TAMBOCOR ) 150 MG tablet    Sig: Take 1 tablet (150 mg total) by mouth 2 (two) times daily.    Dispense:  180 tablet    Refill:  3    Patient Instructions  Medication Instructions:  Your physician has recommended you make the following change in your medication:  STOP: Lasix   STOP: Potassium  DECREASE: Toprol -XL 12.5 mg once daily  *If you need a refill on your cardiac medications before your next appointment, please call your pharmacy*  Lab Work: CMET, Mag, Flecainide  If you have labs (blood work) drawn today and your tests are completely normal, you will receive your results only by: MyChart Message (if you have MyChart) OR A paper copy in the mail If you have any lab test that is abnormal or we need to change your treatment, we will call you to review the results.  Follow-Up: At [facility], you and your health  needs are our priority.  As part of our continuing mission to provide you with exceptional heart care, our providers are all part of one team.  This team includes your primary Cardiologist  (physician) and Advanced Practice Providers or APPs (Physician Assistants and Nurse Practitioners) who all work together to provide you with the care you need, when you need it.  Your next appointment:   1 year(s)  Provider:   Adrick Kestler, DO      Adopting a Healthy Lifestyle.  Know what a healthy weight is for you (roughly BMI <25) and aim to maintain this   Aim for 7+ servings of fruits and vegetables daily   65-80+ fluid ounces of water or unsweet tea for healthy kidneys   Limit to max 1 drink of alcohol per day; avoid smoking/tobacco   Limit animal fats in diet for cholesterol and heart health - choose grass fed whenever available   Avoid highly processed foods, and foods high in saturated/trans fats   Aim for low stress - take time to unwind and care for your mental health   Aim for 150 min of moderate intensity exercise weekly for heart health, and weights twice weekly for bone health   Aim for 7-9 hours of sleep daily   When it comes to diets, agreement about the perfect plan isnt easy to find, even among the experts. Experts at the [facility] [employer] developed an idea known as the Healthy Eating Plate. Just imagine a plate divided into logical, healthy portions.   The emphasis is on diet quality:   Load up on vegetables and fruits - one-half of your plate: Aim for color and variety, and remember that potatoes dont count.   Go for whole grains - one-quarter of your plate: Whole wheat, barley, wheat berries, quinoa, oats, brown rice, and foods made with them. If you want pasta, go with whole wheat pasta.   Protein power - one-quarter of your plate: Fish, chicken, beans, and nuts are all healthy, versatile protein sources. Limit red meat.   The diet, however, does go beyond the plate, offering a few other suggestions.   Use healthy plant oils, such as olive, canola, soy, corn, sunflower and peanut. Check the labels, and avoid partially hydrogenated oil,  which have unhealthy trans fats.   If youre thirsty, drink water. Coffee and tea are good in moderation, but skip sugary drinks and limit milk and dairy products to one or two daily servings.   The type of carbohydrate in the diet is more important than the amount. Some sources of carbohydrates, such as vegetables, fruits, whole grains, and beans-are healthier than others.   Finally, stay active  Signed, Dub Huntsman, DO  06/02/2024 8:50 AM    [facility]

## 2024-06-06 ENCOUNTER — Ambulatory Visit: Payer: Self-pay | Admitting: Cardiology

## 2024-06-08 LAB — COMPREHENSIVE METABOLIC PANEL WITH GFR
ALT: 20 IU/L (ref 0–44)
AST: 23 IU/L (ref 0–40)
Albumin: 3.9 g/dL (ref 3.9–4.9)
Alkaline Phosphatase: 79 IU/L (ref 44–121)
BUN/Creatinine Ratio: 14 (ref 10–24)
BUN: 12 mg/dL (ref 8–27)
Bilirubin Total: 1.2 mg/dL (ref 0.0–1.2)
CO2: 21 mmol/L (ref 20–29)
Calcium: 8.8 mg/dL (ref 8.6–10.2)
Chloride: 102 mmol/L (ref 96–106)
Creatinine, Ser: 0.87 mg/dL (ref 0.76–1.27)
Globulin, Total: 2.9 g/dL (ref 1.5–4.5)
Glucose: 100 mg/dL — ABNORMAL HIGH (ref 70–99)
Potassium: 4 mmol/L (ref 3.5–5.2)
Sodium: 139 mmol/L (ref 134–144)
Total Protein: 6.8 g/dL (ref 6.0–8.5)
eGFR: 96 mL/min/1.73 (ref >59)

## 2024-06-08 LAB — FLECAINIDE LEVEL

## 2024-06-08 LAB — MAGNESIUM: Magnesium: 2 mg/dL (ref 1.6–2.3)

## 2024-07-12 DIAGNOSIS — Z79899 Other long term (current) drug therapy: Secondary | ICD-10-CM | POA: Diagnosis not present

## 2024-07-12 DIAGNOSIS — Z85118 Personal history of other malignant neoplasm of bronchus and lung: Secondary | ICD-10-CM | POA: Diagnosis not present

## 2024-07-12 DIAGNOSIS — S86812A Strain of other muscle(s) and tendon(s) at lower leg level, left leg, initial encounter: Secondary | ICD-10-CM | POA: Diagnosis not present

## 2024-07-12 DIAGNOSIS — Z7901 Long term (current) use of anticoagulants: Secondary | ICD-10-CM | POA: Diagnosis not present

## 2024-07-12 DIAGNOSIS — F5104 Psychophysiologic insomnia: Secondary | ICD-10-CM | POA: Diagnosis not present

## 2024-07-12 DIAGNOSIS — R5383 Other fatigue: Secondary | ICD-10-CM | POA: Diagnosis not present

## 2024-07-12 DIAGNOSIS — S86112A Strain of other muscle(s) and tendon(s) of posterior muscle group at lower leg level, left leg, initial encounter: Secondary | ICD-10-CM | POA: Diagnosis not present

## 2024-07-12 DIAGNOSIS — M7662 Achilles tendinitis, left leg: Secondary | ICD-10-CM | POA: Diagnosis not present

## 2024-07-12 DIAGNOSIS — I480 Paroxysmal atrial fibrillation: Secondary | ICD-10-CM | POA: Diagnosis not present

## 2024-07-12 DIAGNOSIS — J449 Chronic obstructive pulmonary disease, unspecified: Secondary | ICD-10-CM | POA: Diagnosis not present

## 2024-07-12 DIAGNOSIS — Z6841 Body Mass Index (BMI) 40.0 and over, adult: Secondary | ICD-10-CM | POA: Diagnosis not present

## 2024-07-12 DIAGNOSIS — R7303 Prediabetes: Secondary | ICD-10-CM | POA: Diagnosis not present

## 2024-07-12 DIAGNOSIS — E66813 Obesity, class 3: Secondary | ICD-10-CM | POA: Diagnosis not present

## 2024-07-12 DIAGNOSIS — F410 Panic disorder [episodic paroxysmal anxiety] without agoraphobia: Secondary | ICD-10-CM | POA: Diagnosis not present

## 2024-07-12 DIAGNOSIS — E785 Hyperlipidemia, unspecified: Secondary | ICD-10-CM | POA: Diagnosis not present

## 2024-07-12 DIAGNOSIS — X500XXA Overexertion from strenuous movement or load, initial encounter: Secondary | ICD-10-CM | POA: Diagnosis not present

## 2024-08-03 ENCOUNTER — Inpatient Hospital Stay: Payer: Self-pay | Attending: Oncology

## 2024-08-03 ENCOUNTER — Ambulatory Visit (HOSPITAL_BASED_OUTPATIENT_CLINIC_OR_DEPARTMENT_OTHER)
Admission: RE | Admit: 2024-08-03 | Discharge: 2024-08-03 | Disposition: A | Discharge status: Home / Self Care | Source: Ambulatory Visit | Attending: Oncology | Admitting: Oncology

## 2024-08-03 DIAGNOSIS — C3411 Malignant neoplasm of upper lobe, right bronchus or lung: Secondary | ICD-10-CM

## 2024-08-03 DIAGNOSIS — Z902 Acquired absence of lung [part of]: Secondary | ICD-10-CM | POA: Diagnosis not present

## 2024-08-03 DIAGNOSIS — I700 Atherosclerosis of aorta: Secondary | ICD-10-CM | POA: Diagnosis not present

## 2024-08-03 DIAGNOSIS — Z85118 Personal history of other malignant neoplasm of bronchus and lung: Secondary | ICD-10-CM | POA: Diagnosis not present

## 2024-08-03 DIAGNOSIS — J439 Emphysema, unspecified: Secondary | ICD-10-CM | POA: Diagnosis not present

## 2024-08-03 LAB — CBC WITH DIFFERENTIAL (CANCER CENTER ONLY)
Abs Immature Granulocytes: 0.05 K/uL (ref 0.00–0.07)
Basophils Absolute: 0.1 K/uL (ref 0.0–0.1)
Basophils Relative: 1 %
Eosinophils Absolute: 0.3 K/uL (ref 0.0–0.5)
Eosinophils Relative: 3 %
HCT: 43.2 % (ref 39.0–52.0)
Hemoglobin: 14.3 g/dL (ref 13.0–17.0)
Immature Granulocytes: 1 %
Lymphocytes Relative: 15 %
Lymphs Abs: 1.3 K/uL (ref 0.7–4.0)
MCH: 29.6 pg (ref 26.0–34.0)
MCHC: 33.1 g/dL (ref 30.0–36.0)
MCV: 89.4 fL (ref 80.0–100.0)
Monocytes Absolute: 1.2 K/uL — ABNORMAL HIGH (ref 0.1–1.0)
Monocytes Relative: 14 %
Neutro Abs: 6 K/uL (ref 1.7–7.7)
Neutrophils Relative %: 66 %
Platelet Count: 277 K/uL (ref 150–400)
RBC: 4.83 MIL/uL (ref 4.22–5.81)
RDW: 12.7 % (ref 11.5–15.5)
WBC Count: 9 K/uL (ref 4.0–10.5)
nRBC: 0 % (ref 0.0–0.2)

## 2024-08-03 LAB — CMP (CANCER CENTER ONLY)
ALT: 22 U/L (ref 0–44)
AST: 28 U/L (ref 15–41)
Albumin: 3.7 g/dL (ref 3.5–5.0)
Alkaline Phosphatase: 76 U/L (ref 38–126)
Anion gap: 10 (ref 5–15)
BUN: 14 mg/dL (ref 8–23)
CO2: 25 mmol/L (ref 22–32)
Calcium: 8.9 mg/dL (ref 8.9–10.3)
Chloride: 101 mmol/L (ref 98–111)
Creatinine: 0.87 mg/dL (ref 0.61–1.24)
GFR, Estimated: >60 mL/min (ref >60)
Glucose, Bld: 84 mg/dL (ref 70–99)
Potassium: 4.4 mmol/L (ref 3.5–5.1)
Sodium: 137 mmol/L (ref 135–145)
Total Bilirubin: 0.9 mg/dL (ref 0.0–1.2)
Total Protein: 7.3 g/dL (ref 6.5–8.1)

## 2024-08-03 MED ORDER — IOHEXOL 300 MG/ML  SOLN
100.0000 mL | Freq: Once | INTRAMUSCULAR | Status: AC | PRN
  Administered 2024-08-03: 80 mL via INTRAVENOUS

## 2024-08-08 ENCOUNTER — Other Ambulatory Visit

## 2024-08-08 NOTE — Progress Notes (Unsigned)
 [facility]  [address] [place],  [place]  [address] [phone]  Clinic Day:  08/08/2024  Referring physician: Erick Greig LABOR, NP  HISTORY OF PRESENT ILLNESS:  The patient is a 63 y.o. male with stage IA3 (T1c N0 M0) lung adenocarcinoma, status post a right upper lobectomy in November 2020.  He comes in today for routine follow-up, as well as to review his most recent chest x-ray.  Since his last visit, the patient has been doing okay. He has chronic dyspnea upon exertion, but denies having any new respiratory symptoms which concern him for disease recurrence.    PHYSICAL EXAM:  There were no vitals taken for this visit. Wt Readings from Last 3 Encounters:  06/02/24 (!) 303 lb 6.4 oz (137.6 kg)  12/07/23 299 lb 12.8 oz (136 kg)  08/26/23 300 lb (136.1 kg)   There is no height or weight on file to calculate BMI. Performance status (ECOG): 0 Physical Exam Constitutional:      Appearance: Normal appearance. He is not ill-appearing.  HENT:     Mouth/Throat:     Mouth: Mucous membranes are moist.     Pharynx: Oropharynx is clear. No oropharyngeal exudate or posterior oropharyngeal erythema.  Cardiovascular:     Rate and Rhythm: Normal rate and regular rhythm.     Heart sounds: No murmur heard.    No friction rub. No gallop.  Pulmonary:     Effort: Pulmonary effort is normal. No respiratory distress.     Breath sounds: Examination of the right-middle field reveals wheezing. Examination of the right-lower field reveals wheezing. Wheezing present. No rhonchi or rales.  Abdominal:     General: Bowel sounds are normal. There is no distension.     Palpations: Abdomen is soft. There is no mass.     Tenderness: There is no abdominal tenderness.  Musculoskeletal:        General: No swelling.     Right lower leg: No edema.     Left lower leg: No edema.  Lymphadenopathy:     Cervical: No cervical adenopathy.     Upper Body:     Right upper body: No  supraclavicular or axillary adenopathy.     Left upper body: No supraclavicular or axillary adenopathy.     Lower Body: No right inguinal adenopathy. No left inguinal adenopathy.  Skin:    General: Skin is warm.     Coloration: Skin is not jaundiced.     Findings: No lesion or rash.  Neurological:     General: No focal deficit present.     Mental Status: He is alert and oriented to person, place, and time. Mental status is at baseline.  Psychiatric:        Mood and Affect: Mood normal.        Behavior: Behavior normal.        Thought Content: Thought content normal.    SCANS:  His chest CT from 08-03-24 revealed the following:  FINDINGS:   MEDIASTINUM: Substantial atheromatous vascular calcifications in the left anterior descending and right coronary artery. Mild atheromatous vascular calcification of the aortic arch. Pericardium is unremarkable. The central airways are clear.   LYMPH NODES: No mediastinal, hilar or axillary lymphadenopathy.   LUNGS AND PLEURA: Right upper lobectomy. Resulting in volume loss in the right hemithorax. Mild stable scarring anteriorly in the right middle lobe emphysema. No focal consolidation or pulmonary edema. No pleural effusion or pneumothorax.   SOFT TISSUES/BONES: No  acute abnormality of the bones or soft tissues.   UPPER ABDOMEN: Cholecystectomy. Limited images of the upper abdomen demonstrates no acute abnormality.   IMPRESSION: 1. No evidence of metastatic disease. 2. Status post right upper lobectomy with expected right hemithorax volume loss and mild stable scarring in the right middle lobe. 3. Emphysema. 4. Coronary and aortic atherosclerosis. ASSESSMENT & PLAN:  Assessment/Plan:  A 63 y.o. male with stage IA3 (T1c N0 M0) lung adenocarcinoma, status post a right upper lobectomy in November 2020.   In clinic today, I went over his chest x-ray images with him, for which he could see there are no new findings which suggest disease  recurrence.  From a lung cancer perspective, the patient remains disease-free.  From a lung cancer standpoint, I do believe the patient is doing well.  I will see him back in November 2025 for repeat clinical assessment, with a chest CT being done a day before his next visit for his continued lung cancer surveillance.  If his chest CT remains free of disease at this time, it would mark him being 5 years cancer free for which I would consider him cured of his lung cancer.  The patient understands all the plans discussed today and is in agreement with them.   Alayja Armas DELENA Kerns, MD

## 2024-08-09 ENCOUNTER — Inpatient Hospital Stay (HOSPITAL_BASED_OUTPATIENT_CLINIC_OR_DEPARTMENT_OTHER): Admitting: Oncology

## 2024-08-09 VITALS — BP 129/77 | HR 61 | Temp 98.1°F | Resp 16 | Ht 72.0 in | Wt 294.0 lb

## 2024-08-09 DIAGNOSIS — C3411 Malignant neoplasm of upper lobe, right bronchus or lung: Secondary | ICD-10-CM | POA: Diagnosis not present

## 2024-08-15 ENCOUNTER — Other Ambulatory Visit: Payer: Self-pay

## 2024-08-22 MED ORDER — ATORVASTATIN CALCIUM 10 MG PO TABS: 10.0000 mg | ORAL_TABLET | Freq: Every morning | ORAL | 2 refills | Status: AC
